# Patient Record
Sex: Female | Born: 1990 | Race: Black or African American | Hispanic: No | Marital: Single | Smoking: Current every day smoker
Health system: Southern US, Community
[De-identification: ages and names within clinical notes are randomized; demographics above are authoritative.]

## PROBLEM LIST (undated history)

## (undated) ENCOUNTER — Inpatient Hospital Stay (HOSPITAL_COMMUNITY): Payer: Self-pay

## (undated) DIAGNOSIS — K429 Umbilical hernia without obstruction or gangrene: Secondary | ICD-10-CM

## (undated) DIAGNOSIS — I10 Essential (primary) hypertension: Secondary | ICD-10-CM

## (undated) DIAGNOSIS — J45909 Unspecified asthma, uncomplicated: Secondary | ICD-10-CM

## (undated) DIAGNOSIS — N83209 Unspecified ovarian cyst, unspecified side: Secondary | ICD-10-CM

## (undated) DIAGNOSIS — R569 Unspecified convulsions: Secondary | ICD-10-CM

## (undated) HISTORY — DX: Unspecified ovarian cyst, unspecified side: N83.209

## (undated) HISTORY — PX: NO PAST SURGERIES: SHX2092

---

## 2014-09-04 ENCOUNTER — Encounter (HOSPITAL_COMMUNITY): Payer: Self-pay | Admitting: *Deleted

## 2014-09-04 ENCOUNTER — Inpatient Hospital Stay (HOSPITAL_COMMUNITY): Payer: Medicaid Other

## 2014-09-04 ENCOUNTER — Inpatient Hospital Stay (HOSPITAL_COMMUNITY)
Admission: AD | Admit: 2014-09-04 | Discharge: 2014-09-04 | Disposition: A | Discharge status: Home / Self Care | Payer: Medicaid Other | Source: Ambulatory Visit | Attending: Obstetrics & Gynecology | Admitting: Obstetrics & Gynecology

## 2014-09-04 DIAGNOSIS — O9989 Other specified diseases and conditions complicating pregnancy, childbirth and the puerperium: Secondary | ICD-10-CM

## 2014-09-04 DIAGNOSIS — R109 Unspecified abdominal pain: Secondary | ICD-10-CM | POA: Diagnosis present

## 2014-09-04 DIAGNOSIS — O26899 Other specified pregnancy related conditions, unspecified trimester: Secondary | ICD-10-CM

## 2014-09-04 HISTORY — DX: Unspecified asthma, uncomplicated: J45.909

## 2014-09-04 LAB — WET PREP, GENITAL
Trich, Wet Prep: NONE SEEN
Yeast Wet Prep HPF POC: NONE SEEN

## 2014-09-04 LAB — URINALYSIS, ROUTINE W REFLEX MICROSCOPIC
Bilirubin Urine: NEGATIVE
Glucose, UA: NEGATIVE mg/dL
Ketones, ur: NEGATIVE mg/dL
Nitrite: NEGATIVE
Protein, ur: NEGATIVE mg/dL
SPECIFIC GRAVITY, URINE: 1.015 (ref 1.005–1.030)
UROBILINOGEN UA: 0.2 mg/dL (ref 0.0–1.0)
pH: 6 (ref 5.0–8.0)

## 2014-09-04 LAB — CBC
HCT: 36.1 % (ref 36.0–46.0)
Hemoglobin: 13.1 g/dL (ref 12.0–15.0)
MCH: 31.5 pg (ref 26.0–34.0)
MCHC: 36.3 g/dL — ABNORMAL HIGH (ref 30.0–36.0)
MCV: 86.8 fL (ref 78.0–100.0)
PLATELETS: 284 10*3/uL (ref 150–400)
RBC: 4.16 MIL/uL (ref 3.87–5.11)
RDW: 12.4 % (ref 11.5–15.5)
WBC: 11 10*3/uL — ABNORMAL HIGH (ref 4.0–10.5)

## 2014-09-04 LAB — URINE MICROSCOPIC-ADD ON

## 2014-09-04 LAB — ABO/RH: ABO/RH(D): O POS

## 2014-09-04 LAB — HIV ANTIBODY (ROUTINE TESTING W REFLEX): HIV 1&2 Ab, 4th Generation: NONREACTIVE

## 2014-09-04 LAB — HCG, QUANTITATIVE, PREGNANCY: hCG, Beta Chain, Quant, S: 56029 m[IU]/mL — ABNORMAL HIGH (ref <5)

## 2014-09-04 LAB — POCT PREGNANCY, URINE: Preg Test, Ur: POSITIVE — AB

## 2014-09-04 NOTE — MAU Note (Signed)
Pt had +HPT and been having low abd pain for the past three days. Denies dysuria, vag bleeding or discharge.  Took pamprin for the pain and it did not help.

## 2014-09-04 NOTE — MAU Provider Note (Signed)
Chief Complaint: No chief complaint on file.   First Provider Initiated Contact with Patient 09/04/14 0245     SUBJECTIVE HPI: Jackie Newman is a 23 y.o. G1P0 at 6w by LMP who presents to maternity admissions reporting positive HPT 1 week ago and abdominal cramping x 3 days.  She denies LOF, vaginal bleeding, vaginal itching/burning, urinary symptoms, h/a, dizziness, n/v, or fever/chills.    Past Medical History  Diagnosis Date  . Asthma    Past Surgical History  Procedure Laterality Date  . No past surgeries     History   Social History  . Marital Status: Single    Spouse Name: N/A    Number of Children: N/A  . Years of Education: N/A   Occupational History  . Not on file.   Social History Main Topics  . Smoking status: Never Smoker   . Smokeless tobacco: Not on file  . Alcohol Use: No  . Drug Use: No  . Sexual Activity: Yes     Comment: last ilntercourse 6 weeks ago   Other Topics Concern  . Not on file   Social History Narrative  . No narrative on file   No current facility-administered medications on file prior to encounter.   No current outpatient prescriptions on file prior to encounter.   No Known Allergies  ROS: Pertinent items in HPI  OBJECTIVE Last menstrual period 07/19/2014. GENERAL: Well-developed, well-nourished female in no acute distress.  HEENT: Normocephalic HEART: normal rate RESP: normal effort ABDOMEN: Soft, non-tender EXTREMITIES: Nontender, no edema NEURO: Alert and oriented Pelvic exam: Cervix pink, visually closed, without lesion, scant white creamy discharge, vaginal walls and external genitalia normal Bimanual exam: Cervix 0/long/high, firm, anterior, neg CMT, uterus nontender, nonenlarged, adnexa without tenderness, enlargement, or mass  LAB RESULTS Results for orders placed during the hospital encounter of 09/04/14 (from the past 24 hour(s))  URINALYSIS, ROUTINE W REFLEX MICROSCOPIC     Status: Abnormal   Collection Time     09/04/14  1:00 AM      Result Value Ref Range   Color, Urine YELLOW  YELLOW   APPearance CLEAR  CLEAR   Specific Gravity, Urine 1.015  1.005 - 1.030   pH 6.0  5.0 - 8.0   Glucose, UA NEGATIVE  NEGATIVE mg/dL   Hgb urine dipstick TRACE (*) NEGATIVE   Bilirubin Urine NEGATIVE  NEGATIVE   Ketones, ur NEGATIVE  NEGATIVE mg/dL   Protein, ur NEGATIVE  NEGATIVE mg/dL   Urobilinogen, UA 0.2  0.0 - 1.0 mg/dL   Nitrite NEGATIVE  NEGATIVE   Leukocytes, UA TRACE (*) NEGATIVE  URINE MICROSCOPIC-ADD ON     Status: Abnormal   Collection Time    09/04/14  1:00 AM      Result Value Ref Range   Squamous Epithelial / LPF FEW (*) RARE   WBC, UA 0-2  <3 WBC/hpf   Bacteria, UA FEW (*) RARE   Urine-Other MUCOUS PRESENT    POCT PREGNANCY, URINE     Status: Abnormal   Collection Time    09/04/14  1:12 AM      Result Value Ref Range   Preg Test, Ur POSITIVE (*) NEGATIVE  WET PREP, GENITAL     Status: Abnormal   Collection Time    09/04/14  2:45 AM      Result Value Ref Range   Yeast Wet Prep HPF POC NONE SEEN  NONE SEEN   Trich, Wet Prep NONE SEEN  NONE SEEN  Clue Cells Wet Prep HPF POC FEW (*) NONE SEEN   WBC, Wet Prep HPF POC FEW (*) NONE SEEN  CBC     Status: Abnormal   Collection Time    09/04/14  3:15 AM      Result Value Ref Range   WBC 11.0 (*) 4.0 - 10.5 K/uL   RBC 4.16  3.87 - 5.11 MIL/uL   Hemoglobin 13.1  12.0 - 15.0 g/dL   HCT 46.9  62.9 - 52.8 %   MCV 86.8  78.0 - 100.0 fL   MCH 31.5  26.0 - 34.0 pg   MCHC 36.3 (*) 30.0 - 36.0 g/dL   RDW 41.3  24.4 - 01.0 %   Platelets 284  150 - 400 K/uL  HCG, QUANTITATIVE, PREGNANCY     Status: Abnormal   Collection Time    09/04/14  3:15 AM      Result Value Ref Range   hCG, Beta Chain, Sharene Butters, [place] [address] (*) <5 mIU/mL  ABO/RH     Status: None   Collection Time    09/04/14  3:15 AM      Result Value Ref Range   ABO/RH(D) O POS      IMAGING US Ob Comp Less 14 Wks  09/04/2014   CLINICAL DATA:  Acute onset of lower abdominal pain for  3 days. Initial encounter.  EXAM: OBSTETRIC <14 WK [place] AND TRANSVAGINAL OB US  TECHNIQUE: Both transabdominal and transvaginal ultrasound examinations were performed for complete evaluation of the gestation as well as the maternal uterus, adnexal regions, and pelvic cul-de-sac. Transvaginal technique was performed to assess early pregnancy.  COMPARISON:  None.  FINDINGS: Intrauterine gestational sac: Visualized/normal in shape.  Yolk sac:  Yes  Embryo:  Yes  Cardiac Activity: Yes  Heart Rate: 167 bpm  CRL:   1.62 cm   8 w 1 d                  [place] EDC: 04/15/2015  Maternal uterus/adnexae: The uterus is unremarkable in appearance. No subchorionic hemorrhage is seen.  The ovaries are within normal limits. The right ovary measures 3.1 x 1.9 x 1.8 cm, while the left ovary measures 3.2 x 1.5 x 1.8 cm. No suspicious adnexal masses are seen; there is no evidence for ovarian torsion.  No free fluid is seen within the pelvic cul-de-sac.  IMPRESSION: Single live intrauterine pregnancy noted, with a crown-rump length of 1.6 cm, corresponding to a gestational age of [redacted] weeks 1 day. This does not match the gestational age by LMP, and reflects a new estimated date of delivery of Apr 15, 2015.   Electronically Signed   By: Roanna Raider M.D.   On: 09/04/2014 04:33   US Ob Transvaginal  09/04/2014   CLINICAL DATA:  Acute onset of lower abdominal pain for 3 days. Initial encounter.  EXAM: OBSTETRIC <14 WK [place] AND TRANSVAGINAL OB US  TECHNIQUE: Both transabdominal and transvaginal ultrasound examinations were performed for complete evaluation of the gestation as well as the maternal uterus, adnexal regions, and pelvic cul-de-sac. Transvaginal technique was performed to assess early pregnancy.  COMPARISON:  None.  FINDINGS: Intrauterine gestational sac: Visualized/normal in shape.  Yolk sac:  Yes  Embryo:  Yes  Cardiac Activity: Yes  Heart Rate: 167 bpm  CRL:   1.62 cm   8 w 1 d                  [place] EDC: 04/15/2015  Maternal  uterus/adnexae: The uterus is unremarkable in appearance. No subchorionic hemorrhage is seen.  The ovaries are within normal limits. The right ovary measures 3.1 x 1.9 x 1.8 cm, while the left ovary measures 3.2 x 1.5 x 1.8 cm. No suspicious adnexal masses are seen; there is no evidence for ovarian torsion.  No free fluid is seen within the pelvic cul-de-sac.  IMPRESSION: Single live intrauterine pregnancy noted, with a crown-rump length of 1.6 cm, corresponding to a gestational age of [redacted] weeks 1 day. This does not match the gestational age by LMP, and reflects a new estimated date of delivery of Apr 15, 2015.   Electronically Signed   By: Roanna RaiderJeffery  Chang M.D.   On: 09/04/2014 04:33    ASSESSMENT 1. Abdominal pain in pregnancy     PLAN Discharge home F/U with early prenatal care Tylenol, heating pad, warm bath for pain  Follow-up Information   Follow [facility] with [facility] [place]. (Or prenatal provider of your choice)    Contact information:   [address] [place] [address] [phone]      Follow [facility] with [facility]. (As needed for emergencies)    Contact information:   [address] [id number] [place] [address] [phone]      Sharen CounterLisa Leftwich-Kirby Certified Nurse-Midwife 09/04/2014  4:29 AM

## 2014-09-04 NOTE — Discharge Instructions (Signed)

## 2014-09-05 LAB — GC/CHLAMYDIA PROBE AMP
CT Probe RNA: NEGATIVE
GC PROBE AMP APTIMA: NEGATIVE

## 2014-09-06 NOTE — MAU Provider Note (Signed)
Attestation of Attending Supervision of Advanced Practitioner (CNM/NP): Evaluation and management procedures were performed by the Advanced Practitioner under my supervision and collaboration. I have reviewed the Advanced Practitioner's note and chart, and I agree with the management and plan.  Ewa Hipp H. 10:27 AM   

## 2014-10-01 ENCOUNTER — Inpatient Hospital Stay (HOSPITAL_COMMUNITY)
Admission: AD | Admit: 2014-10-01 | Discharge: 2014-10-02 | Disposition: A | Discharge status: Home / Self Care | Payer: Medicaid Other | Source: Ambulatory Visit | Attending: Obstetrics & Gynecology | Admitting: Obstetrics & Gynecology

## 2014-10-01 ENCOUNTER — Inpatient Hospital Stay (HOSPITAL_COMMUNITY): Payer: Medicaid Other

## 2014-10-01 ENCOUNTER — Encounter (HOSPITAL_COMMUNITY): Payer: Self-pay | Admitting: *Deleted

## 2014-10-01 DIAGNOSIS — O26851 Spotting complicating pregnancy, first trimester: Secondary | ICD-10-CM

## 2014-10-01 DIAGNOSIS — Z3A12 12 weeks gestation of pregnancy: Secondary | ICD-10-CM | POA: Diagnosis not present

## 2014-10-01 DIAGNOSIS — O021 Missed abortion: Secondary | ICD-10-CM | POA: Insufficient documentation

## 2014-10-01 NOTE — MAU Note (Signed)
PT  SAYS  WAS HAVING SEX THIS  AM   AT 11 00-  STOPPED    BECAUSE  HE WAS AFRAID HE WAS GOING TO HURT BABY.   WHEN SHE WENT TO B-ROOM-  PINK  ON TOILET PAPER  THEN STOPPED.     THEN AT 10PM-  IN B-ROOM-  WAS  RED BLOOD ON PANTY LINER AND   SAME RED WHEN WIPED .    NOW IN TRIAGE - PANTY LINER     -   BROWN SPOTS.     HAD CRAMPS EARLIER-  NONE NOW.   NO PNC- OR APPOINTMENT

## 2014-10-01 NOTE — MAU Provider Note (Signed)
History     CSN: [id number]  Arrival date and time: 10/01/14 2250   First Provider Initiated Contact with Patient 10/01/14 2319      No chief complaint on file.  HPI  Jackie Newman is a [age] y.o. G1P0 at 6930w0d who presents today with spotting. She states that she had intercourse earlier in the day, and around 1100 this morning she started to have spotting. She states that she has had to wear a panti-liner. She states that the spotting has been pink, and then red and then brown. She denies any pain at this time. She had a US with confirmed IUP on 09/04/14.   Past Medical History  Diagnosis Date  . Asthma     Past Surgical History  Procedure Laterality Date  . No past surgeries      No family history on file.  History  Substance Use Topics  . Smoking status: Never Smoker   . Smokeless tobacco: Not on file  . Alcohol Use: No    Allergies: No Known Allergies  No prescriptions prior to admission    ROS Physical Exam   Blood pressure 127/75, pulse 93, temperature 99.4 F (37.4 C), temperature source Oral, resp. rate 18, height 5\' 2"  (1.575 m), weight 77.168 kg (170 lb 2 oz), last menstrual period 07/19/2014.  Physical Exam  Nursing note and vitals reviewed. Constitutional: She is oriented to person, place, and time. She appears well-developed and well-nourished. No distress.  Cardiovascular: Normal rate.   Respiratory: Effort normal.  GI: Soft. There is no tenderness.  Genitourinary:   External: no lesion Vagina: small amount of white discharge. No blood seen  Cervix: pink, smooth, no CMT Uterus: slightly enlarged, unable to doppler FHT with doppler  Adnexa: NT   Neurological: She is alert and oriented to person, place, and time.  Skin: Skin is warm and dry.  Psychiatric: She has a normal mood and affect.    MAU Course  Procedures  [place] Ob Comp Less 14 Wks  10/02/2014   CLINICAL DATA:  Vaginal spotting.  EXAM: OBSTETRIC <14 WK US AND TRANSVAGINAL OB US   TECHNIQUE: Both transabdominal and transvaginal ultrasound examinations were performed for complete evaluation of the gestation as well as the maternal uterus, adnexal regions, and pelvic cul-de-sac. Transvaginal technique was performed to assess early pregnancy.  COMPARISON:  09/04/2014  FINDINGS: Intrauterine gestational sac: Present  Yolk sac:  Not present  Embryo:  Present  Cardiac Activity: Not present  CRL:   22.7  mm   9 w 0 d  Maternal uterus/adnexae: There is an intrauterine gestational sac with a large amount of fluid. There is an embryo but no cardiac activity. Moderate-sized subchorionic hemorrhage. Hemorrhage measures 2.6 x 1.5 x 1.4 cm. Normal appearance of the left ovary measuring 2.2 x 1.6 x 1.9 cm. Normal appearance of the right ovary measuring 1.5 x 1.3 x 1.4 cm. No significant free fluid.  IMPRESSION: Failed pregnancy.  Normal appearance of the ovaries.   Electronically Signed   By: Richarda OverlieAdam  Henn M.D.   On: 10/02/2014 00:26   [place] Ob Transvaginal  10/02/2014   CLINICAL DATA:  Vaginal spotting.  EXAM: OBSTETRIC <14 WK US AND TRANSVAGINAL OB US  TECHNIQUE: Both transabdominal and transvaginal ultrasound examinations were performed for complete evaluation of the gestation as well as the maternal uterus, adnexal regions, and pelvic cul-de-sac. Transvaginal technique was performed to assess early pregnancy.  COMPARISON:  09/04/2014  FINDINGS: Intrauterine gestational sac: Present  Yolk sac:  Not present  Embryo:  Present  Cardiac Activity: Not present  CRL:   22.7  mm   9 w 0 d  Maternal uterus/adnexae: There is an intrauterine gestational sac with a large amount of fluid. There is an embryo but no cardiac activity. Moderate-sized subchorionic hemorrhage. Hemorrhage measures 2.6 x 1.5 x 1.4 cm. Normal appearance of the left ovary measuring 2.2 x 1.6 x 1.9 cm. Normal appearance of the right ovary measuring 1.5 x 1.3 x 1.4 cm. No significant free fluid.  IMPRESSION: Failed pregnancy.  Normal appearance of  the ovaries.   Electronically Signed   By: Richarda OverlieAdam  Henn M.D.   On: 10/02/2014 00:26   Results for orders placed or performed during the hospital encounter of 10/01/14 (from the past 24 hour(s))  CBC     Status: Abnormal   Collection Time: 10/02/14 12:34 AM  Result Value Ref Range   WBC 10.7 (H) 4.0 - 10.5 K/uL   RBC 3.87 3.87 - 5.11 MIL/uL   Hemoglobin 12.0 12.0 - 15.0 g/dL   HCT 16.134.1 (L) 09.636.0 - 04.546.0 %   MCV 88.1 78.0 - 100.0 fL   MCH 31.0 26.0 - 34.0 pg   MCHC 35.2 30.0 - 36.0 g/dL   RDW 40.913.2 81.111.5 - 91.415.5 %   Platelets 261 150 - 400 K/uL        Early Intrauterine Pregnancy Failure  x  Documented intrauterine pregnancy failure less than or equal to [redacted] weeks gestation  x  No serious current illness  x Baseline Hgb greater than or equal to 10g/dl  x  Patient has easily accessible transportation to the hospital  x  Clear preference  x  Practitioner/physician deems patient reliable  x  Counseling by practitioner or physician  x  Patient education by RN  ___  Consent form signed  NA  Rho-Gam given by RN if indicated  ___ Medication dispensed   x  Cytotec 800 mcg  __   Intravaginally by patient at home         __   Intravaginally by RN in MAU        x   Buccally  by patient at home        __   Rectally by RN in MAU  x Ibuprofen 600 mg 1 tablet by mouth every 6 hours as needed #30  x  Hydrocodone/acetaminophen 5/325 mg by mouth every 4 to 6 hours as needed  x  Phenergan 12.5 mg by mouth every 4 hours as needed for nausea     Assessment and Plan   1. Missed abortion   2. Spotting affecting pregnancy in first trimester    counseled patient on Cytotec v expectant management. R/B discussed, patient would like to proceed with Cytotec. She wants RX for home use.   Bleeding precautions  Return to MAU as needed  Follow-up Information    [facility]   Specialty:  Obstetrics and Gynecology   Why:  [facility]   Contact  information:   [address] [place] [id number] [phone]       Tawnya CrookHogan, Heather Donovan 10/01/2014, 11:20 PM

## 2014-10-02 ENCOUNTER — Encounter (HOSPITAL_COMMUNITY): Payer: Self-pay | Admitting: *Deleted

## 2014-10-02 ENCOUNTER — Telehealth: Payer: Self-pay | Admitting: *Deleted

## 2014-10-02 DIAGNOSIS — O021 Missed abortion: Secondary | ICD-10-CM

## 2014-10-02 LAB — CBC
HCT: 34.1 % — ABNORMAL LOW (ref 36.0–46.0)
Hemoglobin: 12 g/dL (ref 12.0–15.0)
MCH: 31 pg (ref 26.0–34.0)
MCHC: 35.2 g/dL (ref 30.0–36.0)
MCV: 88.1 fL (ref 78.0–100.0)
PLATELETS: 261 10*3/uL (ref 150–400)
RBC: 3.87 MIL/uL (ref 3.87–5.11)
RDW: 13.2 % (ref 11.5–15.5)
WBC: 10.7 10*3/uL — ABNORMAL HIGH (ref 4.0–10.5)

## 2014-10-02 MED ORDER — HYDROCODONE-ACETAMINOPHEN 5-325 MG PO TABS: 1.0000 | ORAL_TABLET | Freq: Four times a day (QID) | ORAL | Status: DC | PRN

## 2014-10-02 MED ORDER — MISOPROSTOL 200 MCG PO TABS: 800.0000 ug | ORAL_TABLET | Freq: Once | ORAL | Status: DC

## 2014-10-02 MED ORDER — PROMETHAZINE HCL 25 MG PO TABS: 12.5000 mg | ORAL_TABLET | Freq: Four times a day (QID) | ORAL | Status: DC | PRN

## 2014-10-02 NOTE — Discharge Instructions (Signed)
Miscarriage A miscarriage is the sudden loss of an unborn baby (fetus) before the 20th week of pregnancy. Most miscarriages happen in the first 3 months of pregnancy. Sometimes, it happens before a woman even knows she is pregnant. A miscarriage is also called a "spontaneous miscarriage" or "early pregnancy loss." Having a miscarriage can be an emotional experience. Talk with your caregiver about any questions you may have about miscarrying, the grieving process, and your future pregnancy plans. CAUSES   Problems with the fetal chromosomes that make it impossible for the baby to develop normally. Problems with the baby's genes or chromosomes are most often the result of errors that occur, by chance, as the embryo divides and grows. The problems are not inherited from the parents.  Infection of the cervix or uterus.   Hormone problems.   Problems with the cervix, such as having an incompetent cervix. This is when the tissue in the cervix is not strong enough to hold the pregnancy.   Problems with the uterus, such as an abnormally shaped uterus, uterine fibroids, or congenital abnormalities.   Certain medical conditions.   Smoking, drinking alcohol, or taking illegal drugs.   Trauma.  Often, the cause of a miscarriage is unknown.  SYMPTOMS   Vaginal bleeding or spotting, with or without cramps or pain.  Pain or cramping in the abdomen or lower back.  Passing fluid, tissue, or blood clots from the vagina. DIAGNOSIS  Your caregiver will perform a physical exam. You may also have an ultrasound to confirm the miscarriage. Blood or urine tests may also be ordered. TREATMENT   Sometimes, treatment is not necessary if you naturally pass all the fetal tissue that was in the uterus. If some of the fetus or placenta remains in the body (incomplete miscarriage), tissue left behind may become infected and must be removed. Usually, a dilation and curettage (D and C) procedure is performed.  During a D and C procedure, the cervix is widened (dilated) and any remaining fetal or placental tissue is gently removed from the uterus.  Antibiotic medicines are prescribed if there is an infection. Other medicines may be given to reduce the size of the uterus (contract) if there is a lot of bleeding.  If you have Rh negative blood and your baby was Rh positive, you will need a Rh immunoglobulin shot. This shot will protect any future baby from having Rh blood problems in future pregnancies. HOME CARE INSTRUCTIONS   Your caregiver may order bed rest or may allow you to continue light activity. Resume activity as directed by your caregiver.  Have someone help with home and family responsibilities during this time.   Keep track of the number of sanitary pads you use each day and how soaked (saturated) they are. Write down this information.   Do not use tampons. Do not douche or have sexual intercourse until approved by your caregiver.   Only take over-the-counter or prescription medicines for pain or discomfort as directed by your caregiver.   Do not take aspirin. Aspirin can cause bleeding.   Keep all follow-up appointments with your caregiver.   If you or your partner have problems with grieving, talk to your caregiver or seek counseling to help cope with the pregnancy loss. Allow enough time to grieve before trying to get pregnant again.  SEEK IMMEDIATE MEDICAL CARE IF:   You have severe cramps or pain in your back or abdomen.  You have a fever.  You pass large blood clots (walnut-sized   or larger) ortissue from your vagina. Save any tissue for your caregiver to inspect.   Your bleeding increases.   You have a thick, bad-smelling vaginal discharge.  You become lightheaded, weak, or you faint.   You have chills.  MAKE SURE YOU:  Understand these instructions.  Will watch your condition.  Will get help right away if you are not doing well or get  worse. Document Released: 05/12/2001 Document Revised: 03/13/2013 Document Reviewed: 01/05/2012 Exit[employer] Patient Information 2015 [employer]. This information is not intended to replace advice given to you by your health care provider. Make sure you discuss any questions you have with your health care provider.  

## 2014-10-02 NOTE — Telephone Encounter (Signed)
Pt called and request a callback from nurse regarding miscarriage.  Pt gives verbal permission for nurse to speak with her mother.  Contacted patient, discussed with mother events of last two encounters in MAU.  Questions answered. Mother request photos of ultrasound on 10/01/2014.  Information forwarded to MAU by K. Rassette RNC, Facilities managernurse manager.  Supervisor to contact patient, pt verbalizes understanding.

## 2014-10-04 ENCOUNTER — Inpatient Hospital Stay (HOSPITAL_COMMUNITY): Payer: Medicaid Other

## 2014-10-04 ENCOUNTER — Inpatient Hospital Stay (HOSPITAL_COMMUNITY)
Admission: AD | Admit: 2014-10-04 | Discharge: 2014-10-04 | Disposition: A | Discharge status: Home / Self Care | Payer: Medicaid Other | Source: Ambulatory Visit | Attending: Obstetrics and Gynecology | Admitting: Obstetrics and Gynecology

## 2014-10-04 ENCOUNTER — Encounter (HOSPITAL_COMMUNITY): Payer: Self-pay | Admitting: *Deleted

## 2014-10-04 DIAGNOSIS — O021 Missed abortion: Secondary | ICD-10-CM

## 2014-10-04 DIAGNOSIS — O039 Complete or unspecified spontaneous abortion without complication: Secondary | ICD-10-CM

## 2014-10-04 LAB — CBC
HCT: 36.9 % (ref 36.0–46.0)
Hemoglobin: 12.5 g/dL (ref 12.0–15.0)
MCH: 30 pg (ref 26.0–34.0)
MCHC: 33.9 g/dL (ref 30.0–36.0)
MCV: 88.5 fL (ref 78.0–100.0)
PLATELETS: 264 10*3/uL (ref 150–400)
RBC: 4.17 MIL/uL (ref 3.87–5.11)
RDW: 13.2 % (ref 11.5–15.5)
WBC: 9.5 10*3/uL (ref 4.0–10.5)

## 2014-10-04 LAB — HCG, QUANTITATIVE, PREGNANCY: HCG, BETA CHAIN, QUANT, S: 1462 m[IU]/mL — AB (ref <5)

## 2014-10-04 NOTE — MAU Provider Note (Signed)
Chief Complaint: Miscarriage   First Provider Initiated Contact with Patient 10/04/14 1740     SUBJECTIVE HPI: Jackie StaggersShantell Newman is a 23 y.o. G1P0 dx 3 d ago with missed Ab 9wks by CRL who presents with small amount bleeding after passing what she thinks was white tissue today. Had minimal cramping and no pain or cramps now. Was planning to get cytotec Rx filled today. Mod SCH on US.  Past Medical History  Diagnosis Date  . Asthma    OB History  Gravida Para Term Preterm AB SAB TAB Ectopic Multiple Living  1             # Outcome Date GA Lbr Len/2nd Weight Sex Delivery Anes PTL Lv  1 Current              Past Surgical History  Procedure Laterality Date  . No past surgeries     History   Social History  . Marital Status: Single    Spouse Name: N/A    Number of Children: N/A  . Years of Education: N/A   Occupational History  . Not on file.   Social History Main Topics  . Smoking status: Never Smoker   . Smokeless tobacco: Not on file  . Alcohol Use: No  . Drug Use: No  . Sexual Activity: Yes     Comment: last ilntercourse 6 weeks ago   Other Topics Concern  . Not on file   Social History Narrative   No current facility-administered medications on file prior to encounter.   Current Outpatient Prescriptions on File Prior to Encounter  Medication Sig Dispense Refill  . HYDROcodone-acetaminophen (NORCO/VICODIN) 5-325 MG per tablet Take 1-2 tablets by mouth every 6 (six) hours as needed for moderate pain. 20 tablet 0  . misoprostol (CYTOTEC) 200 MCG tablet Take 4 tablets (800 mcg total) by mouth once. 4 tablet 0  . promethazine (PHENERGAN) 25 MG tablet Take 0.5-1 tablets (12.5-25 mg total) by mouth every 6 (six) hours as needed. 30 tablet 0   No Known Allergies  ROS: Pertinent items in HPI  OBJECTIVE Blood pressure 116/72, pulse 84, temperature 99 F (37.2 C), temperature source Oral, resp. rate 18, height 5\' 2"  (1.575 m), weight 74.753 kg (164 lb 12.8 oz), last  menstrual period 07/19/2014, SpO2 100 %. GENERAL: Well-developed, well-nourished female in no acute distress.  HEENT: Normocephalic HEART: normal rate RESP: normal effort ABDOMEN: Soft, non-tender EXTREMITIES: Nontender, no edema NEURO: Alert and oriented SPECULUM EXAM: NEFG, physiologic discharge, no blood noted, cervix clean BIMANUAL: cervix ext 1, int FT long; uterus 8wk size, no adnexal tenderness or masses  LAB RESULTS Results for orders placed or performed during the hospital encounter of 10/04/14 (from the past 24 hour(s))  CBC     Status: None   Collection Time: 10/04/14  4:58 PM  Result Value Ref Range   WBC 9.5 4.0 - 10.5 K/uL   RBC 4.17 3.87 - 5.11 MIL/uL   Hemoglobin 12.5 12.0 - 15.0 g/dL   HCT 09.836.9 11.936.0 - 14.746.0 %   MCV 88.5 78.0 - 100.0 fL   MCH 30.0 26.0 - 34.0 pg   MCHC 33.9 30.0 - 36.0 g/dL   RDW 82.913.2 56.211.5 - 13.015.5 %   Platelets 264 150 - 400 K/uL    IMAGING [place] Ob Comp Less 14 Wks  10/02/2014   CLINICAL DATA:  Vaginal spotting.  EXAM: OBSTETRIC <14 WK US AND TRANSVAGINAL OB US  TECHNIQUE: Both transabdominal and transvaginal ultrasound examinations  were performed for complete evaluation of the gestation as well as the maternal uterus, adnexal regions, and pelvic cul-de-sac. Transvaginal technique was performed to assess early pregnancy.  COMPARISON:  09/04/2014  FINDINGS: Intrauterine gestational sac: Present  Yolk sac:  Not present  Embryo:  Present  Cardiac Activity: Not present  CRL:   22.7  mm   9 w 0 d  Maternal uterus/adnexae: There is an intrauterine gestational sac with a large amount of fluid. There is an embryo but no cardiac activity. Moderate-sized subchorionic hemorrhage. Hemorrhage measures 2.6 x 1.5 x 1.4 cm. Normal appearance of the left ovary measuring 2.2 x 1.6 x 1.9 cm. Normal appearance of the right ovary measuring 1.5 x 1.3 x 1.4 cm. No significant free fluid.  IMPRESSION: Failed pregnancy.  Normal appearance of the ovaries.   Electronically Signed   By:  Richarda OverlieAdam  Henn M.D.   On: 10/02/2014 00:26   [place] Ob Transvaginal  10/02/2014   CLINICAL DATA:  Vaginal spotting.  EXAM: OBSTETRIC <14 WK US AND TRANSVAGINAL OB US  TECHNIQUE: Both transabdominal and transvaginal ultrasound examinations were performed for complete evaluation of the gestation as well as the maternal uterus, adnexal regions, and pelvic cul-de-sac. Transvaginal technique was performed to assess early pregnancy.  COMPARISON:  09/04/2014  FINDINGS: Intrauterine gestational sac: Present  Yolk sac:  Not present  Embryo:  Present  Cardiac Activity: Not present  CRL:   22.7  mm   9 w 0 d  Maternal uterus/adnexae: There is an intrauterine gestational sac with a large amount of fluid. There is an embryo but no cardiac activity. Moderate-sized subchorionic hemorrhage. Hemorrhage measures 2.6 x 1.5 x 1.4 cm. Normal appearance of the left ovary measuring 2.2 x 1.6 x 1.9 cm. Normal appearance of the right ovary measuring 1.5 x 1.3 x 1.4 cm. No significant free fluid.  IMPRESSION: Failed pregnancy.  Normal appearance of the ovaries.   Electronically Signed   By: Richarda OverlieAdam  Henn M.D.   On: 10/02/2014 00:26   US prelim> no evidence [facility]  MAU COURSE  ASSESSMENT 1. SAB (spontaneous abortion)   2. Missed abortion     PLAN Discharge home with reassurance SAB completed    Medication List    STOP taking these medications        misoprostol 200 MCG tablet  Commonly known as:  CYTOTEC      TAKE these medications        albuterol 108 (90 BASE) MCG/ACT inhaler  Commonly known as:  PROVENTIL HFA;VENTOLIN HFA  Inhale 2 puffs into the lungs every 6 (six) hours as needed for wheezing or shortness of breath.     HYDROcodone-acetaminophen 5-325 MG per tablet  Commonly known as:  NORCO/VICODIN  Take 1-2 tablets by mouth every 6 (six) hours as needed for moderate pain.     promethazine 25 MG tablet  Commonly known as:  PHENERGAN  Take 0.5-1 tablets (12.5-25 mg total) by mouth every 6 (six) hours as needed.        Follow-up Information    Follow up with [facility] On 10/15/2014.   Contact information:   [address] [place] [address] [phone]       Danae OrleansDeirdre C Lubertha Leite, CNM 10/04/2014  5:50 PM

## 2014-10-04 NOTE — Discharge Instructions (Signed)
Miscarriage A miscarriage is the sudden loss of an unborn baby (fetus) before the 20th week of pregnancy. Most miscarriages happen in the first 3 months of pregnancy. Sometimes, it happens before a woman even knows she is pregnant. A miscarriage is also called a "spontaneous miscarriage" or "early pregnancy loss." Having a miscarriage can be an emotional experience. Talk with your caregiver about any questions you may have about miscarrying, the grieving process, and your future pregnancy plans. CAUSES   Problems with the fetal chromosomes that make it impossible for the baby to develop normally. Problems with the baby's genes or chromosomes are most often the result of errors that occur, by chance, as the embryo divides and grows. The problems are not inherited from the parents.  Infection of the cervix or uterus.   Hormone problems.   Problems with the cervix, such as having an incompetent cervix. This is when the tissue in the cervix is not strong enough to hold the pregnancy.   Problems with the uterus, such as an abnormally shaped uterus, uterine fibroids, or congenital abnormalities.   Certain medical conditions.   Smoking, drinking alcohol, or taking illegal drugs.   Trauma.  Often, the cause of a miscarriage is unknown.  SYMPTOMS   Vaginal bleeding or spotting, with or without cramps or pain.  Pain or cramping in the abdomen or lower back.  Passing fluid, tissue, or blood clots from the vagina. DIAGNOSIS  Your caregiver will perform a physical exam. You may also have an ultrasound to confirm the miscarriage. Blood or urine tests may also be ordered. TREATMENT   Sometimes, treatment is not necessary if you naturally pass all the fetal tissue that was in the uterus. If some of the fetus or placenta remains in the body (incomplete miscarriage), tissue left behind may become infected and must be removed. Usually, a dilation and curettage (D and C) procedure is performed.  During a D and C procedure, the cervix is widened (dilated) and any remaining fetal or placental tissue is gently removed from the uterus.  Antibiotic medicines are prescribed if there is an infection. Other medicines may be given to reduce the size of the uterus (contract) if there is a lot of bleeding.  If you have Rh negative blood and your baby was Rh positive, you will need a Rh immunoglobulin shot. This shot will protect any future baby from having Rh blood problems in future pregnancies. HOME CARE INSTRUCTIONS   Your caregiver may order bed rest or may allow you to continue light activity. Resume activity as directed by your caregiver.  Have someone help with home and family responsibilities during this time.   Keep track of the number of sanitary pads you use each day and how soaked (saturated) they are. Write down this information.   Do not use tampons. Do not douche or have sexual intercourse until approved by your caregiver.   Only take over-the-counter or prescription medicines for pain or discomfort as directed by your caregiver.   Do not take aspirin. Aspirin can cause bleeding.   Keep all follow-up appointments with your caregiver.   If you or your partner have problems with grieving, talk to your caregiver or seek counseling to help cope with the pregnancy loss. Allow enough time to grieve before trying to get pregnant again.  SEEK IMMEDIATE MEDICAL CARE IF:   You have severe cramps or pain in your back or abdomen.  You have a fever.  You pass large blood clots (walnut-sized   or larger) ortissue from your vagina. Save any tissue for your caregiver to inspect.   Your bleeding increases.   You have a thick, bad-smelling vaginal discharge.  You become lightheaded, weak, or you faint.   You have chills.  MAKE SURE YOU:  Understand these instructions.  Will watch your condition.  Will get help right away if you are not doing well or get  worse. Document Released: 05/12/2001 Document Revised: 03/13/2013 Document Reviewed: 01/05/2012 Exit[employer] Patient Information 2015 [employer]. This information is not intended to replace advice given to you by your health care provider. Make sure you discuss any questions you have with your health care provider.  

## 2014-10-04 NOTE — MAU Note (Addendum)
Patient states she has been diagnosed with a failed pregnancy on 11-2. States she passed what she thinks might have been the pregnancy today and is having light bleeding. No pain at this time.

## 2014-10-11 ENCOUNTER — Telehealth: Payer: Self-pay | Admitting: *Deleted

## 2014-10-11 NOTE — Telephone Encounter (Signed)
Patient called and left message stating she was here last Thursday and thinks that the nurse who drew her blood didn't know what she was doing because now she has a penny sized knot in the crook of her arm where they drew blood and it is quite painful and she has never had this happen before. Patient had blood drawn in MAU. Called patient back and recommended she take tylenol or ibuprofen and try a heat pack or warm washcloth and reminded patient of appt in our office on 11/16 and provided directions. Patient verbalized understanding to all and had no other questions

## 2014-10-15 ENCOUNTER — Encounter: Payer: Self-pay | Admitting: Family Medicine

## 2014-10-15 ENCOUNTER — Ambulatory Visit (INDEPENDENT_AMBULATORY_CARE_PROVIDER_SITE_OTHER): Payer: Self-pay | Admitting: Family Medicine

## 2014-10-15 VITALS — BP 125/74 | HR 88 | Temp 98.7°F | Wt 170.9 lb

## 2014-10-15 DIAGNOSIS — O039 Complete or unspecified spontaneous abortion without complication: Secondary | ICD-10-CM

## 2014-10-15 DIAGNOSIS — M79602 Pain in left arm: Secondary | ICD-10-CM

## 2014-10-15 NOTE — Progress Notes (Signed)
   Subjective:    Patient ID: Jackie Newman, female    DOB: 11/01/1991, 23 y.o.   MRN: [id number]  HPI Patient seen for follow up of SAB.  Had US in MAU - showed completed AB.  Has no bleeding.  Does not want anything for birth control.  Does have pain in the left antecubital fossa from IV stick in MAU.  Has been using tylenol and heat, without improvement.  Worse when lifting boxes.   Review of Systems  Constitutional: Negative for fever, chills and fatigue.  Respiratory: Negative for cough and wheezing.   Gastrointestinal: Negative for nausea, vomiting, abdominal pain, diarrhea and constipation.  Genitourinary: Negative for dysuria, vaginal bleeding, vaginal discharge and vaginal pain.  Neurological: Negative for tremors, weakness and numbness.       Objective:   Physical Exam  Constitutional: She is oriented to person, place, and time. She appears well-developed and well-nourished.  HENT:  Head: Normocephalic and atraumatic.  Cardiovascular: Normal rate, regular rhythm and normal heart sounds.   Pulmonary/Chest: Effort normal and breath sounds normal. No respiratory distress. She has no wheezes. She has no rales. She exhibits no tenderness.  Abdominal: Soft. Bowel sounds are normal. She exhibits no distension and no mass. There is no tenderness. There is no rebound and no guarding.  Musculoskeletal:  Slight lump in left antecubital fossa that is tender to the touch.  Neurological: She is alert and oriented to person, place, and time.  Strength 5/5 in upper extremities.  Skin: Skin is warm and dry. No rash noted. No erythema. No pallor.  Psychiatric: She has a normal mood and affect. Her behavior is normal. Judgment and thought content normal.          Assessment & Plan:   Problem List Items Addressed This Visit    None    Visit Diagnoses    Complete spontaneous abortion    -  Primary    Pain of left upper extremity          1.  Complete SAB - discussed timing of  next pregnancy - should wait at least 6-12 months.  Pt declines contraception 2.  Arm pain - likely hematoma, possible calcification from blood draw.  Recommended NSAIDs for inflammation and pain.  Should gradually resolve.

## 2014-10-15 NOTE — Progress Notes (Signed)
Pt denies bleeding at this time. Is concerned about last blood draw, knot in left arm.

## 2014-10-31 ENCOUNTER — Telehealth: Payer: Self-pay | Admitting: *Deleted

## 2014-10-31 NOTE — Telephone Encounter (Signed)
Jackie Newman called and left a message that she is having pain in her arm from where the nurse hit a nerve in her arm. States her job wants documentation. States she can't lift anything at work.

## 2014-10-31 NOTE — Telephone Encounter (Signed)
Called [facility] and discussed that we cannot give her a note for work stating she cannot lift her arm . We discussed she must be seen again- offered her a work in appointment for tomorrow afternoon- she states she cannot come then because she is working - asked for Saturday appointment- infomred her we are closed then. She asked for Monday appointment- put her on hold while I checked with registars- when I came back she had hung up. Called her back and let a message for her to call us back to discuss appointment date/time. Per registars could be seen 11/07/14 at 200 ; otherwise go to MAU.

## 2014-11-01 NOTE — Telephone Encounter (Signed)
Jackie Newman called back and left a message she was calling back to reschedule appointment and her call was cut off yesterday.  Called Jackie Newman and left a message we got her message and to call back to the appointment line and registrars would make appointment to work her in next week.

## 2014-11-04 ENCOUNTER — Emergency Department (HOSPITAL_COMMUNITY)
Admission: EM | Admit: 2014-11-04 | Discharge: 2014-11-04 | Disposition: A | Discharge status: Home / Self Care | Payer: Medicaid Other | Attending: Emergency Medicine | Admitting: Emergency Medicine

## 2014-11-04 ENCOUNTER — Encounter (HOSPITAL_COMMUNITY): Payer: Self-pay | Admitting: *Deleted

## 2014-11-04 DIAGNOSIS — J45909 Unspecified asthma, uncomplicated: Secondary | ICD-10-CM | POA: Insufficient documentation

## 2014-11-04 DIAGNOSIS — M79602 Pain in left arm: Secondary | ICD-10-CM | POA: Diagnosis present

## 2014-11-04 NOTE — ED Notes (Signed)
Declined W/C at D/C and was escorted to lobby by RN. 

## 2014-11-04 NOTE — ED Provider Notes (Signed)
CSN: [id number]     Arrival date & time 11/04/14  1538 History  This chart was scribed for Mellody DrownLauren Percell Lamboy, PA-C, working with Toy CookeyMegan Docherty, MD by Chestine SporeSoijett Blue, ED Scribe. The patient was seen in room TR06C/TR06C at 4:33 PM.    Chief Complaint  Patient presents with  . Arm Pain      The history is provided by the patient. No language interpreter was used.   HPI Comments: Jackie StaggersShantell Newman is a 23 y.o. female who presents to the Emergency Department complaining of left arm pain onset 1 month ago. She had blood drawn on 10/05/14. There was pain after she got the blood drawn. When the blood was drawn she jumped because of pain. She reports having an intermittent sharp pain going down her arm. Discomfort with movement of upper extremity. She denies fever, chills, redness, and any other symptoms. She denies currently being pregnant, she recently had a miscarriage.   Past Medical History  Diagnosis Date  . Asthma    Past Surgical History  Procedure Laterality Date  . No past surgeries     History reviewed. No pertinent family history. History  Substance Use Topics  . Smoking status: Never Smoker   . Smokeless tobacco: Not on file  . Alcohol Use: No   OB History    Gravida Para Term Preterm AB TAB SAB Ectopic Multiple Living   1              Review of Systems  Constitutional: Negative for fever and chills.  Musculoskeletal: Positive for myalgias.  Skin: Negative for color change.      Allergies  Review of patient's allergies indicates no known allergies.  Home Medications   Prior to Admission medications   Medication Sig Start Date End Date Taking? Authorizing Provider  albuterol (PROVENTIL HFA;VENTOLIN HFA) 108 (90 BASE) MCG/ACT inhaler Inhale 2 puffs into the lungs every 6 (six) hours as needed for wheezing or shortness of breath.    Historical Provider, MD  HYDROcodone-acetaminophen (NORCO/VICODIN) 5-325 MG per tablet Take 1-2 tablets by mouth every 6 (six) hours as needed  for moderate pain. 10/02/14   Heather Alger Memosonovan Hogan, CNM  promethazine (PHENERGAN) 25 MG tablet Take 0.5-1 tablets (12.5-25 mg total) by mouth every 6 (six) hours as needed. 10/02/14   Heather Alger Memosonovan Hogan, CNM   BP 143/69 mmHg  Pulse 88  Temp(Src) 97.9 F (36.6 C) (Oral)  Resp 18  SpO2 100%  LMP 07/19/2014  Breastfeeding? Unknown Physical Exam  Constitutional: She is oriented to person, place, and time. She appears well-developed and well-nourished. No distress.  HENT:  Head: Normocephalic and atraumatic.  Eyes: EOM are normal.  Neck: Neck supple.  Cardiovascular:  Left UE: 1x1 cm area of swelling with tenderness antecubital area. No overlaying erythema, ecchymosis, no lesions. Non-tender. Full ROM. Good cap refill.   Pulmonary/Chest: Effort normal. No respiratory distress.  Musculoskeletal: Normal range of motion.  Neurological: She is alert and oriented to person, place, and time.  Skin: Skin is warm and dry.  Psychiatric: She has a normal mood and affect. Her behavior is normal.  Nursing note and vitals reviewed.   ED Course  Procedures (including critical care time) COORDINATION OF CARE: 4:37 PM-Discussed treatment plan which includes ibuprofen, heat/cold with pt at bedside and pt agreed to plan.   Labs Review Labs Reviewed - No data to display  Imaging Review No results found.   EKG Interpretation None      MDM   Final  diagnoses:  Left arm pain   Patient presents with left antecubital discomfort after blood draw. Likely nerve irritation from phlebotomy needle. Plan to treat with anti-inflammatories and heat. No sign of infection. Discussed treatment plan with the patient. Return precautions given. Reports understanding and no other concerns at this time.  Patient is stable for discharge at this time.   I personally performed the services described in this documentation, which was scribed in my presence. The recorded information has been reviewed and is  accurate.    Mellody DrownLauren Yoshi Vicencio, PA-C 11/05/14 [id number]  Toy CookeyMegan Docherty, MD 11/05/14 1308

## 2014-11-04 NOTE — Discharge Instructions (Signed)
Warm compress on left arm to decrease discomfort. Call for a follow up appointment with a Family or Primary Care Provider.  Return if Symptoms worsen.   Take medication as prescribed.  Take Ibuprofen 800mg  3 times a day.

## 2014-11-04 NOTE — ED Notes (Signed)
Pt in c/o pain to left antecubital area of arm that radiates down into her hand, states [facility] in her arm. Pain is worse with movement or trying to pick something up.

## 2015-02-03 ENCOUNTER — Encounter (HOSPITAL_COMMUNITY): Payer: Self-pay | Admitting: Emergency Medicine

## 2015-02-03 ENCOUNTER — Emergency Department (HOSPITAL_COMMUNITY)
Admission: EM | Admit: 2015-02-03 | Discharge: 2015-02-03 | Disposition: A | Discharge status: Home / Self Care | Payer: Medicaid Other | Attending: Emergency Medicine | Admitting: Emergency Medicine

## 2015-02-03 DIAGNOSIS — B354 Tinea corporis: Secondary | ICD-10-CM | POA: Diagnosis not present

## 2015-02-03 DIAGNOSIS — R21 Rash and other nonspecific skin eruption: Secondary | ICD-10-CM | POA: Diagnosis present

## 2015-02-03 DIAGNOSIS — J45909 Unspecified asthma, uncomplicated: Secondary | ICD-10-CM | POA: Insufficient documentation

## 2015-02-03 DIAGNOSIS — Z79899 Other long term (current) drug therapy: Secondary | ICD-10-CM | POA: Insufficient documentation

## 2015-02-03 MED ORDER — TERBINAFINE HCL 250 MG PO TABS: 250.0000 mg | ORAL_TABLET | Freq: Every day | ORAL | Status: DC

## 2015-02-03 NOTE — Discharge Instructions (Signed)

## 2015-02-03 NOTE — ED Provider Notes (Signed)
CSN: [id number]     Arrival date & time 02/03/15  1937 History  This chart was scribed for non-physician practitioner Junius Finner, PA-C working with Flint Melter, MD by Murriel Hopper, ED Scribe. This patient was seen in room TR08C/TR08C and the patient's care was started at 8:17 PM.    Chief Complaint  Patient presents with  . Rash    The history is provided by the patient. No language interpreter was used.     HPI Comments: Jackie Newman is a 24 y.o. female who presents to the Emergency Department complaining of an itching rash on both arms and lower back with associated itchiness that has been present for 4 days. Pt states that the rash began on her left wrist and spread up her left arm, and eventually became prevalent on both arms. Pt denies contact with anyone else with a rash, new hygiene products or medications. Pt denies allergies to other medicines as well.    Past Medical History  Diagnosis Date  . Asthma    Past Surgical History  Procedure Laterality Date  . No past surgeries     No family history on file. History  Substance Use Topics  . Smoking status: Never Smoker   . Smokeless tobacco: Not on file  . Alcohol Use: No   OB History    Gravida Para Term Preterm AB TAB SAB Ectopic Multiple Living   1              Review of Systems  Skin: Positive for rash.      Allergies  Review of patient's allergies indicates no known allergies.  Home Medications   Prior to Admission medications   Medication Sig Start Date End Date Taking? Authorizing Provider  albuterol (PROVENTIL HFA;VENTOLIN HFA) 108 (90 BASE) MCG/ACT inhaler Inhale 2 puffs into the lungs every 6 (six) hours as needed for wheezing or shortness of breath.    Historical Provider, MD  HYDROcodone-acetaminophen (NORCO/VICODIN) 5-325 MG per tablet Take 1-2 tablets by mouth every 6 (six) hours as needed for moderate pain. 10/02/14   Heather Alger Memos, CNM  promethazine (PHENERGAN) 25 MG tablet Take  0.5-1 tablets (12.5-25 mg total) by mouth every 6 (six) hours as needed. 10/02/14   Heather Alger Memos, CNM  terbinafine (LAMISIL) 250 MG tablet Take 1 tablet (250 mg total) by mouth daily. For 2-4 weeks, may discontinue 24 hours after resolution of symptoms. 02/03/15   Junius Finner, PA-C   BP 132/66 mmHg  Pulse 91  Temp(Src) 98.4 F (36.9 C) (Oral)  Resp 16  Ht  (1.6 m)  Wt 158 lb (71.668 kg)  BMI 28.00 kg/m2  SpO2 95%  LMP 01/19/2015 Physical Exam  Constitutional: She is oriented to person, place, and time. She appears well-developed and well-nourished.  HENT:  Head: Normocephalic and atraumatic.  Eyes: EOM are normal.  Neck: Normal range of motion.  Cardiovascular: Normal rate.   Pulmonary/Chest: Effort normal. No respiratory distress.  Musculoskeletal: Normal range of motion.  Neurological: She is alert and oriented to person, place, and time.  Skin: Skin is warm and dry. Rash noted. There is erythema.  Well defined Erythematous diffuse annular rash on arms and lower back Rash is dry in appearance No induration or fluctuance  Non-tender    Psychiatric: She has a normal mood and affect. Her behavior is normal.  Nursing note and vitals reviewed.   ED Course  Procedures (including critical care time)  DIAGNOSTIC STUDIES: Oxygen Saturation is 95%  on RA, normal by my interpretation.    COORDINATION OF CARE: 8:18 PM Discussed treatment plan with pt at bedside and pt agreed to plan.   Labs Review Labs Reviewed - No data to display  Imaging Review No results found.   EKG Interpretation None      MDM   Final diagnoses:  Tinea corporis    Rash c/w tinea corporis. Rx: terbinafine. Home care instructions provided. Advised to f/u with PCP in 1-2 weeks for recheck of symptoms. Pt verbalized understanding and agreement with tx plan  I personally performed the services described in this documentation, which was scribed in my presence. The recorded information  has been reviewed and is accurate.    Junius Finnerrin O'Malley, PA-C 02/04/15 0217  Mancel BaleElliott Wentz, MD 02/04/15 1113

## 2015-02-03 NOTE — ED Notes (Signed)
Pt presents to the department with a rash on both of her arms and lower back, pt reports itchiness. Pt states she has tried benadryl but has not had relief.

## 2015-08-20 ENCOUNTER — Encounter (HOSPITAL_COMMUNITY): Payer: Self-pay | Admitting: Emergency Medicine

## 2015-08-20 ENCOUNTER — Emergency Department (HOSPITAL_COMMUNITY)
Admission: EM | Admit: 2015-08-20 | Discharge: 2015-08-21 | Disposition: A | Discharge status: Home / Self Care | Payer: Medicaid Other | Attending: Emergency Medicine | Admitting: Emergency Medicine

## 2015-08-20 DIAGNOSIS — J45909 Unspecified asthma, uncomplicated: Secondary | ICD-10-CM | POA: Insufficient documentation

## 2015-08-20 DIAGNOSIS — Z3202 Encounter for pregnancy test, result negative: Secondary | ICD-10-CM | POA: Insufficient documentation

## 2015-08-20 DIAGNOSIS — Z79899 Other long term (current) drug therapy: Secondary | ICD-10-CM | POA: Insufficient documentation

## 2015-08-20 DIAGNOSIS — K429 Umbilical hernia without obstruction or gangrene: Secondary | ICD-10-CM

## 2015-08-20 HISTORY — DX: Umbilical hernia without obstruction or gangrene: K42.9

## 2015-08-20 LAB — COMPREHENSIVE METABOLIC PANEL
ALT: 18 U/L (ref 14–54)
ANION GAP: 9 (ref 5–15)
AST: 23 U/L (ref 15–41)
Albumin: 4.1 g/dL (ref 3.5–5.0)
Alkaline Phosphatase: 55 U/L (ref 38–126)
BILIRUBIN TOTAL: 0.3 mg/dL (ref 0.3–1.2)
BUN: 7 mg/dL (ref 6–20)
CHLORIDE: 101 mmol/L (ref 101–111)
CO2: 24 mmol/L (ref 22–32)
Calcium: 8.8 mg/dL — ABNORMAL LOW (ref 8.9–10.3)
Creatinine, Ser: 1.06 mg/dL — ABNORMAL HIGH (ref 0.44–1.00)
GFR calc Af Amer: >60 mL/min (ref >60)
GFR calc non Af Amer: >60 mL/min (ref >60)
GLUCOSE: 117 mg/dL — AB (ref 65–99)
POTASSIUM: 3.4 mmol/L — AB (ref 3.5–5.1)
Sodium: 134 mmol/L — ABNORMAL LOW (ref 135–145)
TOTAL PROTEIN: 7.5 g/dL (ref 6.5–8.1)

## 2015-08-20 LAB — URINALYSIS, ROUTINE W REFLEX MICROSCOPIC
Bilirubin Urine: NEGATIVE
Glucose, UA: NEGATIVE mg/dL
HGB URINE DIPSTICK: NEGATIVE
Ketones, ur: NEGATIVE mg/dL
Nitrite: NEGATIVE
Protein, ur: NEGATIVE mg/dL
SPECIFIC GRAVITY, URINE: 1.02 (ref 1.005–1.030)
UROBILINOGEN UA: 0.2 mg/dL (ref 0.0–1.0)
pH: 6.5 (ref 5.0–8.0)

## 2015-08-20 LAB — LIPASE, BLOOD: Lipase: 24 U/L (ref 22–51)

## 2015-08-20 LAB — URINE MICROSCOPIC-ADD ON

## 2015-08-20 LAB — CBC
HEMATOCRIT: 37.3 % (ref 36.0–46.0)
HEMOGLOBIN: 12.8 g/dL (ref 12.0–15.0)
MCH: 30.9 pg (ref 26.0–34.0)
MCHC: 34.3 g/dL (ref 30.0–36.0)
MCV: 90.1 fL (ref 78.0–100.0)
Platelets: 318 10*3/uL (ref 150–400)
RBC: 4.14 MIL/uL (ref 3.87–5.11)
RDW: 12.4 % (ref 11.5–15.5)
WBC: 8.5 10*3/uL (ref 4.0–10.5)

## 2015-08-20 LAB — POC URINE PREG, ED: PREG TEST UR: NEGATIVE

## 2015-08-20 MED ORDER — HYDROCODONE-ACETAMINOPHEN 5-325 MG PO TABS: 1.0000 | ORAL_TABLET | ORAL | Status: DC | PRN

## 2015-08-20 MED ORDER — DOCUSATE SODIUM 100 MG PO CAPS: 100.0000 mg | ORAL_CAPSULE | Freq: Two times a day (BID) | ORAL | Status: DC

## 2015-08-20 MED ORDER — ONDANSETRON 4 MG PO TBDP: 4.0000 mg | ORAL_TABLET | Freq: Three times a day (TID) | ORAL | Status: DC | PRN

## 2015-08-20 NOTE — ED Notes (Signed)
Pt. reports pain at umbilical area onset last week with nausea , pain increases when bending and heavy lifting , denies diarrhea or dysuria .

## 2015-08-20 NOTE — ED Provider Notes (Signed)
This chart was scribed for [name], DO by Arlan Organ, ED Scribe. This patient was seen in room A05C/A05C and the patient's care was started 11:50 PM.   TIME SEEN: 11:50 PM   CHIEF COMPLAINT:  Chief Complaint  Patient presents with  . Abdominal Pain     HPI: HPI Comments: Jackie Newman is a 24 y.o. female with a PMHx of congenital umbilical hernia who presents to the Emergency Department complaining of intermittent, ongoing periumbilical abdominal pain that has been chronic since childhood but worsened in the last week. Pain is described as sharp. Discomfort is made worse when bending over and when picking up heavy objects. No OTC medications or home remedies attempted prior to arrival. No recent fever, chills, nausea, vomiting, dysuria, hematuria, chest pain, or shortness of breath. Denies noting any bulges. No previous consult with surgery or history of abdominal surgeries.  ROS: See HPI Constitutional: no fever  Eyes: no drainage  ENT: no runny nose   Cardiovascular:  no chest pain  Resp: no SOB  GI: no vomiting. Positive abdominal pain GU: no dysuria Integumentary: no rash  Allergy: no hives  Musculoskeletal: no leg swelling  Neurological: no slurred speech ROS otherwise negative  PAST MEDICAL HISTORY/PAST SURGICAL HISTORY:  Past Medical History  Diagnosis Date  . Asthma   . Congenital umbilical hernia     MEDICATIONS:  Prior to Admission medications   Medication Sig Start Date End Date Taking? Authorizing Provider  albuterol (PROVENTIL HFA;VENTOLIN HFA) 108 (90 BASE) MCG/ACT inhaler Inhale 2 puffs into the lungs every 6 (six) hours as needed for wheezing or shortness of breath.    Historical Provider, MD  HYDROcodone-acetaminophen (NORCO/VICODIN) 5-325 MG per tablet Take 1-2 tablets by mouth every 6 (six) hours as needed for moderate pain. 10/02/14   Armando Reichert, CNM  promethazine (PHENERGAN) 25 MG tablet Take 0.5-1 tablets (12.5-25 mg total) by mouth every  6 (six) hours as needed. 10/02/14   Armando Reichert, CNM  terbinafine (LAMISIL) 250 MG tablet Take 1 tablet (250 mg total) by mouth daily. For 2-4 weeks, may discontinue 24 hours after resolution of symptoms. 02/03/15   Junius Finner, PA-C    ALLERGIES:  No Known Allergies  SOCIAL HISTORY:  Social History  Substance Use Topics  . Smoking status: Never Smoker   . Smokeless tobacco: Not on file  . Alcohol Use: No    FAMILY HISTORY: No family history on file.  EXAM: BP 110/71 mmHg  Pulse 81  Temp(Src) 98.1 F (36.7 C) (Oral)  Resp 18  Ht  (1.6 m)  Wt 177 lb 2 oz (80.343 kg)  BMI 31.38 kg/m2  SpO2 100%  LMP 08/06/2015 CONSTITUTIONAL: Alert and oriented and responds appropriately to questions. Well-appearing; well-nourished HEAD: Normocephalic EYES: Conjunctivae clear, PERRL ENT: normal nose; no rhinorrhea; moist mucous membranes; pharynx without lesions noted NECK: Supple, no meningismus, no LAD  CARD: RRR; S1 and S2 appreciated; no murmurs, no clicks, no rubs, no gallops RESP: Normal chest excursion without splinting or tachypnea; breath sounds clear and equal bilaterally; no wheezes, no rhonchi, no rales, no hypoxia or respiratory distress, speaking full sentences ABD/GI: Normal bowel sounds; non-distended; soft, non-tender, no rebound, no guarding, no peritoneal signs; Small reducible umbilical without any overlying warmth or erythema, fluctuance or induration  BACK:  The back appears normal and is non-tender to palpation, there is no CVA tenderness EXT: Normal ROM in all joints; non-tender to palpation; no edema; normal capillary refill; no cyanosis,  no calf tenderness or swelling    SKIN: Normal color for age and race; warm NEURO: Moves all extremities equally, sensation to light touch intact diffusely, cranial nerves II through XII intact PSYCH: The patient's mood and manner are appropriate. Grooming and personal hygiene are appropriate.   MEDICAL DECISION MAKING:   Patient here with small reducible inguinal hernia with no overlying erythema or warmth. Labs, urine ordered in triage are unremarkable. No tenderness at McBurney's point, negative Murphy sign. We'll discharge with short perception for pain medication and outpatient general surgery follow-up information. I do not feel she needs any further emergent workup. Discussed return precautions. She verbalized understanding and is comfortable with this plan.     I personally performed the services described in this documentation, which was scribed in my presence. The recorded information has been reviewed and is accurate.   [name], DO 08/21/15 [phone]

## 2015-08-20 NOTE — Discharge Instructions (Signed)

## 2015-08-21 NOTE — ED Notes (Signed)
Discharge instructions/prescriptions reviewed with patient. Understanding verbalized. Patient declined wheelchair at time of discharge. No acute distress noted. 

## 2015-10-06 ENCOUNTER — Encounter (HOSPITAL_COMMUNITY): Payer: Self-pay | Admitting: *Deleted

## 2015-10-06 ENCOUNTER — Emergency Department (HOSPITAL_COMMUNITY)
Admission: EM | Admit: 2015-10-06 | Discharge: 2015-10-06 | Disposition: A | Discharge status: Home / Self Care | Payer: Medicaid Other | Attending: Emergency Medicine | Admitting: Emergency Medicine

## 2015-10-06 DIAGNOSIS — J45909 Unspecified asthma, uncomplicated: Secondary | ICD-10-CM | POA: Insufficient documentation

## 2015-10-06 DIAGNOSIS — K115 Sialolithiasis: Secondary | ICD-10-CM | POA: Insufficient documentation

## 2015-10-06 DIAGNOSIS — Z720 Tobacco use: Secondary | ICD-10-CM | POA: Insufficient documentation

## 2015-10-06 DIAGNOSIS — Z7951 Long term (current) use of inhaled steroids: Secondary | ICD-10-CM | POA: Insufficient documentation

## 2015-10-06 DIAGNOSIS — Z79899 Other long term (current) drug therapy: Secondary | ICD-10-CM | POA: Insufficient documentation

## 2015-10-06 MED ORDER — PENICILLIN V POTASSIUM 500 MG PO TABS: 500.0000 mg | ORAL_TABLET | Freq: Four times a day (QID) | ORAL | Status: AC

## 2015-10-06 MED ORDER — NAPROXEN 500 MG PO TABS: 500.0000 mg | ORAL_TABLET | Freq: Two times a day (BID) | ORAL | Status: DC

## 2015-10-06 NOTE — ED Notes (Signed)
Pt c/o abcess under chin onset today, no redness or drainage noted, pt c/o pain on palpation, abcess 2cm x 2cm, A&O x4

## 2015-10-06 NOTE — ED Provider Notes (Signed)
CSN: [id number]     Arrival date & time 10/06/15  1812 History   First MD Initiated Contact with Patient 10/06/15 1821     Chief Complaint  Patient presents with  . Skin Ulcer     (Consider location/radiation/quality/duration/timing/severity/associated sxs/prior Treatment) HPI Jackie Newman is a 24 y.o. female presents to emergency department complaining of swelling under her chin. Patient states she just noticed swelling prior to coming to emergency department. She denies any pain. She denies any dental issues. She denies any swelling under the tongue. No respiratory problems. She denies history of the same. States area under her chin is not painful, but does hurt slightly a few press hard. She has not tried any treatment prior to coming in. She denies any fever, chills, URI symptoms.  Past Medical History  Diagnosis Date  . Asthma   . Congenital umbilical hernia    Past Surgical History  Procedure Laterality Date  . No past surgeries     No family history on file. Social History  Substance Use Topics  . Smoking status: Current Every Day Smoker -- 5.00 packs/day    Types: Cigars  . Smokeless tobacco: None  . Alcohol Use: No   OB History    Gravida Para Term Preterm AB TAB SAB Ectopic Multiple Living   1              Review of Systems  Constitutional: Negative for fever and chills.  HENT: Positive for facial swelling. Negative for congestion, ear pain and sore throat.   Respiratory: Negative for cough, chest tightness and shortness of breath.   Cardiovascular: Negative for chest pain, palpitations and leg swelling.  Musculoskeletal: Negative for myalgias, arthralgias, neck pain and neck stiffness.  Skin: Negative for rash.  Neurological: Negative for headaches.  All other systems reviewed and are negative.     Allergies  Review of patient's allergies indicates no known allergies.  Home Medications   Prior to Admission medications   Medication Sig Start Date  End Date Taking? Authorizing Provider  albuterol (PROVENTIL HFA;VENTOLIN HFA) 108 (90 BASE) MCG/ACT inhaler Inhale 2 puffs into the lungs every 6 (six) hours as needed for wheezing or shortness of breath.    Historical Provider, MD  beclomethasone (QVAR) 80 MCG/ACT inhaler Inhale 1 puff into the lungs 2 (two) times daily.    Historical Provider, MD  docusate sodium (COLACE) 100 MG capsule Take 1 capsule (100 mg total) by mouth every 12 (twelve) hours. 08/20/15   Kristen N Ward, DO  HYDROcodone-acetaminophen (NORCO/VICODIN) 5-325 MG per tablet Take 1 tablet by mouth every 4 (four) hours as needed. 08/20/15   Kristen N Ward, DO  naproxen (NAPROSYN) 500 MG tablet Take 1 tablet (500 mg total) by mouth 2 (two) times daily. 10/06/15   Ladarious Kresse, PA-C  ondansetron (ZOFRAN ODT) 4 MG disintegrating tablet Take 1 tablet (4 mg total) by mouth every 8 (eight) hours as needed for nausea or vomiting. 08/20/15   Kristen N Ward, DO  penicillin v potassium (VEETID) 500 MG tablet Take 1 tablet (500 mg total) by mouth 4 (four) times daily. 10/06/15 10/13/15  Jae Skeet, PA-C   BP 122/73 mmHg  Pulse 93  Temp(Src) 98.7 F (37.1 C) (Oral)  Resp 18  Ht  (1.6 m)  Wt 160 lb (72.576 kg)  BMI 28.35 kg/m2  SpO2 100%  LMP 10/01/2015  Breastfeeding? No Physical Exam  Constitutional: She appears well-developed and well-nourished. No distress.  HENT:  2 x 2  centimeter firm mass palpated under the chin, to the left of the midline. It is not red, not warm to touch, not fluctuant. There is no skin changes. It is smooth, only mildly tender to palpation. There is no swelling under the tongue. Dentition is normal. No trismus.  Eyes: Conjunctivae are normal.  Neck: Normal range of motion. Neck supple.  Neurological: She is alert.  Skin: Skin is warm and dry.  Nursing note and vitals reviewed.   ED Course  Procedures (including critical care time) Labs Review Labs Reviewed - No data to display  Imaging  Review No results found. I have personally reviewed and evaluated these images and lab results as part of my medical decision-making.   EKG Interpretation None      MDM   Final diagnoses:  Sialolithiasis  Sialolith    patient with swelling under the chin, that is only mildly tender, with no surrounding skin changes, no erythema, no warmth to the touch. No swelling under the tongue. Exam was consistent with swelling of the sublingual gland, possibly from sialolithiasis. Will start on penicillin, naproxen for pain, warm compresses, sour candy. At this time I do not think this is an abscess or infection. Patient is in no acute distress. No fever. No respiratory problems. Doubt Ludwig's angina. Strict return precautions discussed. Instructed to return to emergency department if develops fever, worsening swelling for further testing and imaging. Also referred to ear nose throat for definitive treatment if not improving.  Filed Vitals:   10/06/15 1820  BP: 122/73  Pulse: 93  Temp: 98.7 F (37.1 C)  TempSrc: Oral  Resp: 18  Height: 5\' 3"  (1.6 m)  Weight: 160 lb (72.576 kg)  SpO2: 100%     Jaynie Crumbleatyana Genowefa Morga, PA-C 10/06/15 2236  Raeford RazorStephen Kohut, MD 10/06/15 2302

## 2015-10-06 NOTE — Discharge Instructions (Signed)
Naproxen for pain and inflammation. Try warm compresses. Penicillin as prescribed until all gone. Try sucking on sour candy. Follow-up with ENT.   Salivary Stone A salivary stone is a mineral deposit that builds up in the ducts that drain your salivary glands. Most salivary gland stones are made of calcium. When a stone forms, saliva can back up into the gland and cause painful swelling. Your salivary glands are the glands that produce spit (saliva). You have six major salivary glands. Each gland has a duct that carries saliva into your mouth. Saliva keeps your mouth moist and breaks down the food that you eat. It also helps to prevent tooth decay. Two salivary glands are located just in front of your ears (parotid). The ducts for these glands open up inside your cheeks, near your back teeth. You also have two glands under your tongue (sublingual) and two glands under your jaw (submandibular). The ducts for these glands open under your tongue. A stone can form in any salivary gland. The most common place for a salivary stone to develop is in a submandibular salivary gland. CAUSES Any condition that reduces the flow of saliva may lead to stone formation. It is not known why some people form stones and others do not.  RISK FACTORS You may be more likely to develop a salivary stone if you:  Are female.  Do not drink enough water.  Smoke.  Have high blood pressure.  Have gout.  Have diabetes. SIGNS AND SYMPTOMS The main sign of a salivary gland stone is sudden swelling of a salivary gland when eating. This usually happens under the jaw on one side. Other signs and symptoms include:  Swelling of the cheek or under the tongue when eating.  Pain in the swollen area.  Trouble chewing or swallowing.  Swelling that goes down after eating. DIAGNOSIS Your health care provider may diagnose a salivary gland stone based on your signs and symptoms. The health care provider will also do a physical  exam. In many cases, a stone can be felt in a duct inside your mouth. You may need to see an ear, nose, and throat specialist (ENT or otolaryngologist) for diagnosis and treatment. You may also need to have diagnostic tests. These may include imaging studies to check for a stone, such as:  X-rays.  Ultrasound.  CT scan.  MRI. TREATMENT Home care may be enough to treat a small stone that is not causing symptoms. Treatment of a stone that is large enough to cause symptoms may include:  Probing and widening the duct to allow the stone to pass.  Inserting a thin, flexible scope (endoscope) into the duct to locate and remove the stone.  Breaking up the stone with sound waves.  Removing the entire salivary gland. HOME CARE INSTRUCTIONS  Drink enough fluid to keep your urine clear or pale yellow.  Follow these instructions every few hours:  Suck on a lemon candy to stimulate the flow of saliva.  Put a hot compress over the gland.  Gently massage the gland.  Do not use any tobacco products, including cigarettes, chewing tobacco, or electronic cigarettes. If you need help quitting, ask your health care provider. SEEK MEDICAL CARE IF:  You have pain and swelling in your face, jaw, or mouth after eating.  You have persistent swelling in any of these places:  In front of your ear.  Under your jaw.  Inside your mouth. SEEK IMMEDIATE MEDICAL CARE IF:  You have pain and swelling  in your face, jaw, or mouth that are getting worse.  Your pain and swelling make it hard to swallow or breathe.   This information is not intended to replace advice given to you by your health care provider. Make sure you discuss any questions you have with your health care provider.   Document Released: 12/24/2004 Document Revised: 12/07/2014 Document Reviewed: 04/18/2014 Elsevier Interactive Patient Education [employer]

## 2015-10-06 NOTE — ED Notes (Signed)
Pa in seeing the pt swollen area  To the neck just below the chin

## 2016-03-15 ENCOUNTER — Emergency Department (HOSPITAL_COMMUNITY)
Admission: EM | Admit: 2016-03-15 | Discharge: 2016-03-15 | Disposition: A | Discharge status: Home / Self Care | Payer: Medicaid Other | Attending: Emergency Medicine | Admitting: Emergency Medicine

## 2016-03-15 ENCOUNTER — Encounter (HOSPITAL_COMMUNITY): Payer: Self-pay | Admitting: Emergency Medicine

## 2016-03-15 DIAGNOSIS — R197 Diarrhea, unspecified: Secondary | ICD-10-CM | POA: Insufficient documentation

## 2016-03-15 DIAGNOSIS — R000 Tachycardia, unspecified: Secondary | ICD-10-CM | POA: Insufficient documentation

## 2016-03-15 DIAGNOSIS — J45909 Unspecified asthma, uncomplicated: Secondary | ICD-10-CM | POA: Insufficient documentation

## 2016-03-15 DIAGNOSIS — N390 Urinary tract infection, site not specified: Secondary | ICD-10-CM | POA: Insufficient documentation

## 2016-03-15 DIAGNOSIS — K429 Umbilical hernia without obstruction or gangrene: Secondary | ICD-10-CM | POA: Insufficient documentation

## 2016-03-15 DIAGNOSIS — R112 Nausea with vomiting, unspecified: Secondary | ICD-10-CM | POA: Insufficient documentation

## 2016-03-15 DIAGNOSIS — Z3202 Encounter for pregnancy test, result negative: Secondary | ICD-10-CM | POA: Insufficient documentation

## 2016-03-15 DIAGNOSIS — Z791 Long term (current) use of non-steroidal anti-inflammatories (NSAID): Secondary | ICD-10-CM | POA: Insufficient documentation

## 2016-03-15 LAB — COMPREHENSIVE METABOLIC PANEL
ALBUMIN: 4.3 g/dL (ref 3.5–5.0)
ALT: 20 U/L (ref 14–54)
ANION GAP: 12 (ref 5–15)
AST: 20 U/L (ref 15–41)
Alkaline Phosphatase: 42 U/L (ref 38–126)
BUN: 6 mg/dL (ref 6–20)
CHLORIDE: 104 mmol/L (ref 101–111)
CO2: 22 mmol/L (ref 22–32)
Calcium: 9.4 mg/dL (ref 8.9–10.3)
Creatinine, Ser: 1.04 mg/dL — ABNORMAL HIGH (ref 0.44–1.00)
GFR calc Af Amer: >60 mL/min (ref >60)
GFR calc non Af Amer: >60 mL/min (ref >60)
GLUCOSE: 100 mg/dL — AB (ref 65–99)
POTASSIUM: 3.6 mmol/L (ref 3.5–5.1)
SODIUM: 138 mmol/L (ref 135–145)
Total Bilirubin: 0.6 mg/dL (ref 0.3–1.2)
Total Protein: 8.1 g/dL (ref 6.5–8.1)

## 2016-03-15 LAB — URINALYSIS, ROUTINE W REFLEX MICROSCOPIC
Glucose, UA: NEGATIVE mg/dL
HGB URINE DIPSTICK: NEGATIVE
Ketones, ur: 15 mg/dL — AB
Nitrite: NEGATIVE
PH: 5.5 (ref 5.0–8.0)
Protein, ur: 30 mg/dL — AB
SPECIFIC GRAVITY, URINE: 1.038 — AB (ref 1.005–1.030)

## 2016-03-15 LAB — CBC
HEMATOCRIT: 41.5 % (ref 36.0–46.0)
HEMOGLOBIN: 13.9 g/dL (ref 12.0–15.0)
MCH: 30.4 pg (ref 26.0–34.0)
MCHC: 33.5 g/dL (ref 30.0–36.0)
MCV: 90.8 fL (ref 78.0–100.0)
Platelets: 335 10*3/uL (ref 150–400)
RBC: 4.57 MIL/uL (ref 3.87–5.11)
RDW: 12.3 % (ref 11.5–15.5)
WBC: 7.8 10*3/uL (ref 4.0–10.5)

## 2016-03-15 LAB — URINE MICROSCOPIC-ADD ON

## 2016-03-15 LAB — LIPASE, BLOOD: LIPASE: 14 U/L (ref 11–51)

## 2016-03-15 LAB — POC URINE PREG, ED: Preg Test, Ur: NEGATIVE

## 2016-03-15 MED ORDER — ONDANSETRON HCL 4 MG/2ML IJ SOLN: 4.0000 mg | Freq: Once | INTRAMUSCULAR | Status: DC

## 2016-03-15 MED ORDER — ONDANSETRON 4 MG PO TBDP: 4.0000 mg | ORAL_TABLET | Freq: Once | ORAL | Status: DC | PRN

## 2016-03-15 MED ORDER — ONDANSETRON 4 MG PO TBDP: 4.0000 mg | ORAL_TABLET | Freq: Three times a day (TID) | ORAL | Status: DC | PRN

## 2016-03-15 MED ORDER — SODIUM CHLORIDE 0.9 % IV BOLUS (SEPSIS)
1000.0000 mL | Freq: Once | INTRAVENOUS | Status: AC
  Administered 2016-03-15: 1000 mL via INTRAVENOUS

## 2016-03-15 MED ORDER — CEPHALEXIN 500 MG PO CAPS: 500.0000 mg | ORAL_CAPSULE | Freq: Four times a day (QID) | ORAL | Status: DC

## 2016-03-15 NOTE — ED Notes (Signed)
Pt. Able to eat and drink with no problems.

## 2016-03-15 NOTE — ED Provider Notes (Signed)
CSN: [id number]     Arrival date & time 03/15/16  1217 History   First MD Initiated Contact with Patient 03/15/16 1515     Chief Complaint  Patient presents with  . Diarrhea  . Emesis     (Consider location/radiation/quality/duration/timing/severity/associated sxs/prior Treatment) Patient is a 25 y.o. female presenting with diarrhea and vomiting. The history is provided by the patient and medical records. No language interpreter was used.  Diarrhea Associated symptoms: vomiting   Associated symptoms: no abdominal pain, no chills, no fever and no headaches   Emesis Associated symptoms: diarrhea   Associated symptoms: no abdominal pain, no chills and no headaches    Jackie Newman is a 25 y.o. female  with a PMH of asthma who presents to the Emergency Department complaining of multiple episodes of non-bloody emesis which began last night. Last episode of emesis approximately 7am. Associated symptoms include nausea and several non-bloody loose stools, most recently while in the ED today. Patient states she ate a stromboli for dinner last night and symptoms started approximately 1 hour after. No other new, unusual foods. No sick contacts. No medication taken PTA for symptoms. Denies fever, abdominal pain, shortness of breath.   Patient also complaining of urinary frequency and filling like she is unable to completely void. Denies urinary frequency, dysuria, vaginal discharge, pelvic pain. Patient states she has had UTI's in the past which present with similar symptoms.   Past Medical History  Diagnosis Date  . Asthma   . Congenital umbilical hernia    Past Surgical History  Procedure Laterality Date  . No past surgeries     No family history on file. Social History  Substance Use Topics  . Smoking status: Current Every Day Smoker -- 5.00 packs/day    Types: Cigars  . Smokeless tobacco: None  . Alcohol Use: No   OB History    Gravida Para Term Preterm AB TAB SAB Ectopic Multiple  Living   1              Review of Systems  Constitutional: Negative for fever and chills.  HENT: Negative for congestion.   Eyes: Negative for visual disturbance.  Respiratory: Negative for cough and shortness of breath.   Cardiovascular: Negative.   Gastrointestinal: Positive for nausea, vomiting and diarrhea. Negative for abdominal pain and blood in stool.  Genitourinary: Positive for frequency. Negative for dysuria, vaginal bleeding, vaginal discharge and vaginal pain.  Musculoskeletal: Negative for back pain and neck pain.  Skin: Negative for rash.  Neurological: Negative for dizziness, weakness and headaches.      Allergies  Review of patient's allergies indicates no known allergies.  Home Medications   Prior to Admission medications   Medication Sig Start Date End Date Taking? Authorizing Provider  beclomethasone (QVAR) 80 MCG/ACT inhaler Inhale 1 puff into the lungs 2 (two) times daily as needed (for SOB).    Yes Historical Provider, MD  cephALEXin (KEFLEX) 500 MG capsule Take 1 capsule (500 mg total) by mouth 4 (four) times daily. 03/15/16   Chase PicketJaime Pilcher Manuel Lawhead, PA-C  docusate sodium (COLACE) 100 MG capsule Take 1 capsule (100 mg total) by mouth every 12 (twelve) hours. 08/20/15   Kristen N Erinn Mendosa, DO  HYDROcodone-acetaminophen (NORCO/VICODIN) 5-325 MG per tablet Take 1 tablet by mouth every 4 (four) hours as needed. 08/20/15   Kristen N Zarin Knupp, DO  naproxen (NAPROSYN) 500 MG tablet Take 1 tablet (500 mg total) by mouth 2 (two) times daily. 10/06/15   Jaynie Crumbleatyana Kirichenko,  PA-C  ondansetron (ZOFRAN ODT) 4 MG disintegrating tablet Take 1 tablet (4 mg total) by mouth every 8 (eight) hours as needed for nausea or vomiting. 03/15/16   Lachina Salsberry Pilcher Londa Mackowski, PA-C   BP 114/72 mmHg  Pulse 88  Temp(Src) 98.4 F (36.9 C) (Oral)  Resp 14  Ht  (1.626 m)  Wt 82.01 kg  BMI 31.02 kg/m2  SpO2 99%  LMP 03/01/2016 Physical Exam  Constitutional: She is oriented to person, place, and time. She  appears well-developed and well-nourished.  Alert, appears comfortable, and in no acute distress  HENT:  Head: Normocephalic and atraumatic.  OP clear, tacky mucus membranes.   Cardiovascular: Normal heart sounds and intact distal pulses.  Exam reveals no gallop and no friction rub.   No murmur heard. Mildly tachycardic, but regular.   Pulmonary/Chest: Effort normal and breath sounds normal. No respiratory distress. She has no wheezes. She has no rales.  Abdominal: Soft. Bowel sounds are normal. She exhibits no distension and no mass. There is no tenderness (Including no suprapubic tenderness). There is no rebound and no guarding.  Musculoskeletal: She exhibits no edema.  Neurological: She is alert and oriented to person, place, and time.  Skin: Skin is warm.  Dry skin, slightly delayed cap refill.   Nursing note and vitals reviewed.   ED Course  Procedures (including critical care time) Labs Review Labs Reviewed  COMPREHENSIVE METABOLIC PANEL - Abnormal; Notable for the following:    Glucose, Bld 100 (*)    Creatinine, Ser 1.04 (*)    All other components within normal limits  URINALYSIS, ROUTINE W REFLEX MICROSCOPIC (NOT AT [facility]) - Abnormal; Notable for the following:    Color, Urine AMBER (*)    APPearance CLOUDY (*)    Specific Gravity, Urine 1.038 (*)    Bilirubin Urine SMALL (*)    Ketones, ur 15 (*)    Protein, ur 30 (*)    Leukocytes, UA LARGE (*)    All other components within normal limits  URINE MICROSCOPIC-ADD ON - Abnormal; Notable for the following:    Squamous Epithelial / LPF 0-5 (*)    Bacteria, UA RARE (*)    All other components within normal limits  LIPASE, BLOOD  CBC  POC URINE PREG, ED    Imaging Review No results found. I have personally reviewed and evaluated these images and lab results as part of my medical decision-making.   EKG Interpretation None      MDM   Final diagnoses:  Nausea vomiting and diarrhea  UTI (lower urinary tract  infection)   Jackie Newman is a 25 y.o. female who presents to ED for two complaints:   1. Urinary frequency x 2 days: UA with large leuks, 6-30 white cells. Afebrile, no CVA tenderness on exam, no suprapubic tenderness. Patient denies any vaginal symptoms or recent unprotected intercourse. Will treat for UTI with Keflex.  2. Nausea/vomiting/diarrhea: On exam, patient with nonsurgical abdomen which is soft and nontender. She looks slightly dehydrated - pulse 104, tacky mucus membranes, slightly delayed cap refill - States that she feels nauseous at this time. Labs: CMP with cr of 1.04. CBC, lipase, and upreg unremarkable. Will give IV fluids and Zofran, then reassess.  6:02 PM - Patient reassessed and feels much improved. Tolerated ginger ale and crackers with no episodes of emesis while in ED. Repeat abdominal exam unchanged. BRAT diet encouraged. Rx for zofran given. Home care instructions including increasing hydration discussed. Return precautions discussed. PCP follow  up strongly encouraged. All questions answered.    [facility] Esaias Cleavenger, PA-C 03/15/16 1810  Leta Baptist, MD 03/16/16 [phone]

## 2016-03-15 NOTE — Discharge Instructions (Signed)
Stay very well hydrated with plenty of water throughout the day.  Please take antibiotic until completion.  Use zofran as needed for nausea/vomiting.  Eat a bland diet over the next 3 days.  Follow up with primary care physician in 1 week for recheck of ongoing symptoms.  Please seek immediate care if you develop the following: Your symptoms are no better or worse in 3 days. There is severe back pain or lower abdominal pain.  You develop chills.  You have a fever.  There is nausea or vomiting.  There is continued burning or discomfort with urination.

## 2016-03-15 NOTE — ED Notes (Signed)
C/o nausea/vomiting/diarrhea started yesterday, started after eating stromboli-- states has been up all night.,

## 2016-08-24 ENCOUNTER — Encounter (HOSPITAL_COMMUNITY): Payer: Self-pay

## 2016-08-24 ENCOUNTER — Emergency Department (HOSPITAL_COMMUNITY)
Admission: EM | Admit: 2016-08-24 | Discharge: 2016-08-24 | Disposition: A | Discharge status: Home / Self Care | Payer: Medicaid Other | Attending: Dermatology | Admitting: Dermatology

## 2016-08-24 DIAGNOSIS — Z79899 Other long term (current) drug therapy: Secondary | ICD-10-CM | POA: Insufficient documentation

## 2016-08-24 DIAGNOSIS — G43909 Migraine, unspecified, not intractable, without status migrainosus: Secondary | ICD-10-CM | POA: Insufficient documentation

## 2016-08-24 DIAGNOSIS — J45909 Unspecified asthma, uncomplicated: Secondary | ICD-10-CM | POA: Insufficient documentation

## 2016-08-24 DIAGNOSIS — F17210 Nicotine dependence, cigarettes, uncomplicated: Secondary | ICD-10-CM | POA: Insufficient documentation

## 2016-08-24 DIAGNOSIS — Z5321 Procedure and treatment not carried out due to patient leaving prior to being seen by health care provider: Secondary | ICD-10-CM | POA: Insufficient documentation

## 2016-08-24 HISTORY — DX: Unspecified convulsions: R56.9

## 2016-08-24 NOTE — ED Notes (Signed)
Pt states she wishes to leave at this time. RN advised against it and encouraged pt to stay. Pt states she still would like to go home.

## 2016-08-24 NOTE — ED Triage Notes (Signed)
Pt reports she had a seizure on 9/16. She reports she hit her head and had a knot form. Knot appears better but pt reports migraines X1 week.

## 2016-12-18 ENCOUNTER — Encounter (HOSPITAL_COMMUNITY): Payer: Self-pay | Admitting: Emergency Medicine

## 2016-12-18 ENCOUNTER — Emergency Department (HOSPITAL_COMMUNITY): Payer: Self-pay

## 2016-12-18 ENCOUNTER — Emergency Department (HOSPITAL_COMMUNITY)
Admission: EM | Admit: 2016-12-18 | Discharge: 2016-12-18 | Disposition: A | Discharge status: Home / Self Care | Payer: Self-pay | Attending: Emergency Medicine | Admitting: Emergency Medicine

## 2016-12-18 DIAGNOSIS — J111 Influenza due to unidentified influenza virus with other respiratory manifestations: Secondary | ICD-10-CM | POA: Insufficient documentation

## 2016-12-18 DIAGNOSIS — J45909 Unspecified asthma, uncomplicated: Secondary | ICD-10-CM | POA: Insufficient documentation

## 2016-12-18 DIAGNOSIS — Z87891 Personal history of nicotine dependence: Secondary | ICD-10-CM | POA: Insufficient documentation

## 2016-12-18 DIAGNOSIS — R69 Illness, unspecified: Secondary | ICD-10-CM

## 2016-12-18 LAB — BASIC METABOLIC PANEL
ANION GAP: 16 — AB (ref 5–15)
BUN: <5 mg/dL — ABNORMAL LOW (ref 6–20)
CHLORIDE: 102 mmol/L (ref 101–111)
CO2: 19 mmol/L — AB (ref 22–32)
Calcium: 10.4 mg/dL — ABNORMAL HIGH (ref 8.9–10.3)
Creatinine, Ser: 0.96 mg/dL (ref 0.44–1.00)
GFR calc non Af Amer: >60 mL/min (ref >60)
GLUCOSE: 111 mg/dL — AB (ref 65–99)
POTASSIUM: 3.3 mmol/L — AB (ref 3.5–5.1)
Sodium: 137 mmol/L (ref 135–145)

## 2016-12-18 LAB — URINALYSIS, ROUTINE W REFLEX MICROSCOPIC
BILIRUBIN URINE: NEGATIVE
Glucose, UA: NEGATIVE mg/dL
Hgb urine dipstick: NEGATIVE
Ketones, ur: NEGATIVE mg/dL
LEUKOCYTES UA: NEGATIVE
NITRITE: NEGATIVE
PH: 6 (ref 5.0–8.0)
PROTEIN: NEGATIVE mg/dL
Specific Gravity, Urine: 1.006 (ref 1.005–1.030)

## 2016-12-18 LAB — CBC WITH DIFFERENTIAL/PLATELET
BASOS ABS: 0 10*3/uL (ref 0.0–0.1)
Basophils Relative: 0 %
Eosinophils Absolute: 0.1 10*3/uL (ref 0.0–0.7)
Eosinophils Relative: 1 %
HEMATOCRIT: 42.5 % (ref 36.0–46.0)
HEMOGLOBIN: 14.9 g/dL (ref 12.0–15.0)
LYMPHS PCT: 19 %
Lymphs Abs: 1.8 10*3/uL (ref 0.7–4.0)
MCH: 31.3 pg (ref 26.0–34.0)
MCHC: 35.1 g/dL (ref 30.0–36.0)
MCV: 89.3 fL (ref 78.0–100.0)
Monocytes Absolute: 1.1 10*3/uL — ABNORMAL HIGH (ref 0.1–1.0)
Monocytes Relative: 12 %
NEUTROS ABS: 6.4 10*3/uL (ref 1.7–7.7)
NEUTROS PCT: 68 %
Platelets: 359 10*3/uL (ref 150–400)
RBC: 4.76 MIL/uL (ref 3.87–5.11)
RDW: 12.4 % (ref 11.5–15.5)
WBC: 9.3 10*3/uL (ref 4.0–10.5)

## 2016-12-18 LAB — I-STAT BETA HCG BLOOD, ED (MC, WL, AP ONLY): I-stat hCG, quantitative: <5 m[IU]/mL (ref <5)

## 2016-12-18 LAB — I-STAT CG4 LACTIC ACID, ED: Lactic Acid, Venous: 4.54 mmol/L (ref 0.5–1.9)

## 2016-12-18 LAB — LACTIC ACID, PLASMA: LACTIC ACID, VENOUS: 2 mmol/L — AB (ref 0.5–1.9)

## 2016-12-18 LAB — INFLUENZA PANEL BY PCR (TYPE A & B)
INFLAPCR: POSITIVE — AB
INFLBPCR: NEGATIVE

## 2016-12-18 MED ORDER — IBUPROFEN 400 MG PO TABS
600.0000 mg | ORAL_TABLET | Freq: Once | ORAL | Status: AC
  Administered 2016-12-18: 20:00:00 600 mg via ORAL
  Filled 2016-12-18: qty 1

## 2016-12-18 MED ORDER — GUAIFENESIN-CODEINE 100-10 MG/5ML PO SOLN
5.0000 mL | Freq: Once | ORAL | Status: AC
  Administered 2016-12-18: 5 mL via ORAL
  Filled 2016-12-18: qty 5

## 2016-12-18 MED ORDER — ACETAMINOPHEN 500 MG PO TABS: 1000.0000 mg | ORAL_TABLET | Freq: Once | ORAL | Status: DC

## 2016-12-18 MED ORDER — SODIUM CHLORIDE 0.9 % IV BOLUS (SEPSIS)
1000.0000 mL | Freq: Once | INTRAVENOUS | Status: AC
  Administered 2016-12-18: 1000 mL via INTRAVENOUS

## 2016-12-18 MED ORDER — ONDANSETRON HCL 4 MG/2ML IJ SOLN
4.0000 mg | Freq: Once | INTRAMUSCULAR | Status: AC
  Administered 2016-12-18: 4 mg via INTRAVENOUS
  Filled 2016-12-18: qty 2

## 2016-12-18 MED ORDER — ACETAMINOPHEN 325 MG PO TABS
650.0000 mg | ORAL_TABLET | Freq: Once | ORAL | Status: AC | PRN
  Administered 2016-12-18: 650 mg via ORAL

## 2016-12-18 MED ORDER — ACETAMINOPHEN 325 MG PO TABS
ORAL_TABLET | ORAL | Status: AC
  Filled 2016-12-18: qty 2

## 2016-12-18 NOTE — ED Notes (Signed)
Elevated CG-4 reported to Dr. Zavitz 

## 2016-12-18 NOTE — ED Provider Notes (Signed)
[facility]EMERGENCY DEPT Provider Note   CSN: [id number] Arrival date & time: 12/18/16  1737     History   Chief Complaint Chief Complaint  Patient presents with  . URI  . Cough    HPI Jackie Newman is a 26 y.o. female.  Patient presents with worsening respiratory and flulike symptoms for almost 2 weeks now. Family members are similar. Patient had fatigue decreased appetite cough congestion bodyaches. No recent travel. No IV drug abuse or infectious disease history. Patient has asthma that is controlled.      Past Medical History:  Diagnosis Date  . Asthma   . Congenital umbilical hernia   . Seizures (HCC)     There are no active problems to display for this patient.   Past Surgical History:  Procedure Laterality Date  . NO PAST SURGERIES      OB History    Gravida Para Term Preterm AB Living   1             SAB TAB Ectopic Multiple Live Births                   Home Medications    Prior to Admission medications   Medication Sig Start Date End Date Taking? Authorizing Provider  beclomethasone (QVAR) 80 MCG/ACT inhaler Inhale 1 puff into the lungs 2 (two) times daily as needed (for SOB).     Historical Provider, MD  cephALEXin (KEFLEX) 500 MG capsule Take 1 capsule (500 mg total) by mouth 4 (four) times daily. 03/15/16   Chase Picket Ward, PA-C  docusate sodium (COLACE) 100 MG capsule Take 1 capsule (100 mg total) by mouth every 12 (twelve) hours. 08/20/15   Kristen N Ward, DO  HYDROcodone-acetaminophen (NORCO/VICODIN) 5-325 MG per tablet Take 1 tablet by mouth every 4 (four) hours as needed. 08/20/15   Kristen N Ward, DO  naproxen (NAPROSYN) 500 MG tablet Take 1 tablet (500 mg total) by mouth 2 (two) times daily. 10/06/15   Tatyana Kirichenko, PA-C  ondansetron (ZOFRAN ODT) 4 MG disintegrating tablet Take 1 tablet (4 mg total) by mouth every 8 (eight) hours as needed for nausea or vomiting. 03/15/16   Chase Picket Ward, PA-C    Family History History reviewed.  No pertinent family history.  Social History Social History  Substance Use Topics  . Smoking status: Former Smoker    Packs/day: 5.00    Types: Cigars  . Smokeless tobacco: Never Used  . Alcohol use No     Comment: occasional socially     Allergies   Patient has no known allergies.   Review of Systems Review of Systems  Constitutional: Positive for appetite change, chills and fever.  HENT: Negative for congestion.   Eyes: Negative for visual disturbance.  Respiratory: Positive for cough and shortness of breath.   Cardiovascular: Negative for chest pain.  Gastrointestinal: Positive for nausea and vomiting. Negative for abdominal pain.  Genitourinary: Negative for dysuria and flank pain.  Musculoskeletal: Negative for back pain, neck pain and neck stiffness.  Skin: Negative for rash.  Neurological: Positive for light-headedness. Negative for headaches.     Physical Exam Updated Vital Signs BP 110/58 (BP Location: Left Arm)   Pulse (!) 151   Temp 101 F (38.3 C) (Oral)   Resp 22   LMP 12/16/2016   SpO2 100%   Physical Exam  Constitutional: She appears well-developed and well-nourished. No distress.  HENT:  Head: Normocephalic and atraumatic.  Dry mucous membranes  Eyes: Conjunctivae  are normal.  Neck: Neck supple.  Cardiovascular: Regular rhythm.  Tachycardia present.   No murmur heard. Pulmonary/Chest: Effort normal and breath sounds normal. No respiratory distress.  Abdominal: Soft. There is no tenderness.  Musculoskeletal: She exhibits no edema.  Neurological: She is alert.  Skin: Skin is warm and dry. No rash noted.  Psychiatric: She has a normal mood and affect.  Nursing note and vitals reviewed.    ED Treatments / Results  Labs (all labs ordered are listed, but only abnormal results are displayed) Labs Reviewed  CULTURE, BLOOD (ROUTINE X 2)  CULTURE, BLOOD (ROUTINE X 2)  CBC WITH DIFFERENTIAL/PLATELET  BASIC METABOLIC PANEL  INFLUENZA PANEL BY  PCR (TYPE A & B)  URINALYSIS, ROUTINE W REFLEX MICROSCOPIC  I-STAT CG4 LACTIC ACID, ED  I-STAT BETA HCG BLOOD, ED ([facility], [facility], [facility] ONLY)    EKG  EKG Interpretation  Date/Time:  Friday December 18 2016 18:07:45 EST Ventricular Rate:  150 PR Interval:    QRS Duration: 76 QT Interval:  338 QTC Calculation: 534 R Axis:   92 Text Interpretation:  Sinus tachycardia Nonspecific ST abnormality Abnormal ECG Confirmed by Dimond Crotty MD, Ivin BootyJOSHUA ([id number]) on 12/18/2016 6:19:33 PM       Radiology No results found.  Procedures Procedures (including critical care time) CRITICAL CARE Performed by: Enid SkeensZAVITZ, Lenah Messenger M   Total critical care time: 35 minutes  Critical care time was exclusive of separately billable procedures and treating other patients.  Critical care was necessary to treat or prevent imminent or life-threatening deterioration.  Critical care was time spent personally by me on the following activities: development of treatment plan with patient and/or surrogate as well as nursing, discussions with consultants, evaluation of patient's response to treatment, examination of patient, obtaining history from patient or surrogate, ordering and performing treatments and interventions, ordering and review of laboratory studies, ordering and review of radiographic studies, pulse oximetry and re-evaluation of patient's condition.  Medications Ordered in ED Medications  acetaminophen (TYLENOL) 325 MG tablet (not administered)  sodium chloride 0.9 % bolus 1,000 mL (not administered)  sodium chloride 0.9 % bolus 1,000 mL (not administered)  acetaminophen (TYLENOL) tablet 650 mg (650 mg Oral Given 12/18/16 1801)  sodium chloride 0.9 % bolus 1,000 mL (1,000 mLs Intravenous New Bag/Given 12/18/16 1840)     Initial Impression / Assessment and Plan / ED Course  I have reviewed the triage vital signs and the nursing notes.  Pertinent labs & imaging results that were available during my care of the patient  were reviewed by me and considered in my medical decision making (see chart for details).    Patient presents with flulike illness from last 2 weeks. Patient clinically dehydrated, tachycardia. Sepsis protocol, IV fluid boluses, blood cultures, flu test. Chest x-ray pending.  Patient improved significantly after 3 L of fluid. Chest x-ray unremarkable. Vitals improved. Patient stable for outpatient follow-up.  Results and differential diagnosis were discussed with the patient/parent/guardian. Xrays were independently reviewed by myself.  Close follow up outpatient was discussed, comfortable with the plan.   Medications  acetaminophen (TYLENOL) tablet 650 mg (650 mg Oral Given 12/18/16 1801)  sodium chloride 0.9 % bolus 1,000 mL (0 mLs Intravenous Stopped 12/18/16 1938)  sodium chloride 0.9 % bolus 1,000 mL (1,000 mLs Intravenous New Bag/Given 12/18/16 1830)  sodium chloride 0.9 % bolus 1,000 mL (1,000 mLs Intravenous New Bag/Given 12/18/16 2155)  ondansetron (ZOFRAN) injection 4 mg (4 mg Intravenous Given 12/18/16 1955)  guaiFENesin-codeine 100-10 MG/5ML solution 5  mL (5 mLs Oral Given 12/18/16 1955)  ibuprofen (ADVIL,MOTRIN) tablet 600 mg (600 mg Oral Given 12/18/16 1955)    Vitals:   12/18/16 2001 12/18/16 2019 12/18/16 2030 12/18/16 2111  BP: 117/63 102/62 104/67 107/61  Pulse: 105 103 (!) 123 104  Resp: 16 22 18 19   Temp:      TempSrc:      SpO2: 100% 98% 96% 98%    Final diagnoses:  Influenza-like illness    Final Clinical Impressions(s) / ED Diagnoses   Final diagnoses:  Influenza-like illness    New Prescriptions New Prescriptions   No medications on file     Blane Ohara, MD 12/18/16 2312

## 2016-12-18 NOTE — Discharge Instructions (Signed)
If you were given medicines take as directed.  If you are on coumadin or contraceptives realize their levels and effectiveness is altered by many different medicines.  If you have any reaction (rash, tongues swelling, other) to the medicines stop taking and see a physician.    If your blood pressure was elevated in the ER make sure you follow up for management with a primary doctor or return for chest pain, shortness of breath or stroke symptoms.  Please follow up as directed and return to the ER or see a physician for new or worsening symptoms.  Thank you. Vitals:   12/18/16 2001 12/18/16 2019 12/18/16 2030 12/18/16 2111  BP: 117/63 102/62 104/67 107/61  Pulse: 105 103 (!) 123 104  Resp: 16 22 18 19   Temp:      TempSrc:      SpO2: 100% 98% 96% 98%

## 2016-12-18 NOTE — ED Triage Notes (Addendum)
Pt arrives via POv from home with cough, URI sx for the last 2 weeks. Pt reports symptoms have been worsening her asthma. Pt with dry cough. Lungs CTA. Pt reports sick contacts at home.

## 2016-12-23 LAB — CULTURE, BLOOD (ROUTINE X 2)
Culture: NO GROWTH
Culture: NO GROWTH

## 2017-04-16 IMAGING — DX DG CHEST 2V
2 series · 2 of 2 positions shown · non-contrast
Comparison: None.

CLINICAL DATA: Cough, upper respiratory tract infection for 2
weeks. History of asthma.

EXAM:
CHEST  2 VIEW

[w chest pa]
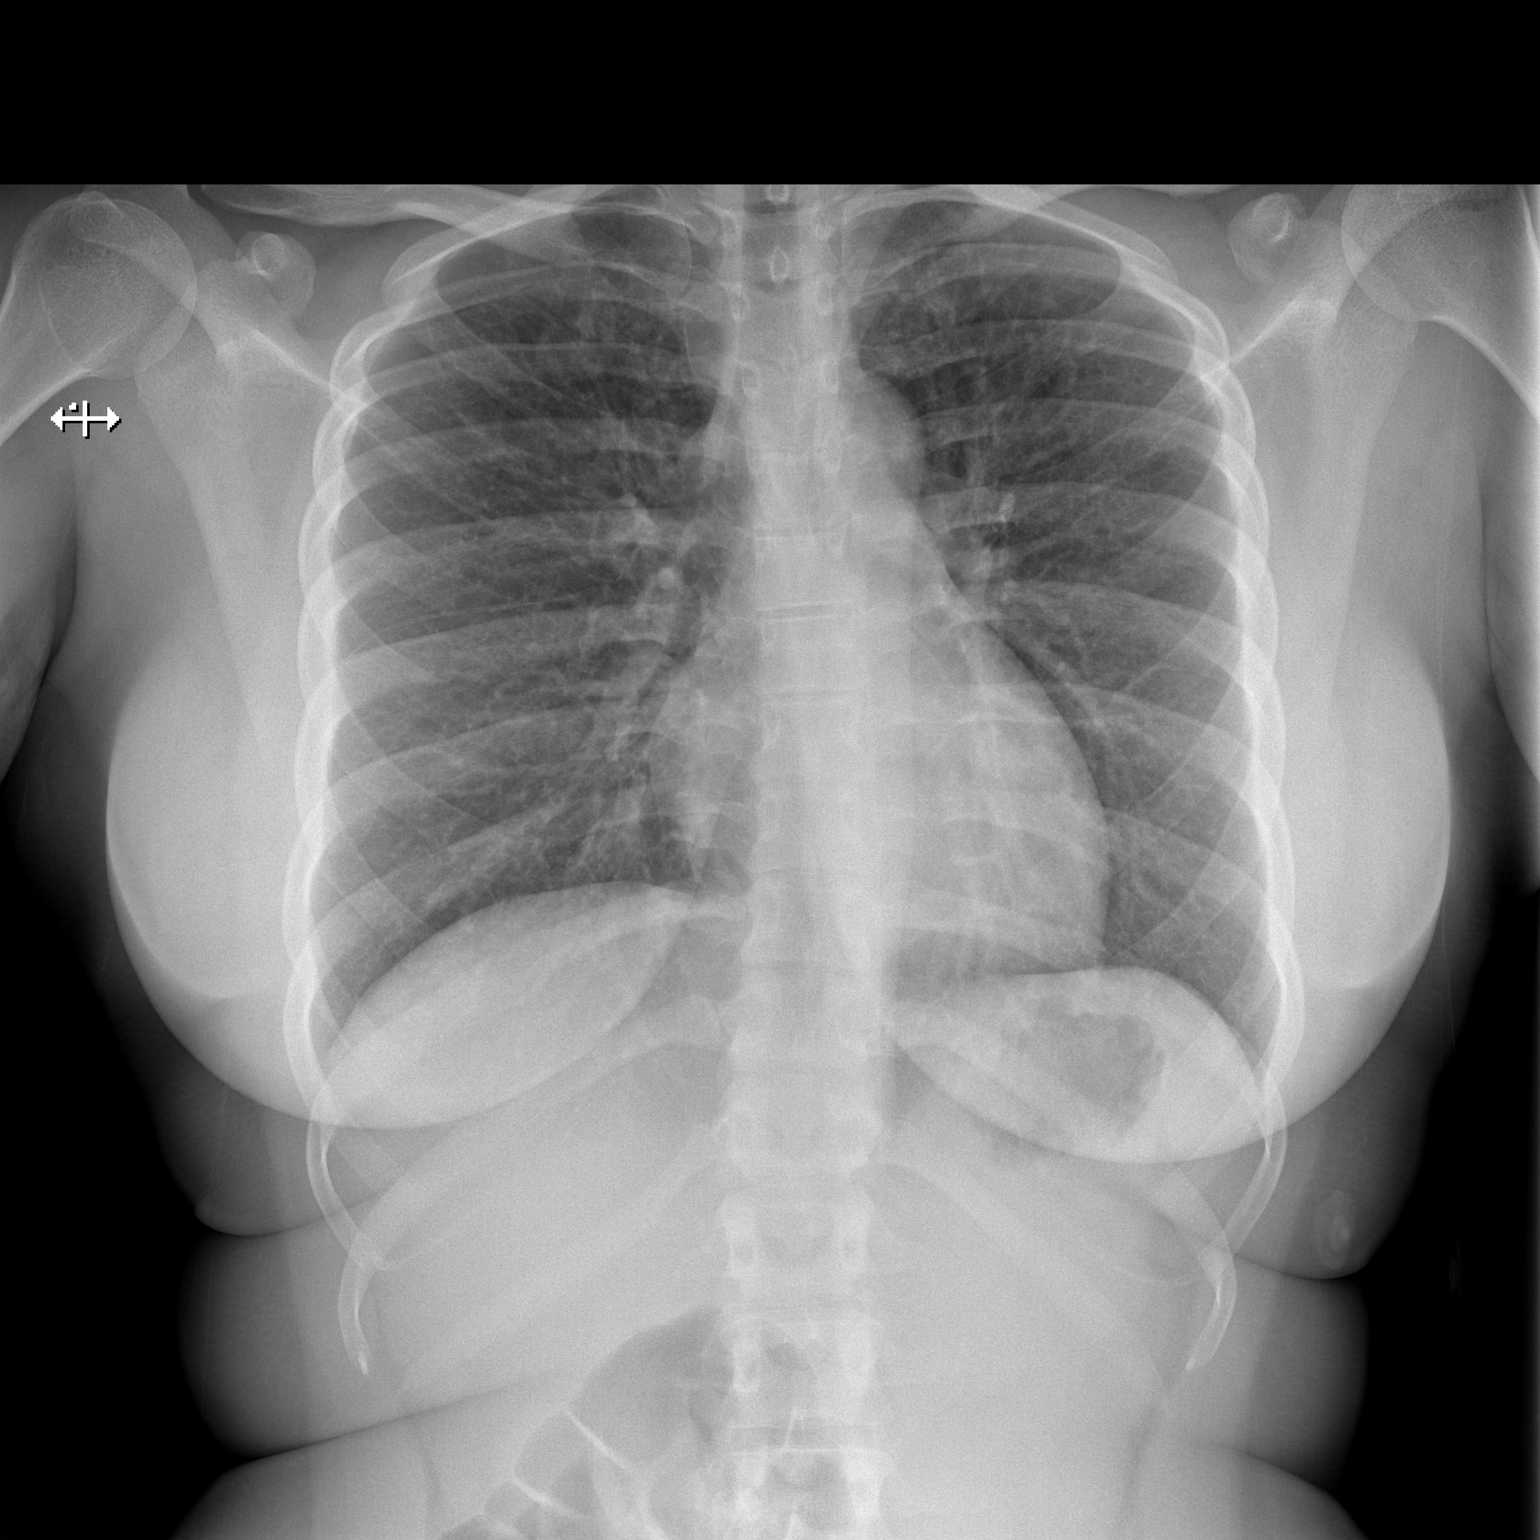

[w chest lat]
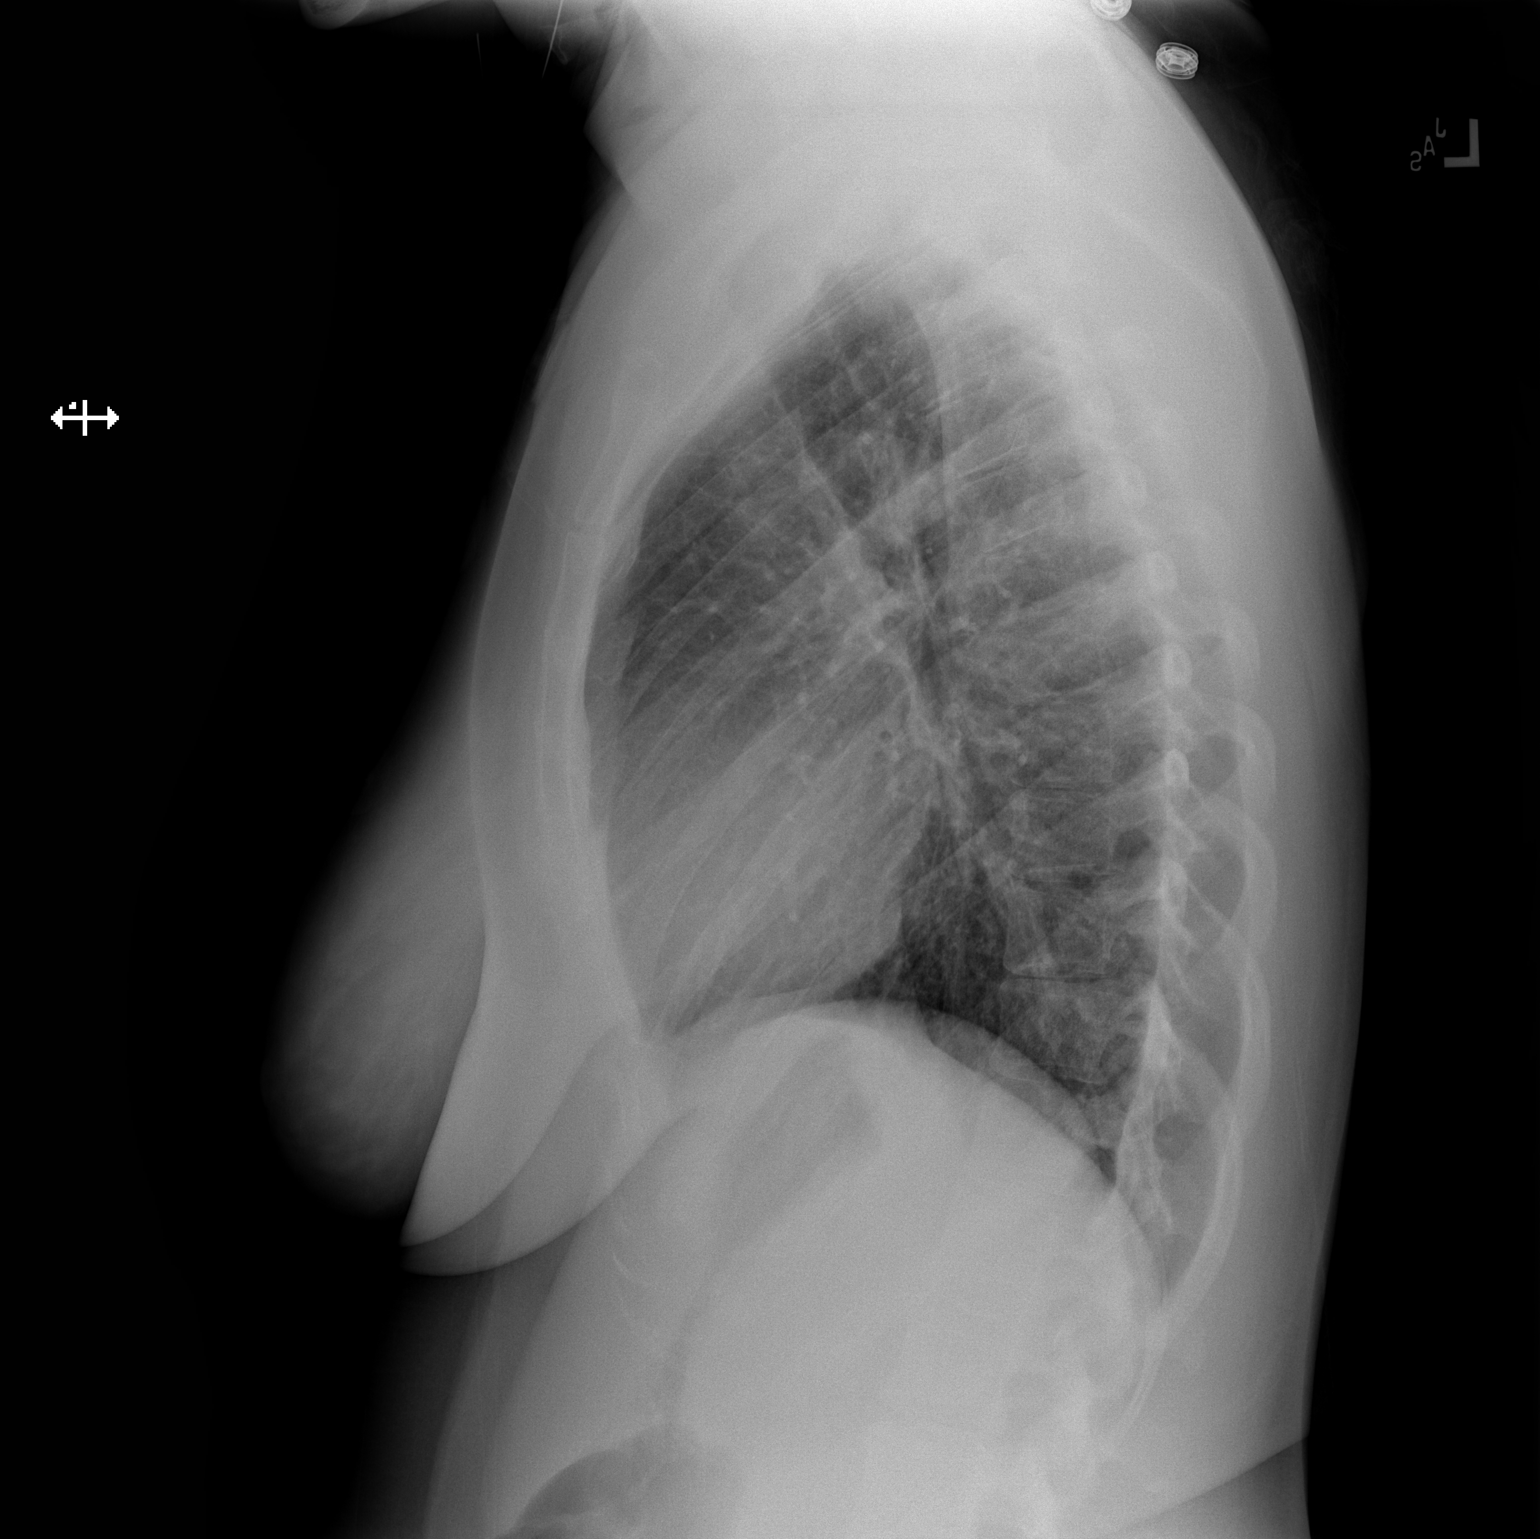

[2 of 2 positions shown; findings below may reference images not displayed]

FINDINGS: Cardiomediastinal silhouette is normal. No pleural effusions or
focal consolidations. Mild bronchitic changes. Trachea projects
midline and there is no pneumothorax. Soft tissue planes and
included osseous structures are non-suspicious.
IMPRESSION: Mild bronchitic changes without focal consolidation.

## 2017-08-04 ENCOUNTER — Encounter (HOSPITAL_COMMUNITY): Payer: Self-pay | Admitting: *Deleted

## 2017-08-04 ENCOUNTER — Emergency Department (HOSPITAL_COMMUNITY)
Admission: EM | Admit: 2017-08-04 | Discharge: 2017-08-04 | Disposition: A | Discharge status: Home / Self Care | Payer: Self-pay | Attending: Emergency Medicine | Admitting: Emergency Medicine

## 2017-08-04 DIAGNOSIS — R05 Cough: Secondary | ICD-10-CM | POA: Insufficient documentation

## 2017-08-04 DIAGNOSIS — J4520 Mild intermittent asthma, uncomplicated: Secondary | ICD-10-CM | POA: Insufficient documentation

## 2017-08-04 DIAGNOSIS — Z87891 Personal history of nicotine dependence: Secondary | ICD-10-CM | POA: Insufficient documentation

## 2017-08-04 DIAGNOSIS — Z79899 Other long term (current) drug therapy: Secondary | ICD-10-CM | POA: Insufficient documentation

## 2017-08-04 MED ORDER — ALBUTEROL SULFATE (2.5 MG/3ML) 0.083% IN NEBU
INHALATION_SOLUTION | RESPIRATORY_TRACT | Status: AC
  Administered 2017-08-04: 2.5 mg
  Filled 2017-08-04: qty 3

## 2017-08-04 MED ORDER — ALBUTEROL SULFATE HFA 108 (90 BASE) MCG/ACT IN AERS
1.0000 | INHALATION_SPRAY | Freq: Once | RESPIRATORY_TRACT | Status: AC
  Administered 2017-08-04: 2 via RESPIRATORY_TRACT
  Filled 2017-08-04: qty 6.7

## 2017-08-04 NOTE — ED Triage Notes (Signed)
Pt reports hx of asthma, was around a lot of dust and then onset of cough. Reports inhaler is expired. spo2 100% at triage.

## 2017-08-04 NOTE — ED Provider Notes (Signed)
[facility] Provider Note   CSN: [id number] Arrival date & time: 08/04/17  0941     History   Chief Complaint Chief Complaint  Patient presents with  . Cough  . Asthma    HPI Jackie Newman is a 26 y.o. female who presents with a cough and shortness of breath. Past medical history significant for asthma. She states that this morning when she was at work she was exposed to dust and this triggered shortness of breath and coughing. She denies any wheezing. She has 2 inhalers both of which are expired. She does not have a primary care doctor she does not have insurance. She denies any fever but notes she started to have chills this morning. She denies any URI symptoms.  HPI  Past Medical History:  Diagnosis Date  . Asthma   . Congenital umbilical hernia   . Seizures (HCC)     There are no active problems to display for this patient.   Past Surgical History:  Procedure Laterality Date  . NO PAST SURGERIES      OB History    Gravida Para Term Preterm AB Living   1             SAB TAB Ectopic Multiple Live Births                   Home Medications    Prior to Admission medications   Medication Sig Start Date End Date Taking? Authorizing Provider  beclomethasone (QVAR) 80 MCG/ACT inhaler Inhale 1 puff into the lungs 2 (two) times daily as needed (for SOB).     [provider]  cephALEXin (KEFLEX) 500 MG capsule Take 1 capsule (500 mg total) by mouth 4 (four) times daily. 03/15/16   Ward, Chase PicketJaime Pilcher, PA-C  docusate sodium (COLACE) 100 MG capsule Take 1 capsule (100 mg total) by mouth every 12 (twelve) hours. 08/20/15   Ward, Layla MawKristen N, DO  HYDROcodone-acetaminophen (NORCO/VICODIN) 5-325 MG per tablet Take 1 tablet by mouth every 4 (four) hours as needed. 08/20/15   Ward, Layla MawKristen N, DO  naproxen (NAPROSYN) 500 MG tablet Take 1 tablet (500 mg total) by mouth 2 (two) times daily. 10/06/15   Kirichenko, Tatyana, PA-C  ondansetron (ZOFRAN ODT) 4 MG  disintegrating tablet Take 1 tablet (4 mg total) by mouth every 8 (eight) hours as needed for nausea or vomiting. 03/15/16   Ward, Chase PicketJaime Pilcher, PA-C    Family History History reviewed. No pertinent family history.  Social History Social History  Substance Use Topics  . Smoking status: Former Smoker    Packs/day: 5.00    Types: Cigars  . Smokeless tobacco: Never Used  . Alcohol use No     Comment: occasional socially     Allergies   Patient has no known allergies.   Review of Systems Review of Systems  Constitutional: Positive for chills. Negative for fever.  HENT: Negative for congestion, ear pain, rhinorrhea and sore throat.   Respiratory: Positive for cough and shortness of breath. Negative for wheezing.   All other systems reviewed and are negative.    Physical Exam Updated Vital Signs BP 107/62 (BP Location: Left Arm)   Pulse 80   Temp 98 F (36.7 C) (Oral)   Resp 18   LMP 07/19/2017   SpO2 100%   Physical Exam  Constitutional: She is oriented to person, place, and time. She appears well-developed and well-nourished. No distress.  HENT:  Head: Normocephalic and atraumatic.  Right Ear:  Hearing, tympanic membrane, external ear and ear canal normal.  Left Ear: Hearing, tympanic membrane, external ear and ear canal normal.  Nose: Mucosal edema present.  Mouth/Throat: Uvula is midline, oropharynx is clear and moist and mucous membranes are normal.  Eyes: Pupils are equal, round, and reactive to light. Conjunctivae are normal. Right eye exhibits no discharge. Left eye exhibits no discharge. No scleral icterus.  Neck: Normal range of motion.  Cardiovascular: Normal rate and regular rhythm.  Exam reveals no gallop and no friction rub.   No murmur heard. Pulmonary/Chest: Effort normal and breath sounds normal. No respiratory distress. She has no wheezes. She has no rales. She exhibits no tenderness.  Abdominal: She exhibits no distension.  Neurological: She is alert  and oriented to person, place, and time.  Skin: Skin is warm and dry.  Psychiatric: She has a normal mood and affect. Her behavior is normal.  Nursing note and vitals reviewed.    ED Treatments / Results  Labs (all labs ordered are listed, but only abnormal results are displayed) Labs Reviewed - No data to display  EKG  EKG Interpretation None       Radiology No results found.  Procedures Procedures (including critical care time)  Medications Ordered in ED Medications  albuterol (PROVENTIL HFA;VENTOLIN HFA) 108 (90 Base) MCG/ACT inhaler 1-2 puff (not administered)  albuterol (PROVENTIL) (2.5 MG/3ML) 0.083% nebulizer solution (2.5 mg  Given 08/04/17 1022)     Initial Impression / Assessment and Plan / ED Course  I have reviewed the triage vital signs and the nursing notes.  Pertinent labs & imaging results that were available during my care of the patient were reviewed by me and considered in my medical decision making (see chart for details).  26 year old with mild asthma exacerbation. Albuterol breathing treatment was given prior to my examination. She feels much better after the treatment. Symptoms have mostly resolved and she is still having chills. I advised her to establish care with [facility] and wellness if she can obtain assistance with her medications. Albuterol inhaler was given here in the ED. Return precautions given.  Final Clinical Impressions(s) / ED Diagnoses   Final diagnoses:  Mild intermittent asthma, unspecified whether complicated    New Prescriptions New Prescriptions   No medications on file     Beryle Quant 08/04/17 1120    Donnetta Hutching, MD 08/07/17 [phone]

## 2017-08-04 NOTE — Discharge Instructions (Signed)
[facility] follow [facility] to establish care and to get assistance with your medications

## 2017-08-04 NOTE — ED Notes (Signed)
Pt coughing repeatedly, states she cannot breath. Gagging from coughing spells.

## 2017-11-13 ENCOUNTER — Emergency Department (HOSPITAL_COMMUNITY)
Admission: EM | Admit: 2017-11-13 | Discharge: 2017-11-13 | Disposition: A | Discharge status: Home / Self Care | Payer: Self-pay | Attending: Emergency Medicine | Admitting: Emergency Medicine

## 2017-11-13 ENCOUNTER — Other Ambulatory Visit: Payer: Self-pay

## 2017-11-13 ENCOUNTER — Encounter (HOSPITAL_COMMUNITY): Payer: Self-pay

## 2017-11-13 DIAGNOSIS — J45909 Unspecified asthma, uncomplicated: Secondary | ICD-10-CM | POA: Insufficient documentation

## 2017-11-13 DIAGNOSIS — Z87891 Personal history of nicotine dependence: Secondary | ICD-10-CM | POA: Insufficient documentation

## 2017-11-13 DIAGNOSIS — N63 Unspecified lump in unspecified breast: Secondary | ICD-10-CM | POA: Insufficient documentation

## 2017-11-13 DIAGNOSIS — Z79899 Other long term (current) drug therapy: Secondary | ICD-10-CM | POA: Insufficient documentation

## 2017-11-13 NOTE — ED Provider Notes (Signed)
[facility] EMERGENCY DEPARTMENT Provider Note   CSN: [id number] Arrival date & time: 11/13/17  1123     History   Chief Complaint No chief complaint on file.   HPI Jackie Newman is a 26 y.o. female.  HPI  Patient is a 26 year old female with a history of asthma presenting for a lump she noted in her left breast with associated "burning".  Patient reports she noticed this 24 hours ago.  Patient reports it does not hurt at rest, however it will hurt with compression or lying on the lump in her left breast.  Patient denies any erythema of the breast, fever, chills, or nipple drainage.  Patient denies any noted axillary lymphadenopathy.  Patient reports that last menstrual period was 2 weeks ago.  Patient attempted placing ice on the breast for relief.  No family history in first or second-degree female relatives of breast cancer.  Past Medical History:  Diagnosis Date  . Asthma   . Congenital umbilical hernia   . Seizures (HCC)     There are no active problems to display for this patient.   Past Surgical History:  Procedure Laterality Date  . NO PAST SURGERIES      OB History    Gravida Para Term Preterm AB Living   1             SAB TAB Ectopic Multiple Live Births                   Home Medications    Prior to Admission medications   Medication Sig Start Date End Date Taking? Authorizing Provider  beclomethasone (QVAR) 80 MCG/ACT inhaler Inhale 1 puff into the lungs 2 (two) times daily as needed (for SOB).     [provider]  cephALEXin (KEFLEX) 500 MG capsule Take 1 capsule (500 mg total) by mouth 4 (four) times daily. 03/15/16   Ward, Chase PicketJaime Pilcher, PA-C  docusate sodium (COLACE) 100 MG capsule Take 1 capsule (100 mg total) by mouth every 12 (twelve) hours. 08/20/15   Ward, Layla MawKristen N, DO  HYDROcodone-acetaminophen (NORCO/VICODIN) 5-325 MG per tablet Take 1 tablet by mouth every 4 (four) hours as needed. 08/20/15   Ward, Layla MawKristen N, DO    naproxen (NAPROSYN) 500 MG tablet Take 1 tablet (500 mg total) by mouth 2 (two) times daily. 10/06/15   Kirichenko, Tatyana, PA-C  ondansetron (ZOFRAN ODT) 4 MG disintegrating tablet Take 1 tablet (4 mg total) by mouth every 8 (eight) hours as needed for nausea or vomiting. 03/15/16   Ward, Chase PicketJaime Pilcher, PA-C    Family History No family history on file.  Social History Social History   Tobacco Use  . Smoking status: Former Smoker    Packs/day: 5.00    Types: Cigars  . Smokeless tobacco: Never Used  Substance Use Topics  . Alcohol use: No    Comment: occasional socially  . Drug use: No     Allergies   Patient has no known allergies.   Review of Systems Review of Systems  Constitutional: Negative for chills and fever.  Genitourinary: Negative for menstrual problem.  Skin:       + breast lump  Hematological: Negative for adenopathy.     Physical Exam Updated Vital Signs BP 119/69 (BP Location: Right Arm)   Pulse 84   Temp 98.6 F (37 C) (Oral)   Resp 16   SpO2 100%   Physical Exam  Constitutional: She appears well-developed and well-nourished. No distress.  Sitting comfortably in bed.  HENT:  Head: Normocephalic and atraumatic.  Eyes: Conjunctivae are normal. Right eye exhibits no discharge. Left eye exhibits no discharge.  EOMs normal to gross examination.  Neck: Normal range of motion.  Cardiovascular: Normal rate and regular rhythm.  Intact, 2+ radial pulse.  Pulmonary/Chest:  Normal respiratory effort. Patient converses comfortably. No audible wheeze or stridor.  Abdominal: She exhibits no distension.  Musculoskeletal: Normal range of motion.  Lymphadenopathy:    She has no axillary adenopathy.  Neurological: She is alert.  Cranial nerves intact to gross observation. Patient moves extremities without difficulty.  Skin: Skin is warm and dry. She is not diaphoretic.  Breast exam performed with nurse chaperone present.  The left breast exhibits a  discrete, firm and mobile mass in the lower outer quadrant of the left breast.  This is tender to palpation.  There is no overlying erythema or fluctuance.  Psychiatric: She has a normal mood and affect. Her behavior is normal. Judgment and thought content normal.  Nursing note and vitals reviewed.    ED Treatments / Results  Labs (all labs ordered are listed, but only abnormal results are displayed) Labs Reviewed - No data to display  EKG  EKG Interpretation None       Radiology No results found.  Procedures Procedures (including critical care time)  Medications Ordered in ED Medications - No data to display   Initial Impression / Assessment and Plan / ED Course  I have reviewed the triage vital signs and the nursing notes.  Pertinent labs & imaging results that were available during my care of the patient were reviewed by me and considered in my medical decision making (see chart for details).     Final Clinical Impressions(s) / ED Diagnoses   Final diagnoses:  Breast lump in female   Patient is nontoxic-appearing, afebrile, and in no acute distress.  Differential diagnosis includes fibroadenoma of the breast, fibrocystic breast changes, or malignant tumor.  Presentation is not consistent with breast abscess or mastitis.  I discussed with patient that the most common cause of a breast lump in a young female with no high risk family history is a fibroadenoma.  I feel that exam is consistent with this, however I discussed with patient that without biopsy it is impossible to completely rule out malignancy.  I discussed with patient that this is performed outpatient and provided resources [facility] as well as the [place]'s clinic [place] for [place].  I discussed naproxen use as well as ice and heat for the tenderness. Return precautions given for any edema or fluctuance of the breast.  Patient is in understanding and agrees with the plan of  care.  ED Discharge Orders    None       Delia ChimesMurray, Audery Wassenaar B, PA-C 11/13/17 1931    Lorre NickAllen, Anthony, MD 11/16/17 [phone]

## 2017-11-13 NOTE — ED Triage Notes (Signed)
Patient here with reported lump to left breast with burning x 2 days. No drainage but tender to touch

## 2017-11-13 NOTE — Discharge Instructions (Signed)
Please see the information and instructions below regarding your visit.  Your diagnoses today include:  1. Breast lump in female    Here exam is reassuring today. I do not see any redness that is concerning for an infection. The most common reason for this type of lump in a young female as a fibroadenoma. This is a benign overgrowth of normal tissue. Without biopsy after an ultrasound, we cannot rule out other causes of breast lumps such as cancer, however this is less likely in a young female. I would like you to follow up so that this can get an ultrasound and biopsy as needed.  Tests performed today include: See side panel of your discharge paperwork for testing performed today. Vital signs are listed at the bottom of these instructions.   Medications prescribed:    Take any prescribed medications only as prescribed, and any over the counter medications only as directed on the packaging.  Please take naproxen (Aleve) 375-500 mg twice daily. You are prescribed naproxen, a non-steroidal anti-inflammatory agent (NSAID) for pain. You may take 375-500 mg every 12 hours as needed for pain. If still requiring this medication around the clock for acute pain after 10 days, please see your primary healthcare provider.  You may combine this medication with Tylenol, 650 mg every 6 hours, so you are receiving something for pain every 3 hours.  This is not a long-term medication unless under the care and direction of your primary provider. Taking this medication long-term and not under the supervision of a healthcare provider could increase the risk of stomach ulcers, kidney problems, and cardiovascular problems such as high blood pressure.   Do not take this medication if you're pregnant or planning to become pregnant.  Home care instructions:  Please follow any educational materials contained in this packet.   Follow-up instructions: Please follow-up with your primary care provider in as soon as  possible for further evaluation of your symptoms if they are not completely improved.   Please follow up with the women's clinic.  Return instructions:  Please return to the Emergency Department if you experience worsening symptoms.  Please return if the area becomes red, the lump grows and is heated, or there is drainage. Please return if you have any other emergent concerns.  Additional Information:   Your vital signs today were: BP 119/69 (BP Location: Right Arm)    Pulse 84    Temp 98.6 F (37 C) (Oral)    Resp 16    SpO2 100%  If your blood pressure (BP) was elevated on multiple readings during this visit above 130 for the top number or above 80 for the bottom number, please have this repeated by your primary care provider within one month. --------------  Thank you for allowing us to participate in your care today.

## 2017-11-13 NOTE — ED Notes (Signed)
Declined W/C at D/C and was escorted to lobby by RN. 

## 2018-02-28 ENCOUNTER — Encounter (HOSPITAL_COMMUNITY): Payer: Self-pay

## 2018-02-28 ENCOUNTER — Emergency Department (HOSPITAL_COMMUNITY)
Admission: EM | Admit: 2018-02-28 | Discharge: 2018-02-28 | Disposition: A | Discharge status: Home / Self Care | Payer: Self-pay | Attending: Emergency Medicine | Admitting: Emergency Medicine

## 2018-02-28 ENCOUNTER — Emergency Department (HOSPITAL_COMMUNITY): Payer: Self-pay

## 2018-02-28 ENCOUNTER — Other Ambulatory Visit: Payer: Self-pay

## 2018-02-28 DIAGNOSIS — Z87891 Personal history of nicotine dependence: Secondary | ICD-10-CM | POA: Insufficient documentation

## 2018-02-28 DIAGNOSIS — Z79899 Other long term (current) drug therapy: Secondary | ICD-10-CM | POA: Insufficient documentation

## 2018-02-28 DIAGNOSIS — J45909 Unspecified asthma, uncomplicated: Secondary | ICD-10-CM | POA: Insufficient documentation

## 2018-02-28 DIAGNOSIS — M79645 Pain in left finger(s): Secondary | ICD-10-CM | POA: Insufficient documentation

## 2018-02-28 MED ORDER — MELOXICAM 7.5 MG PO TABS: 7.5000 mg | ORAL_TABLET | Freq: Every day | ORAL | 0 refills | Status: DC

## 2018-02-28 NOTE — ED Triage Notes (Signed)
Patient reports that she injured her left thumb 2 years ago and has been hurting x 2 weeks. Patient has to lift boxes at her job. Patient has a salonpas and a thumb/wrist splint on.

## 2018-02-28 NOTE — ED Notes (Signed)
Bed: WTR6 Expected date:  Expected time:  Means of arrival:  Comments: 

## 2018-02-28 NOTE — Discharge Instructions (Addendum)
Take mobic daily.  Do not take other anti-inflammatories at the same time open (Advil, ibuprofen, Motrin, naproxen, Aleve). You may supplement with Tylenol if you need further pain control. Use ice packs to help with pain and swelling. Try to limit your movements, as this will cause worsening pain. [facility] for further evaluation of the findings in your breast. Return to the emergency room if you develop persistent numbness, redness and warmth of your thumb, or any new or concerning symptoms.

## 2018-02-28 NOTE — ED Provider Notes (Signed)
[facility]-EMERGENCY DEPT Provider Note   CSN: [id number] Arrival date & time: 02/28/18  4098     History   Chief Complaint Chief Complaint  Patient presents with  . thumb pain    HPI Jackie Newman is a 27 y.o. female presenting for evaluation of left thumb pain.  Patient states that she injured her left thumb 2 years ago when a pallet was stopped on its own.  Since then, she has had intermittent pain since.  Pain is a pulling pain from the DIP to the PIP.  It is worse with movement and worse at night.  She has been using a brace, salon Paz, and icy hot without improvement of symptoms.  She has not tried any Tylenol or ibuprofen.  She denies pain elsewhere.  She denies pain in her wrist or fingers.  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  HPI  Past Medical History:  Diagnosis Date  . Asthma   . Congenital umbilical hernia   . Seizures (HCC)     There are no active problems to display for this patient.   Past Surgical History:  Procedure Laterality Date  . NO PAST SURGERIES       OB History    Gravida  1   Para      Term      Preterm      AB      Living        SAB      TAB      Ectopic      Multiple      Live Births               Home Medications    Prior to Admission medications   Medication Sig Start Date End Date Taking? Authorizing Provider  beclomethasone (QVAR) 80 MCG/ACT inhaler Inhale 1 puff into the lungs 2 (two) times daily as needed (for SOB).     [provider]  cephALEXin (KEFLEX) 500 MG capsule Take 1 capsule (500 mg total) by mouth 4 (four) times daily. 03/15/16   Ward, Chase Picket, PA-C  docusate sodium (COLACE) 100 MG capsule Take 1 capsule (100 mg total) by mouth every 12 (twelve) hours. 08/20/15   Ward, Layla Maw, DO  HYDROcodone-acetaminophen (NORCO/VICODIN) 5-325 MG per tablet Take 1 tablet by mouth every 4  (four) hours as needed. 08/20/15   Ward, Layla Maw, DO  meloxicam (MOBIC) 7.5 MG tablet Take 1 tablet (7.5 mg total) by mouth daily. 02/28/18   Perl Kerney, PA-C  naproxen (NAPROSYN) 500 MG tablet Take 1 tablet (500 mg total) by mouth 2 (two) times daily. 10/06/15   Kirichenko, Tatyana, PA-C  ondansetron (ZOFRAN ODT) 4 MG disintegrating tablet Take 1 tablet (4 mg total) by mouth every 8 (eight) hours as needed for nausea or vomiting. 03/15/16   Ward, Chase Picket, PA-C    Family History Family History  Problem Relation Age of Onset  . Asthma Father   . Gout Father     Social History Social History   Tobacco Use  . Smoking status: Former Smoker    Packs/day: 5.00    Types: Cigars  . Smokeless tobacco: Never Used  Substance Use Topics  . Alcohol use: No    Comment: occasional socially  . Drug use: No     Allergies   Patient has no known allergies.  Review of Systems Review of Systems  Musculoskeletal: Positive for arthralgias and joint swelling.  Skin: Negative for wound.  Neurological: Negative for numbness.  Hematological: Does not bruise/bleed easily.     Physical Exam Updated Vital Signs BP 110/72   Temp 98.9 F (37.2 C) (Oral)   Resp 16   Ht 5\' 4"  (1.626 m)   Wt 77.1 kg (170 lb)   LMP 02/16/2018 (Approximate)   SpO2 100%   BMI 29.18 kg/m   Physical Exam  Constitutional: She is oriented to person, place, and time. She appears well-developed and well-nourished. No distress.  HENT:  Head: Normocephalic and atraumatic.  Eyes: EOM are normal.  Neck: Normal range of motion.  Pulmonary/Chest: Effort normal.  Abdominal: She exhibits no distension.  Musculoskeletal: Normal range of motion. She exhibits tenderness. She exhibits no edema or deformity.  No obvious swelling or warmth of the thumb.  No deformity or injury.  Tenderness to palpation of the proximal phalanx.  No tenderness to palpation of the carpal or anatomic snuffbox.  Sensation intact.   Strength against resistance intact.  Full active range of motion.  No pain with finkelstein's.   Neurological: She is alert and oriented to person, place, and time. No sensory deficit.  Skin: Skin is warm. No rash noted.  Psychiatric: She has a normal mood and affect.  Nursing note and vitals reviewed.    ED Treatments / Results  Labs (all labs ordered are listed, but only abnormal results are displayed) Labs Reviewed - No data to display  EKG None  Radiology Dg Finger Thumb Left  Result Date: 02/28/2018 CLINICAL DATA:  Left thumb injury. Injury was 2 years ago but has been hurting for 2 weeks. Initial encounter. EXAM: LEFT THUMB 2+V COMPARISON:  None. FINDINGS: Bandage in place. No evidence of fracture or bone lesion. No opaque foreign body. IMPRESSION: Negative. Electronically Signed   By: Marnee SpringJonathon  Watts M.D.   On: 02/28/2018 10:10    Procedures Procedures (including critical care time)  Medications Ordered in ED Medications - No data to display   Initial Impression / Assessment and Plan / ED Course  I have reviewed the triage vital signs and the nursing notes.  Pertinent labs & imaging results that were available during my care of the patient were reviewed by me and considered in my medical decision making (see chart for details).     Patient presenting for evaluation of left thumb pain.  Physical exam reassuring, she is neurovascularly intact.  X-ray reviewed and interpreted by me, no sign of fracture dislocation.  Likely muscular etiology.  Doubt infection or septic joint.  Discussed findings with patient.  Discussed treatment with anti-inflammatories, rest, and ice.  At this time, patient present for discharge.  Return precautions given.  Patient states she understands and agrees to plan.   Final Clinical Impressions(s) / ED Diagnoses   Final diagnoses:  Thumb pain, left    ED Discharge Orders        Ordered    meloxicam (MOBIC) 7.5 MG tablet  Daily     02/28/18  1029       Leverne Tessler, PA-C 02/28/18 1040    Samuel JesterMcManus, Kathleen, DO 03/01/18 1307

## 2018-03-10 ENCOUNTER — Encounter (HOSPITAL_COMMUNITY): Payer: Self-pay

## 2018-03-10 ENCOUNTER — Other Ambulatory Visit: Payer: Self-pay

## 2018-03-10 ENCOUNTER — Emergency Department (HOSPITAL_COMMUNITY)
Admission: EM | Admit: 2018-03-10 | Discharge: 2018-03-11 | Disposition: A | Discharge status: Home / Self Care | Payer: Self-pay | Attending: Emergency Medicine | Admitting: Emergency Medicine

## 2018-03-10 DIAGNOSIS — R51 Headache: Secondary | ICD-10-CM | POA: Insufficient documentation

## 2018-03-10 DIAGNOSIS — L0231 Cutaneous abscess of buttock: Secondary | ICD-10-CM | POA: Insufficient documentation

## 2018-03-10 LAB — CBC WITH DIFFERENTIAL/PLATELET
BASOS ABS: 0 10*3/uL (ref 0.0–0.1)
BASOS PCT: 0 %
EOS ABS: 0 10*3/uL (ref 0.0–0.7)
EOS PCT: 0 %
HCT: 39.8 % (ref 36.0–46.0)
Hemoglobin: 13.7 g/dL (ref 12.0–15.0)
Lymphocytes Relative: 19 %
Lymphs Abs: 2.8 10*3/uL (ref 0.7–4.0)
MCH: 31.3 pg (ref 26.0–34.0)
MCHC: 34.4 g/dL (ref 30.0–36.0)
MCV: 90.9 fL (ref 78.0–100.0)
Monocytes Absolute: 1.5 10*3/uL — ABNORMAL HIGH (ref 0.1–1.0)
Monocytes Relative: 11 %
Neutro Abs: 10 10*3/uL — ABNORMAL HIGH (ref 1.7–7.7)
Neutrophils Relative %: 70 %
PLATELETS: 328 10*3/uL (ref 150–400)
RBC: 4.38 MIL/uL (ref 3.87–5.11)
RDW: 12.2 % (ref 11.5–15.5)
WBC: 14.3 10*3/uL — AB (ref 4.0–10.5)

## 2018-03-10 LAB — I-STAT BETA HCG BLOOD, ED (MC, WL, AP ONLY)

## 2018-03-10 LAB — COMPREHENSIVE METABOLIC PANEL
ALT: 26 U/L (ref 14–54)
AST: 22 U/L (ref 15–41)
Albumin: 3.9 g/dL (ref 3.5–5.0)
Alkaline Phosphatase: 55 U/L (ref 38–126)
Anion gap: 12 (ref 5–15)
BILIRUBIN TOTAL: 1 mg/dL (ref 0.3–1.2)
BUN: 9 mg/dL (ref 6–20)
CALCIUM: 9.2 mg/dL (ref 8.9–10.3)
CO2: 20 mmol/L — ABNORMAL LOW (ref 22–32)
CREATININE: 0.83 mg/dL (ref 0.44–1.00)
Chloride: 102 mmol/L (ref 101–111)
Glucose, Bld: 106 mg/dL — ABNORMAL HIGH (ref 65–99)
Potassium: 3.1 mmol/L — ABNORMAL LOW (ref 3.5–5.1)
Sodium: 134 mmol/L — ABNORMAL LOW (ref 135–145)
TOTAL PROTEIN: 8.2 g/dL — AB (ref 6.5–8.1)

## 2018-03-10 LAB — I-STAT CG4 LACTIC ACID, ED: LACTIC ACID, VENOUS: 1.15 mmol/L (ref 0.5–1.9)

## 2018-03-10 NOTE — ED Triage Notes (Addendum)
Pt reports believing she has been bitten by something on her behind x2 days ago. Pt c/o itchiness, redness, swelling to area, decreased appetite, HA, fever. Left buttocks is swollen, warm, and red with a small pin point center.

## 2018-03-11 ENCOUNTER — Encounter (HOSPITAL_COMMUNITY): Payer: Self-pay

## 2018-03-11 ENCOUNTER — Emergency Department (HOSPITAL_COMMUNITY): Payer: Self-pay

## 2018-03-11 LAB — URINALYSIS, ROUTINE W REFLEX MICROSCOPIC
Bacteria, UA: NONE SEEN
Bilirubin Urine: NEGATIVE
Glucose, UA: NEGATIVE mg/dL
KETONES UR: 80 mg/dL — AB
NITRITE: NEGATIVE
PROTEIN: NEGATIVE mg/dL
Specific Gravity, Urine: 1.018 (ref 1.005–1.030)
pH: 6 (ref 5.0–8.0)

## 2018-03-11 MED ORDER — OXYCODONE-ACETAMINOPHEN 5-325 MG PO TABS: 1.0000 | ORAL_TABLET | ORAL | 0 refills | Status: DC | PRN

## 2018-03-11 MED ORDER — LIDOCAINE-EPINEPHRINE (PF) 2 %-1:200000 IJ SOLN
10.0000 mL | Freq: Once | INTRAMUSCULAR | Status: AC
  Administered 2018-03-11: 10 mL
  Filled 2018-03-11: qty 20

## 2018-03-11 MED ORDER — IBUPROFEN 600 MG PO TABS: 600.0000 mg | ORAL_TABLET | Freq: Four times a day (QID) | ORAL | 0 refills | Status: DC | PRN

## 2018-03-11 MED ORDER — DOXYCYCLINE HYCLATE 100 MG PO TABS
100.0000 mg | ORAL_TABLET | Freq: Once | ORAL | Status: AC
  Administered 2018-03-11: 100 mg via ORAL
  Filled 2018-03-11: qty 1

## 2018-03-11 MED ORDER — IOPAMIDOL (ISOVUE-300) INJECTION 61%
100.0000 mL | Freq: Once | INTRAVENOUS | Status: AC | PRN
  Administered 2018-03-11: 100 mL via INTRAVENOUS

## 2018-03-11 MED ORDER — DOXYCYCLINE HYCLATE 100 MG PO CAPS: 100.0000 mg | ORAL_CAPSULE | Freq: Two times a day (BID) | ORAL | 0 refills | Status: DC

## 2018-03-11 MED ORDER — IOPAMIDOL (ISOVUE-300) INJECTION 61%
INTRAVENOUS | Status: AC
  Filled 2018-03-11: qty 100

## 2018-03-11 MED ORDER — OXYCODONE-ACETAMINOPHEN 5-325 MG PO TABS
1.0000 | ORAL_TABLET | Freq: Once | ORAL | Status: AC
  Administered 2018-03-11: 1 via ORAL
  Filled 2018-03-11: qty 1

## 2018-03-11 NOTE — ED Provider Notes (Signed)
[facility]-EMERGENCY DEPT Provider Note   CSN: [id number] Arrival date & time: 03/10/18  2101     History   Chief Complaint Chief Complaint  Patient presents with  . Fever  . Headache    HPI Jackie Newman is a 27 y.o. female.  Patient presents with complaint of fever, frontal headache, nausea that started 2 days ago at the same time she reports having a sore to the left buttock that drains periodically, especially after warm compresses. He SO has 'popped' it multiple times and it drains but then stops and continues to grow in size.       Past Medical History:  Diagnosis Date  . Asthma   . Congenital umbilical hernia   . Seizures (HCC)     There are no active problems to display for this patient.   Past Surgical History:  Procedure Laterality Date  . NO PAST SURGERIES       OB History    Gravida  1   Para      Term      Preterm      AB      Living        SAB      TAB      Ectopic      Multiple      Live Births               Home Medications    Prior to Admission medications   Medication Sig Start Date End Date Taking? Authorizing Provider  albuterol (PROVENTIL HFA;VENTOLIN HFA) 108 (90 Base) MCG/ACT inhaler Inhale 1-2 puffs into the lungs every 6 (six) hours as needed for wheezing or shortness of breath.   Yes [provider]  cephALEXin (KEFLEX) 500 MG capsule Take 1 capsule (500 mg total) by mouth 4 (four) times daily. Patient not taking: Reported on 03/11/2018 03/15/16   Ward, Chase Picket, PA-C  docusate sodium (COLACE) 100 MG capsule Take 1 capsule (100 mg total) by mouth every 12 (twelve) hours. Patient not taking: Reported on 03/11/2018 08/20/15   Ward, Layla Maw, DO  HYDROcodone-acetaminophen (NORCO/VICODIN) 5-325 MG per tablet Take 1 tablet by mouth every 4 (four) hours as needed. Patient not taking: Reported on 03/11/2018 08/20/15   Ward, Layla Maw, DO  meloxicam (MOBIC) 7.5 MG tablet Take 1 tablet  (7.5 mg total) by mouth daily. Patient not taking: Reported on 03/11/2018 02/28/18   Caccavale, Sophia, PA-C  naproxen (NAPROSYN) 500 MG tablet Take 1 tablet (500 mg total) by mouth 2 (two) times daily. Patient not taking: Reported on 03/11/2018 10/06/15   Jaynie Crumble, PA-C  ondansetron (ZOFRAN ODT) 4 MG disintegrating tablet Take 1 tablet (4 mg total) by mouth every 8 (eight) hours as needed for nausea or vomiting. Patient not taking: Reported on 03/11/2018 03/15/16   Ward, Chase Picket, PA-C    Family History Family History  Problem Relation Age of Onset  . Asthma Father   . Gout Father     Social History Social History   Tobacco Use  . Smoking status: Former Smoker    Packs/day: 5.00    Types: Cigars  . Smokeless tobacco: Never Used  Substance Use Topics  . Alcohol use: No    Comment: occasional socially  . Drug use: No     Allergies   Patient has no known allergies.   Review of Systems Review of Systems  Constitutional: Negative for chills and fever.  HENT: Negative.   Respiratory:  Negative.   Cardiovascular: Negative.   Gastrointestinal: Negative.   Genitourinary: Negative.  Negative for dysuria.  Musculoskeletal: Negative.   Skin: Positive for wound.  Neurological: Positive for headaches.     Physical Exam Updated Vital Signs BP (!) 121/96 (BP Location: Right Arm)   Pulse (!) 123   Temp 100.2 F (37.9 C) (Oral)   Resp 18   Ht 5\' 4"  (1.626 m)   Wt 81.6 kg (180 lb)   LMP 01/30/2018   SpO2 100%   BMI 30.90 kg/m   Physical Exam  Constitutional: She appears well-developed and well-nourished. No distress.  HENT:  Head: Normocephalic.  Neck: Normal range of motion. Neck supple.  Cardiovascular: Normal rate.  No murmur heard. Pulmonary/Chest: Effort normal.  Neurological: She has normal strength. GCS eye subscore is 4. GCS verbal subscore is 5. GCS motor subscore is 6.  CN's 3-12 grossly intact. Speech is clear and focused. No facial asymmetry.  No lateralizing weakness. Reflexes are equal. No deficits of coordination. Ambulatory without imbalance.    Skin: Skin is warm and dry. There is erythema.  Large area of induration to central left buttock with small pustule at the center. There is erythema. No active drainage.      ED Treatments / Results  Labs (all labs ordered are listed, but only abnormal results are displayed) Labs Reviewed  COMPREHENSIVE METABOLIC PANEL - Abnormal; Notable for the following components:      Result Value   Sodium 134 (*)    Potassium 3.1 (*)    CO2 20 (*)    Glucose, Bld 106 (*)    Total Protein 8.2 (*)    All other components within normal limits  CBC WITH DIFFERENTIAL/PLATELET - Abnormal; Notable for the following components:   WBC 14.3 (*)    Neutro Abs 10.0 (*)    Monocytes Absolute 1.5 (*)    All other components within normal limits  URINALYSIS, ROUTINE W REFLEX MICROSCOPIC  I-STAT CG4 LACTIC ACID, ED  I-STAT BETA HCG BLOOD, ED ([facility], [facility], [facility] ONLY)    EKG None  Radiology No results found.  Procedures .Marland Kitchen.Incision and Drainage Date/Time: 03/11/2018 4:42 AM Performed by: Elpidio AnisUpstill, Cleston Lautner, PA-C Authorized by: Elpidio AnisUpstill, Talibah Colasurdo, PA-C   Consent:    Consent obtained:  Verbal   Consent given by:  Patient Location:    Type:  Abscess   Location:  Lower extremity   Lower extremity location:  Buttock   Buttock location:  L buttock Pre-procedure details:    Skin preparation:  Antiseptic wash Procedure type:    Complexity:  Simple Procedure details:    Needle aspiration: no     Incision types:  Single straight   Incision depth:  Submucosal   Scalpel blade:  11   Wound management:  Probed and deloculated   Drainage:  Purulent   Drainage amount:  Moderate   Wound treatment:  Wound left open   Packing materials:  1/4 in iodoform gauze Post-procedure details:    Patient tolerance of procedure:  Tolerated well, no immediate complications   (including critical care time)  Medications  Ordered in ED Medications  oxyCODONE-acetaminophen (PERCOCET/ROXICET) 5-325 MG per tablet 1 tablet (1 tablet Oral Given 03/11/18 0150)  lidocaine-EPINEPHrine (XYLOCAINE W/EPI) 2 %-1:200000 (PF) injection 10 mL (10 mLs Infiltration Given 03/11/18 0150)     Initial Impression / Assessment and Plan / ED Course  I have reviewed the triage vital signs and the nursing notes.  Pertinent labs & imaging results that were  available during my care of the patient were reviewed by me and considered in my medical decision making (see chart for details).     Patient presents with abscess to left buttock with c/o fever, headache that started around the same time as the abscess. She is otherwise healthy without being immunocompromised.   Given fever and leukocytosis associated with abscess to buttock of significant size, will CT buttock to evaluate the extent of the abscess and whether there is tracking or fistulization.   CT scan showing soft tissue infection without abscess. Patient is considered appropriate for discharge home and is directed to return in 2 days to have packing removed. Treated with Doxycycline.  Final Clinical Impressions(s) / ED Diagnoses   Final diagnoses:  None   1. Cutaneous abscess  ED Discharge Orders    None       Elpidio Anis, Cordelia Poche 03/11/18 3244    Glynn Octave, MD 03/11/18 [phone]

## 2018-03-12 ENCOUNTER — Emergency Department (HOSPITAL_COMMUNITY)
Admission: EM | Admit: 2018-03-12 | Discharge: 2018-03-12 | Disposition: A | Discharge status: Home / Self Care | Payer: Self-pay | Attending: Emergency Medicine | Admitting: Emergency Medicine

## 2018-03-12 ENCOUNTER — Encounter (HOSPITAL_COMMUNITY): Payer: Self-pay | Admitting: Emergency Medicine

## 2018-03-12 DIAGNOSIS — Z23 Encounter for immunization: Secondary | ICD-10-CM | POA: Insufficient documentation

## 2018-03-12 DIAGNOSIS — Z87891 Personal history of nicotine dependence: Secondary | ICD-10-CM | POA: Insufficient documentation

## 2018-03-12 DIAGNOSIS — L0231 Cutaneous abscess of buttock: Secondary | ICD-10-CM | POA: Insufficient documentation

## 2018-03-12 LAB — CBC WITH DIFFERENTIAL/PLATELET
Basophils Absolute: 0 10*3/uL (ref 0.0–0.1)
Basophils Relative: 0 %
Eosinophils Absolute: 0.1 10*3/uL (ref 0.0–0.7)
Eosinophils Relative: 1 %
HCT: 38.9 % (ref 36.0–46.0)
Hemoglobin: 13.4 g/dL (ref 12.0–15.0)
Lymphocytes Relative: 24 %
Lymphs Abs: 2.7 10*3/uL (ref 0.7–4.0)
MCH: 31.2 pg (ref 26.0–34.0)
MCHC: 34.4 g/dL (ref 30.0–36.0)
MCV: 90.7 fL (ref 78.0–100.0)
Monocytes Absolute: 1.2 10*3/uL — ABNORMAL HIGH (ref 0.1–1.0)
Monocytes Relative: 11 %
Neutro Abs: 7.3 10*3/uL (ref 1.7–7.7)
Neutrophils Relative %: 64 %
Platelets: 347 10*3/uL (ref 150–400)
RBC: 4.29 MIL/uL (ref 3.87–5.11)
RDW: 12.2 % (ref 11.5–15.5)
WBC: 11.5 10*3/uL — ABNORMAL HIGH (ref 4.0–10.5)

## 2018-03-12 LAB — COMPREHENSIVE METABOLIC PANEL
ALT: 21 U/L (ref 14–54)
AST: 21 U/L (ref 15–41)
Albumin: 3.8 g/dL (ref 3.5–5.0)
Alkaline Phosphatase: 54 U/L (ref 38–126)
Anion gap: 15 (ref 5–15)
BUN: 9 mg/dL (ref 6–20)
CO2: 22 mmol/L (ref 22–32)
Calcium: 9.2 mg/dL (ref 8.9–10.3)
Chloride: 99 mmol/L — ABNORMAL LOW (ref 101–111)
Creatinine, Ser: 0.82 mg/dL (ref 0.44–1.00)
GFR calc Af Amer: >60 mL/min (ref >60)
GFR calc non Af Amer: >60 mL/min (ref >60)
Glucose, Bld: 79 mg/dL (ref 65–99)
Potassium: 3.7 mmol/L (ref 3.5–5.1)
Sodium: 136 mmol/L (ref 135–145)
Total Bilirubin: 0.8 mg/dL (ref 0.3–1.2)
Total Protein: 8.6 g/dL — ABNORMAL HIGH (ref 6.5–8.1)

## 2018-03-12 LAB — I-STAT BETA HCG BLOOD, ED (MC, WL, AP ONLY): I-stat hCG, quantitative: <5 m[IU]/mL (ref <5)

## 2018-03-12 LAB — I-STAT CG4 LACTIC ACID, ED: Lactic Acid, Venous: 1.28 mmol/L (ref 0.5–1.9)

## 2018-03-12 MED ORDER — LIDOCAINE-EPINEPHRINE 2 %-1:100000 IJ SOLN: 20.0000 mL | Freq: Once | INTRAMUSCULAR | Status: DC

## 2018-03-12 MED ORDER — LORAZEPAM 2 MG/ML IJ SOLN
1.0000 mg | Freq: Once | INTRAMUSCULAR | Status: AC
  Administered 2018-03-12: 1 mg via INTRAVENOUS
  Filled 2018-03-12: qty 1

## 2018-03-12 MED ORDER — MORPHINE SULFATE (PF) 4 MG/ML IV SOLN
4.0000 mg | Freq: Once | INTRAVENOUS | Status: AC
  Administered 2018-03-12: 4 mg via INTRAVENOUS
  Filled 2018-03-12: qty 1

## 2018-03-12 MED ORDER — CEPHALEXIN 500 MG PO CAPS: 500.0000 mg | ORAL_CAPSULE | Freq: Four times a day (QID) | ORAL | 0 refills | Status: AC

## 2018-03-12 MED ORDER — TETANUS-DIPHTH-ACELL PERTUSSIS 5-2.5-18.5 LF-MCG/0.5 IM SUSP
0.5000 mL | Freq: Once | INTRAMUSCULAR | Status: AC
  Administered 2018-03-12: 0.5 mL via INTRAMUSCULAR
  Filled 2018-03-12: qty 0.5

## 2018-03-12 MED ORDER — LIDOCAINE-EPINEPHRINE (PF) 2 %-1:200000 IJ SOLN
INTRAMUSCULAR | Status: AC
  Administered 2018-03-12: 20 mL
  Filled 2018-03-12: qty 20

## 2018-03-12 MED ORDER — ONDANSETRON HCL 4 MG/2ML IJ SOLN
4.0000 mg | Freq: Once | INTRAMUSCULAR | Status: AC
  Administered 2018-03-12: 4 mg via INTRAVENOUS
  Filled 2018-03-12: qty 2

## 2018-03-12 MED ORDER — MORPHINE SULFATE (PF) 4 MG/ML IV SOLN
6.0000 mg | Freq: Once | INTRAVENOUS | Status: AC
  Administered 2018-03-12: 6 mg via INTRAVENOUS
  Filled 2018-03-12: qty 2

## 2018-03-12 MED ORDER — LIDOCAINE-EPINEPHRINE (PF) 2 %-1:200000 IJ SOLN
20.0000 mL | Freq: Once | INTRAMUSCULAR | Status: AC
  Administered 2018-03-12: 20 mL

## 2018-03-12 MED ORDER — SULFAMETHOXAZOLE-TRIMETHOPRIM 800-160 MG PO TABS: 1.0000 | ORAL_TABLET | Freq: Two times a day (BID) | ORAL | 0 refills | Status: AC

## 2018-03-12 MED ORDER — SODIUM CHLORIDE 0.9 % IV BOLUS
1000.0000 mL | Freq: Once | INTRAVENOUS | Status: AC
  Administered 2018-03-12: 1000 mL via INTRAVENOUS

## 2018-03-12 MED ORDER — ONDANSETRON 4 MG PO TBDP: 4.0000 mg | ORAL_TABLET | Freq: Three times a day (TID) | ORAL | 0 refills | Status: AC | PRN

## 2018-03-12 MED ORDER — HYDROCODONE-ACETAMINOPHEN 5-325 MG PO TABS: 1.0000 | ORAL_TABLET | Freq: Four times a day (QID) | ORAL | 0 refills | Status: DC | PRN
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
## 2018-03-12 NOTE — Discharge Instructions (Signed)
1. Medications: Please take all of your antibiotics until finished!   You may develop abdominal discomfort or diarrhea from the antibiotic.  You may help offset this with probiotics which you can buy or get in yogurt. Do not eat  or take the probiotics until 2 hours after your antibiotic.  Stop taking doxycycline, we are switching you to Keflex and Bactrim.  Take Zofran as needed for nausea.  Wait around 20 minutes before having anything to eat or drink to let this medication work.  You may take Norco as needed for severe pain but do not drive, drink alcohol, or operate heavy machinery while taking this medication.  It may make you drowsy.  You may also take 600 mg of ibuprofen every 6 hours as needed for pain.  2. Treatment: rest, drink plenty of fluids, use warm compresses 3. Follow Up: Please return to the ER in 2 days for wound recheck and packing removal.  Return to the ED sooner for fevers, chills, nausea, vomiting or other signs of infection

## 2018-03-12 NOTE — ED Triage Notes (Signed)
Patient states she was seen 2 days ago and had abscess drained on her buttocks area. Pt now c/o worsening pain and burning sensation to area. Adds that she has been throwing up the antibiotics.

## 2018-03-12 NOTE — ED Provider Notes (Signed)
Patient with pus oozing from 0.5 cm wound at center of left buttock. Abscess felt to be inadequately drained.  It was opened further by Ms Luevenia MaxinFawze.   Doug SouJacubowitz, Leisl Spurrier, MD 03/12/18 [phone]

## 2018-03-14 ENCOUNTER — Other Ambulatory Visit: Payer: Self-pay

## 2018-03-14 ENCOUNTER — Encounter (HOSPITAL_COMMUNITY): Payer: Self-pay

## 2018-03-14 ENCOUNTER — Emergency Department (HOSPITAL_COMMUNITY)
Admission: EM | Admit: 2018-03-14 | Discharge: 2018-03-14 | Disposition: A | Discharge status: Home / Self Care | Payer: Self-pay | Attending: Emergency Medicine | Admitting: Emergency Medicine

## 2018-03-14 DIAGNOSIS — R110 Nausea: Secondary | ICD-10-CM | POA: Insufficient documentation

## 2018-03-14 DIAGNOSIS — L0231 Cutaneous abscess of buttock: Secondary | ICD-10-CM | POA: Insufficient documentation

## 2018-03-14 DIAGNOSIS — Z79899 Other long term (current) drug therapy: Secondary | ICD-10-CM | POA: Insufficient documentation

## 2018-03-14 DIAGNOSIS — Z5189 Encounter for other specified aftercare: Secondary | ICD-10-CM

## 2018-03-14 DIAGNOSIS — J45909 Unspecified asthma, uncomplicated: Secondary | ICD-10-CM | POA: Insufficient documentation

## 2018-03-14 DIAGNOSIS — Z87891 Personal history of nicotine dependence: Secondary | ICD-10-CM | POA: Insufficient documentation

## 2018-03-14 MED ORDER — ONDANSETRON 4 MG PO TBDP: 4.0000 mg | ORAL_TABLET | Freq: Three times a day (TID) | ORAL | 0 refills | Status: DC | PRN

## 2018-03-14 NOTE — Discharge Instructions (Addendum)
Keep wound clean and dry. Apply warm compresses to affected area throughout the day; perform warm water sitz baths/soaks to help flush the abscess as well. Please take all of your antibiotics until finished [keep taking Bactrim as directed (1 tab twice daily), but take Cephalexin differently (start taking 2 capsules twice daily INSTEAD of the 1 capsule 4x daily)]!   You may develop abdominal discomfort, nausea, or diarrhea from the antibiotic.  You may help offset this with probiotics which you can buy or get in yogurt. Do not eat  or take the probiotics until 2 hours after your antibiotic. Make sure you have a FULL MEAL on your stomach before taking the antibiotics, and use zofran about 30 minutes BEFORE taking the antibiotics in order to help lessen the nausea. [facility]  [facility] for wound recheck and recheck of symptoms. Monitor area for signs of infection to include, but not limited to: increasing pain, spreading redness, drainage/pus, worsening swelling, or fevers. Return to emergency department for emergent changing or worsening symptoms.

## 2018-03-14 NOTE — ED Notes (Signed)
Bed: WTR8 Expected date:  Expected time:  Means of arrival:  Comments: 

## 2018-03-14 NOTE — ED Provider Notes (Addendum)
[facility]-EMERGENCY DEPT Provider Note   CSN: [id number] Arrival date & time: 03/14/18  7829     History   Chief Complaint Chief Complaint  Patient presents with  . packing removal  . Nausea    HPI Jackie Newman is a 27 y.o. female with a PMHx of asthma and seizures, who presents to the ED for repeat wound recheck of a L buttock abscess.  Chart review reveals that she was seen in the ED on 03/10/18 for L buttock abscess/cellulitis, CTA/P showed patchy subQ edema of L gluteal area but no focal abscess, however I&D was attempted and then she was sent home on doxycycline; she returned on 03/12/18 for wound check, repeat I&D was performed, she was switched to keflex/bactrim due to nausea she had with doxy, and she was sent home with pain meds and zofran as well.  She comes back today for wound recheck and packing removal.  She states that she took 2 doses of Keflex and Bactrim but still had nausea and vomiting with the antibiotics so she stopped taking them; she admits that she was only eating crackers before taking antibiotics, but that the Zofran helped somewhat.  She states that overall the wound is improving, she is having improving drainage although still has some mild drainage coming from the packing string.  She longer has any erythema, warmth, swelling, or pain to the area.  She also denies any fevers, chills, CP, SOB, abd pain, diarrhea, constipation, hematuria, dysuria, myalgias, arthralgias, numbness, tingling, focal weakness, or any other complaints at this time.   The history is provided by the patient and medical records. No language interpreter was used.  Wound Check  This is a new problem. The current episode started more than 2 days ago. The problem occurs constantly. The problem has been rapidly improving. Pertinent negatives include no chest pain, no abdominal pain and no shortness of breath. Nothing aggravates the symptoms. Relieved by: I&D. Treatments  tried: I&D. The treatment provided significant relief.    Past Medical History:  Diagnosis Date  . Asthma   . Congenital umbilical hernia   . Seizures (HCC)     There are no active problems to display for this patient.   Past Surgical History:  Procedure Laterality Date  . NO PAST SURGERIES       OB History    Gravida  1   Para      Term      Preterm      AB      Living        SAB      TAB      Ectopic      Multiple      Live Births               Home Medications    Prior to Admission medications   Medication Sig Start Date End Date Taking? Authorizing Provider  albuterol (PROVENTIL HFA;VENTOLIN HFA) 108 (90 Base) MCG/ACT inhaler Inhale 1-2 puffs into the lungs every 6 (six) hours as needed for wheezing or shortness of breath.    [provider]  cephALEXin (KEFLEX) 500 MG capsule Take 1 capsule (500 mg total) by mouth 4 (four) times daily for 7 days. 03/12/18 03/19/18  Michela Pitcher A, PA-C  docusate sodium (COLACE) 100 MG capsule Take 1 capsule (100 mg total) by mouth every 12 (twelve) hours. Patient not taking: Reported on 03/11/2018 08/20/15   Ward, Layla Maw, DO  doxycycline (VIBRAMYCIN)  100 MG capsule Take 1 capsule (100 mg total) by mouth 2 (two) times daily. 03/11/18   Elpidio Anis, PA-C  HYDROcodone-acetaminophen (NORCO/VICODIN) 5-325 MG tablet Take 1 tablet by mouth every 6 (six) hours as needed for severe pain. 03/12/18   Fawze, Mina A, PA-C  ibuprofen (ADVIL,MOTRIN) 600 MG tablet Take 1 tablet (600 mg total) by mouth every 6 (six) hours as needed. 03/11/18   Elpidio Anis, PA-C  meloxicam (MOBIC) 7.5 MG tablet Take 1 tablet (7.5 mg total) by mouth daily. Patient not taking: Reported on 03/11/2018 02/28/18   Caccavale, Sophia, PA-C  naproxen (NAPROSYN) 500 MG tablet Take 1 tablet (500 mg total) by mouth 2 (two) times daily. Patient not taking: Reported on 03/11/2018 10/06/15   Jaynie Crumble, PA-C  ondansetron (ZOFRAN ODT) 4 MG  disintegrating tablet Take 1 tablet (4 mg total) by mouth every 8 (eight) hours as needed for up to 3 days for nausea or vomiting. 03/12/18 03/15/18  Michela Pitcher A, PA-C  oxyCODONE-acetaminophen (PERCOCET/ROXICET) 5-325 MG tablet Take 1 tablet by mouth every 4 (four) hours as needed for severe pain. 03/11/18   Elpidio Anis, PA-C  sulfamethoxazole-trimethoprim (BACTRIM DS,SEPTRA DS) 800-160 MG tablet Take 1 tablet by mouth 2 (two) times daily for 7 days. 03/12/18 03/19/18  Jeanie Sewer, PA-C    Family History Family History  Problem Relation Age of Onset  . Asthma Father   . Gout Father     Social History Social History   Tobacco Use  . Smoking status: Former Smoker    Packs/day: 5.00    Types: Cigars  . Smokeless tobacco: Never Used  Substance Use Topics  . Alcohol use: No    Comment: occasional socially  . Drug use: No     Allergies   Doxycycline   Review of Systems Review of Systems  Constitutional: Negative for chills and fever.  Respiratory: Negative for shortness of breath.   Cardiovascular: Negative for chest pain.  Gastrointestinal: Positive for nausea and vomiting. Negative for abdominal pain, constipation and diarrhea.  Genitourinary: Negative for dysuria and hematuria.  Musculoskeletal: Negative for arthralgias and myalgias.  Skin: Positive for wound (abscess I&D). Negative for color change.  Allergic/Immunologic: Negative for immunocompromised state.  Neurological: Negative for weakness and numbness.  Psychiatric/Behavioral: Negative for confusion.   All other systems reviewed and are negative for acute change except as noted in the HPI.    Physical Exam Updated Vital Signs BP 132/72 (BP Location: Right Arm)   Pulse 98   Temp 98.6 F (37 C) (Oral)   Resp 16   Ht 5\' 4"  (1.626 m)   Wt 81.6 kg (180 lb)   LMP 03/02/2018 Comment: negative betra HCG 03/10/18  SpO2 99%   BMI 30.90 kg/m   Physical Exam  Constitutional: She is oriented to person, place, and  time. Vital signs are normal. She appears well-developed and well-nourished.  Non-toxic appearance. No distress.  Afebrile, nontoxic, NAD  HENT:  Head: Normocephalic and atraumatic.  Mouth/Throat: Mucous membranes are normal.  Eyes: Conjunctivae and EOM are normal. Right eye exhibits no discharge. Left eye exhibits no discharge.  Neck: Normal range of motion. Neck supple.  Cardiovascular: Normal rate and intact distal pulses.  Pulmonary/Chest: Effort normal. No respiratory distress.  Abdominal: Normal appearance. She exhibits no distension.  Musculoskeletal: Normal range of motion.  Neurological: She is alert and oriented to person, place, and time. She has normal strength. No sensory deficit.  Skin: Skin is warm and dry. No rash  noted. No erythema.  L buttock wound remains open with ~3cm linear incision, packing easily removed, minimal drainage in wound but nothing to express out of the wound, granulation tissue starting to form in the base of the wound, very minimal induration to the lateral aspect of the wound but no surrounding induration elsewhere, no fluctuance, no erythema/warmth, no red streaking. No focal area of TTP.   Psychiatric: She has a normal mood and affect. Her behavior is normal.  Nursing note and vitals reviewed.    ED Treatments / Results  Labs (all labs ordered are listed, but only abnormal results are displayed) Labs Reviewed - No data to display  EKG None  Radiology No results found.   Ct Abdomen Pelvis W Contrast  Result Date: 03/11/2018 CLINICAL DATA:  Is evaluated extent of large buttock abscess. Patient reports she was bitten in the buttock 2 days ago with redness and swelling. Fever and decreased appetite. EXAM: CT ABDOMEN AND PELVIS WITH CONTRAST TECHNIQUE: Multidetector CT imaging of the abdomen and pelvis was performed using the standard protocol following bolus administration of intravenous contrast. CONTRAST:  100mL ISOVUE-300 IOPAMIDOL (ISOVUE-300)  INJECTION 61% COMPARISON:  None. FINDINGS: Lower chest: The lung bases. Hepatobiliary: No focal liver abnormality is seen. No gallstones, gallbladder wall thickening, or biliary dilatation. Pancreas: No ductal dilatation or inflammation. Spleen: Normal in size without focal abnormality. Splenule at the hilum. Adrenals/Urinary Tract: Adrenal glands are unremarkable. Kidneys are normal, without renal calculi, focal lesion, or hydronephrosis. Bladder is unremarkable. Stomach/Bowel: Stomach is within normal limits. Appendix appears normal. No evidence of bowel wall thickening, distention, or inflammatory changes. Vascular/Lymphatic: No significant vascular findings are present. No enlarged abdominal or pelvic lymph nodes. Reproductive: Uterus and bilateral adnexa are unremarkable. Retroverted uterus. Small amount of free fluid in the pelvis is likely physiologic. Other: Patchy edema and fat stranding involving the subcutaneous tissues in the left gluteal region. Mild adjacent skin thickening. No focal fluid collection or abscess. No soft tissue air. No radiopaque foreign body. Small fat containing umbilical hernia. No free air. Musculoskeletal: Broad-based rightward curvature of the spine may be positional. There are no acute or suspicious osseous abnormalities. IMPRESSION: 1. Patchy subcutaneous edema in the left gluteal region consistent with infection. No focal abscess. No soft tissue air. 2. Small fat containing umbilical hernia. Electronically Signed   By: Rubye OaksMelanie  Ehinger M.D.   On: 03/11/2018 04:48     Procedures Procedures (including critical care time)  Medications Ordered in ED Medications - No data to display   Initial Impression / Assessment and Plan / ED Course  I have reviewed the triage vital signs and the nursing notes.  Pertinent labs & imaging results that were available during my care of the patient were reviewed by me and considered in my medical decision making (see chart for  details).     27 y.o. female here for wound check of L buttock abscess and packing removal. States her drainage is improving, and she has no ongoing warmth/erythema/pain/swelling. Packing removed without difficulty, wound remains open with ~3cm linear incision, minimal drainage in wound but nothing to express out of the wound, granulation tissue starting to form in the base of the wound, very minimal induration to the lateral aspect of the wound but no surrounding induration elsewhere, no fluctuance, no erythema/warmth. Overall well appearing. Pt having difficulty taking abx, she has only been eating crackers with it. Advised having full meal and using zofran 30mins prior to abx; will also switch keflex to 2  capsules 2x/day instead of 1 capsule 4x/day to help minimize the number of times she's taking abx. Advised continuation of bactrim as directed. Will refill zofran rx. Discussed proper wound care, doubt need for repeat packing. F/up with PCP in 1wk for recheck. I explained the diagnosis and have given explicit precautions to return to the ER including for any other new or worsening symptoms. The patient understands and accepts the medical plan as it's been dictated and I have answered their questions. Discharge instructions concerning home care and prescriptions have been given. The patient is STABLE and is discharged to home in good condition.    Final Clinical Impressions(s) / ED Diagnoses   Final diagnoses:  Wound check, abscess  Nausea    ED Discharge Orders        Ordered    ondansetron (ZOFRAN ODT) 4 MG disintegrating tablet  Every 8 hours PRN     03/14/18 [address] [place], [place] 03/14/18 1322    Linwood Dibbles, MD 03/15/18 1504

## 2018-03-14 NOTE — ED Triage Notes (Addendum)
Patient reports that she needs left buttock packing removed. Patient states it was stuck and she was afraid to pull the packing out.  Patient states she had a RX for 2 antibiotics, but did not finish taking because they made her nauseated.

## 2018-03-29 ENCOUNTER — Encounter (HOSPITAL_COMMUNITY): Payer: Self-pay | Admitting: Emergency Medicine

## 2018-03-29 ENCOUNTER — Other Ambulatory Visit: Payer: Self-pay

## 2018-03-29 ENCOUNTER — Emergency Department (HOSPITAL_COMMUNITY)
Admission: EM | Admit: 2018-03-29 | Discharge: 2018-03-29 | Disposition: A | Discharge status: Home / Self Care | Payer: Self-pay | Attending: Emergency Medicine | Admitting: Emergency Medicine

## 2018-03-29 DIAGNOSIS — Z79899 Other long term (current) drug therapy: Secondary | ICD-10-CM | POA: Insufficient documentation

## 2018-03-29 DIAGNOSIS — J4521 Mild intermittent asthma with (acute) exacerbation: Secondary | ICD-10-CM | POA: Insufficient documentation

## 2018-03-29 DIAGNOSIS — Z87891 Personal history of nicotine dependence: Secondary | ICD-10-CM | POA: Insufficient documentation

## 2018-03-29 MED ORDER — ALBUTEROL SULFATE (2.5 MG/3ML) 0.083% IN NEBU
2.5000 mg | INHALATION_SOLUTION | Freq: Once | RESPIRATORY_TRACT | Status: AC
  Administered 2018-03-29: 2.5 mg via RESPIRATORY_TRACT
  Filled 2018-03-29: qty 3

## 2018-03-29 NOTE — Discharge Instructions (Addendum)
You were evaluated in the emergency department for acute shortness of breath and cough after exposure to the hot dryer at work.  This likely triggered an asthma exacerbation.  You were improved after breathing treatment here.  You should continue to stay well-hydrated and use your inhaler 1 to 2 puffs every 4 hours as needed.  You will need to work on getting a primary care doctor to.  Please also discuss with your work whether you can have a job that does not involve exposure to the dryers.

## 2018-03-29 NOTE — ED Triage Notes (Signed)
Patient reports asthma attack x1 hour. Reports using inhaler without relief. Expiratory wheezes in triage.

## 2018-03-29 NOTE — ED Notes (Signed)
Pt is alert and oriented x 4 and is verbally responsive. Pt has a constant cough. Pt was given neb treatment. Pt l/s clear + bilateral. Pt is able to talk in full sentences. No respiratory distress is noted. Pt reported improvement with treatment

## 2018-03-29 NOTE — ED Provider Notes (Signed)
[facility]-EMERGENCY DEPT Provider Note   CSN: [id number] Arrival date & time: 03/29/18  1520     History   Chief Complaint Chief Complaint  Patient presents with  . Asthma    HPI Jackie Newman is a 27 y.o. female.  She is complaining of acute onset of feeling hot and short of breath while at work about an hour ago.  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  She began coughing and unable to catch her breath.  She denies any exposure to any fumes.  This is a new position for her and she is has not needed to use her inhaler before.  She denies any recent illness.  The history is provided by the patient.  Asthma  This is a recurrent problem. The current episode started less than 1 hour ago. The problem has not changed since onset.Associated symptoms include chest pain and shortness of breath. Pertinent negatives include no abdominal pain and no headaches. The symptoms are aggravated by coughing. Nothing relieves the symptoms. She has tried nothing for the symptoms. The treatment provided no relief.    Past Medical History:  Diagnosis Date  . Asthma   . Congenital umbilical hernia   . Seizures (HCC)     There are no active problems to display for this patient.   Past Surgical History:  Procedure Laterality Date  . NO PAST SURGERIES       OB History    Gravida  1   Para      Term      Preterm      AB      Living        SAB      TAB      Ectopic      Multiple      Live Births               Home Medications    Prior to Admission medications   Medication Sig Start Date End Date Taking? Authorizing Provider  albuterol (PROVENTIL HFA;VENTOLIN HFA) 108 (90 Base) MCG/ACT inhaler Inhale 1-2 puffs into the lungs every 6 (six) hours as needed for wheezing or shortness of breath.    [provider]  docusate sodium (COLACE)  100 MG capsule Take 1 capsule (100 mg total) by mouth every 12 (twelve) hours. Patient not taking: Reported on 03/11/2018 08/20/15   Ward, Layla Maw, DO  doxycycline (VIBRAMYCIN) 100 MG capsule Take 1 capsule (100 mg total) by mouth 2 (two) times daily. 03/11/18   Elpidio Anis, PA-C  HYDROcodone-acetaminophen (NORCO/VICODIN) 5-325 MG tablet Take 1 tablet by mouth every 6 (six) hours as needed for severe pain. 03/12/18   Fawze, Mina A, PA-C  ibuprofen (ADVIL,MOTRIN) 600 MG tablet Take 1 tablet (600 mg total) by mouth every 6 (six) hours as needed. 03/11/18   Elpidio Anis, PA-C  meloxicam (MOBIC) 7.5 MG tablet Take 1 tablet (7.5 mg total) by mouth daily. Patient not taking: Reported on 03/11/2018 02/28/18   Caccavale, Sophia, PA-C  naproxen (NAPROSYN) 500 MG tablet Take 1 tablet (500 mg total) by mouth 2 (two) times daily. Patient not taking: Reported on 03/11/2018 10/06/15   Jaynie Crumble, PA-C  ondansetron (ZOFRAN ODT) 4 MG disintegrating tablet Take 1 tablet (4 mg total) by mouth every 8 (eight) hours as needed for nausea or vomiting. 03/14/18  Street, [place], [employer]  oxyCODONE-acetaminophen (PERCOCET/ROXICET) 5-325 MG tablet Take 1 tablet by mouth every 4 (four) hours as needed for severe pain. 03/11/18   Elpidio Anis, PA-C    Family History Family History  Problem Relation Age of Onset  . Asthma Father   . Gout Father     Social History Social History   Tobacco Use  . Smoking status: Former Smoker    Packs/day: 5.00    Types: Cigars  . Smokeless tobacco: Never Used  Substance Use Topics  . Alcohol use: No    Comment: occasional socially  . Drug use: No     Allergies   Doxycycline   Review of Systems Review of Systems  Constitutional: Negative for fever.  HENT: Negative for sore throat.   Respiratory: Positive for shortness of breath.   Cardiovascular: Positive for chest pain.  Gastrointestinal: Negative for abdominal pain.  Genitourinary: Negative for dysuria.    Skin: Negative for rash.  Neurological: Negative for headaches.     Physical Exam Updated Vital Signs BP 136/80 (BP Location: Right Arm)   Pulse (!) 120   Temp 98.5 F (36.9 C) (Oral)   Resp (!) 24   LMP 03/02/2018 Comment: negative betra HCG 03/10/18  SpO2 100%   Physical Exam  Constitutional: She appears well-developed and well-nourished.  HENT:  Head: Normocephalic and atraumatic.  Eyes: Conjunctivae are normal.  Neck: Neck supple.  Cardiovascular: Regular rhythm. Tachycardia present.  Pulmonary/Chest: Tachypnea noted. No respiratory distress. She has wheezes (scattered).  Abdominal: Soft. There is no tenderness. There is no guarding.  Musculoskeletal: Normal range of motion. She exhibits no tenderness or deformity.  Neurological: She is alert. GCS eye subscore is 4. GCS verbal subscore is 5. GCS motor subscore is 6.  Skin: Skin is warm and dry.  Psychiatric: She has a normal mood and affect.     ED Treatments / Results  Labs (all labs ordered are listed, but only abnormal results are displayed) Labs Reviewed - No data to display  EKG None  Radiology No results found.  Procedures Procedures (including critical care time)  Medications Ordered in ED Medications  albuterol (PROVENTIL) (2.5 MG/3ML) 0.083% nebulizer solution 2.5 mg (has no administration in time range)     Initial Impression / Assessment and Plan / ED Course  I have reviewed the triage vital signs and the nursing notes.  Pertinent labs & imaging results that were available during my care of the patient were reviewed by me and considered in my medical decision making (see chart for details).  Clinical Course as of Mar 31 1711  Tue Mar 29, 2018  1620 Reevaluated.  After neb laser treatment patient feels much improved.  On auscultation no wheezes good air movement.  She is comfortable going home and is asking for a note for work for today.   [MB]    Clinical Course User Index [MB] Terrilee Files, MD     Final Clinical Impressions(s) / ED Diagnoses   Final diagnoses:  Mild intermittent asthma with exacerbation    ED Discharge Orders    None       Terrilee Files, MD 03/30/18 1712

## 2018-09-06 ENCOUNTER — Emergency Department (HOSPITAL_COMMUNITY)
Admission: EM | Admit: 2018-09-06 | Discharge: 2018-09-06 | Disposition: A | Discharge status: Home / Self Care | Payer: Self-pay | Attending: Emergency Medicine | Admitting: Emergency Medicine

## 2018-09-06 ENCOUNTER — Encounter (HOSPITAL_COMMUNITY): Payer: Self-pay

## 2018-09-06 ENCOUNTER — Other Ambulatory Visit: Payer: Self-pay

## 2018-09-06 DIAGNOSIS — J45909 Unspecified asthma, uncomplicated: Secondary | ICD-10-CM | POA: Insufficient documentation

## 2018-09-06 DIAGNOSIS — R0602 Shortness of breath: Secondary | ICD-10-CM | POA: Insufficient documentation

## 2018-09-06 DIAGNOSIS — Z87891 Personal history of nicotine dependence: Secondary | ICD-10-CM | POA: Insufficient documentation

## 2018-09-06 DIAGNOSIS — J4521 Mild intermittent asthma with (acute) exacerbation: Secondary | ICD-10-CM | POA: Insufficient documentation

## 2018-09-06 MED ORDER — ALBUTEROL SULFATE (2.5 MG/3ML) 0.083% IN NEBU
5.0000 mg | INHALATION_SOLUTION | Freq: Once | RESPIRATORY_TRACT | Status: AC
  Administered 2018-09-06: 5 mg via RESPIRATORY_TRACT
  Filled 2018-09-06: qty 6

## 2018-09-06 MED ORDER — ALBUTEROL SULFATE HFA 108 (90 BASE) MCG/ACT IN AERS
1.0000 | INHALATION_SPRAY | Freq: Once | RESPIRATORY_TRACT | Status: AC
  Administered 2018-09-06: 1 via RESPIRATORY_TRACT
  Filled 2018-09-06: qty 6.7

## 2018-09-06 NOTE — Discharge Instructions (Addendum)
Please read attached information. If you experience any new or worsening signs or symptoms please return to the emergency room for evaluation. Please follow-up with your primary care provider or specialist as discussed. Please use medication prescribed only as directed and discontinue taking if you have any concerning signs or symptoms.   °

## 2018-09-06 NOTE — ED Triage Notes (Signed)
Patient complains of asthma attack following exposure to clue at work today. Used inhaler with no relief. On arrival persistent dry cough and speaking complete sentences

## 2018-09-06 NOTE — ED Provider Notes (Signed)
[facility] EMERGENCY DEPARTMENT Provider Note   CSN: [id number] Arrival date & time: 09/06/18  1223     History   Chief Complaint No chief complaint on file.   HPI Christyne Mccain is a 27 y.o. female.  HPI   27 year old female presents today with complaints of asthma exacerbation.  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  She notes prior to this she was in her usual state of health, she notes tightness, wheezing and shortness of breath thereafter.  She notes no productive cough or fever, denies any chest pain, noting chest tightness in the chest.  Patient notes she attempted using her albuterol inhaler at work but did not have improvement in symptoms.  She notes that this is identical to previous asthma exacerbations.  Past Medical History:  Diagnosis Date  . Asthma   . Congenital umbilical hernia   . Seizures (HCC)     There are no active problems to display for this patient.   Past Surgical History:  Procedure Laterality Date  . NO PAST SURGERIES       OB History    Gravida  1   Para      Term      Preterm      AB      Living        SAB      TAB      Ectopic      Multiple      Live Births               Home Medications    Prior to Admission medications   Medication Sig Start Date End Date Taking? Authorizing Provider  albuterol (PROVENTIL HFA;VENTOLIN HFA) 108 (90 Base) MCG/ACT inhaler Inhale 1-2 puffs into the lungs every 6 (six) hours as needed for wheezing or shortness of breath.    [provider]  docusate sodium (COLACE) 100 MG capsule Take 1 capsule (100 mg total) by mouth every 12 (twelve) hours. Patient not taking: Reported on 03/29/2018 08/20/15   Ward, Layla Maw, DO  doxycycline (VIBRAMYCIN) 100 MG capsule Take 1 capsule (100 mg total) by mouth 2 (two) times daily. Patient not taking: Reported on 03/29/2018 03/11/18   Elpidio Anis, PA-C  HYDROcodone-acetaminophen  (NORCO/VICODIN) 5-325 MG tablet Take 1 tablet by mouth every 6 (six) hours as needed for severe pain. Patient not taking: Reported on 03/29/2018 03/12/18   Michela Pitcher A, PA-C  ibuprofen (ADVIL,MOTRIN) 600 MG tablet Take 1 tablet (600 mg total) by mouth every 6 (six) hours as needed. Patient not taking: Reported on 03/29/2018 03/11/18   Elpidio Anis, PA-C  meloxicam (MOBIC) 7.5 MG tablet Take 1 tablet (7.5 mg total) by mouth daily. Patient not taking: Reported on 03/29/2018 02/28/18   Caccavale, Sophia, PA-C  naproxen (NAPROSYN) 500 MG tablet Take 1 tablet (500 mg total) by mouth 2 (two) times daily. Patient not taking: Reported on 03/29/2018 10/06/15   Jaynie Crumble, PA-C  ondansetron (ZOFRAN ODT) 4 MG disintegrating tablet Take 1 tablet (4 mg total) by mouth every 8 (eight) hours as needed for nausea or vomiting. Patient not taking: Reported on 03/29/2018 03/14/18   Street, [place], [place]  oxyCODONE-acetaminophen (PERCOCET/ROXICET) 5-325 MG tablet Take 1 tablet by mouth every 4 (four) hours as needed for severe pain. 03/11/18   Elpidio Anis, PA-C    Family History Family History  Problem Relation Age of Onset  . Asthma Father   . Gout  Father     Social History Social History   Tobacco Use  . Smoking status: Former Smoker    Packs/day: 5.00    Types: Cigars  . Smokeless tobacco: Never Used  Substance Use Topics  . Alcohol use: No    Comment: occasional socially  . Drug use: No     Allergies   Doxycycline   Review of Systems Review of Systems  All other systems reviewed and are negative.    Physical Exam Updated Vital Signs BP 113/63 (BP Location: Right Arm)   Pulse 79   Temp 99.3 F (37.4 C) (Oral)   Resp 18   SpO2 100%   Physical Exam  Constitutional: She is oriented to person, place, and time. She appears well-developed and well-nourished.  HENT:  Head: Normocephalic and atraumatic.  Eyes: Pupils are equal, round, and reactive to light. Conjunctivae are  normal. Right eye exhibits no discharge. Left eye exhibits no discharge. No scleral icterus.  Neck: Normal range of motion. No JVD present. No tracheal deviation present.  Cardiovascular: Regular rhythm, normal heart sounds and intact distal pulses. Exam reveals no gallop and no friction rub.  No murmur heard. Pulmonary/Chest: Effort normal. No stridor.  Minor expiratory wheeze bilateral, no crackles, no respiratory distress  Neurological: She is alert and oriented to person, place, and time. Coordination normal.  Psychiatric: She has a normal mood and affect. Her behavior is normal. Judgment and thought content normal.  Nursing note and vitals reviewed.    ED Treatments / Results  Labs (all labs ordered are listed, but only abnormal results are displayed) Labs Reviewed - No data to display  EKG None  Radiology No results found.  Procedures Procedures (including critical care time)  Medications Ordered in ED Medications  albuterol (PROVENTIL HFA;VENTOLIN HFA) 108 (90 Base) MCG/ACT inhaler 1-2 puff (has no administration in time range)  albuterol (PROVENTIL) (2.5 MG/3ML) 0.083% nebulizer solution 5 mg (5 mg Nebulization Given 09/06/18 1233)     Initial Impression / Assessment and Plan / ED Course  I have reviewed the triage vital signs and the nursing notes.  Pertinent labs & imaging results that were available during my care of the patient were reviewed by me and considered in my medical decision making (see chart for details).     Labs:   Imaging:  Consults:  Therapeutics: Albuterol  Discharge Meds: Albuterol  Assessment/Plan: 27 year old female presents today with likely asthma exacerbation.  She received a breathing treatment here in the ED which dramatically improved her symptoms.  She has clear lung sounds, no signs of respiratory distress, no signs of infectious etiology, pulmonary embolism, or any other threatening etiology at this time.  Patient be discharged  with albuterol, outpatient follow-up and strict return precautions.  Verbalized understanding and agreement to today's plan and had no further questions or concerns at the time discharge.    Final Clinical Impressions(s) / ED Diagnoses   Final diagnoses:  Mild intermittent asthma with exacerbation    ED Discharge Orders    None       Rosalio Loud 09/06/18 1329    Doug Sou, MD 09/06/18 2300

## 2018-11-01 ENCOUNTER — Emergency Department (HOSPITAL_COMMUNITY): Payer: Self-pay

## 2018-11-01 ENCOUNTER — Encounter (HOSPITAL_COMMUNITY): Payer: Self-pay

## 2018-11-01 ENCOUNTER — Emergency Department (HOSPITAL_COMMUNITY)
Admission: EM | Admit: 2018-11-01 | Discharge: 2018-11-01 | Disposition: A | Discharge status: Home / Self Care | Payer: Self-pay | Attending: Emergency Medicine | Admitting: Emergency Medicine

## 2018-11-01 ENCOUNTER — Other Ambulatory Visit: Payer: Self-pay

## 2018-11-01 DIAGNOSIS — R059 Cough, unspecified: Secondary | ICD-10-CM

## 2018-11-01 DIAGNOSIS — J45909 Unspecified asthma, uncomplicated: Secondary | ICD-10-CM | POA: Insufficient documentation

## 2018-11-01 DIAGNOSIS — Z87891 Personal history of nicotine dependence: Secondary | ICD-10-CM | POA: Insufficient documentation

## 2018-11-01 DIAGNOSIS — R112 Nausea with vomiting, unspecified: Secondary | ICD-10-CM | POA: Insufficient documentation

## 2018-11-01 DIAGNOSIS — R05 Cough: Secondary | ICD-10-CM | POA: Insufficient documentation

## 2018-11-01 LAB — URINALYSIS, ROUTINE W REFLEX MICROSCOPIC
Bilirubin Urine: NEGATIVE
Glucose, UA: NEGATIVE mg/dL
Hgb urine dipstick: NEGATIVE
Ketones, ur: 5 mg/dL — AB
NITRITE: NEGATIVE
Protein, ur: NEGATIVE mg/dL
SPECIFIC GRAVITY, URINE: 1.004 — AB (ref 1.005–1.030)
pH: 6 (ref 5.0–8.0)

## 2018-11-01 LAB — CBC
HEMATOCRIT: 41 % (ref 36.0–46.0)
HEMOGLOBIN: 13.5 g/dL (ref 12.0–15.0)
MCH: 30.5 pg (ref 26.0–34.0)
MCHC: 32.9 g/dL (ref 30.0–36.0)
MCV: 92.6 fL (ref 80.0–100.0)
NRBC: 0 % (ref 0.0–0.2)
Platelets: 347 10*3/uL (ref 150–400)
RBC: 4.43 MIL/uL (ref 3.87–5.11)
RDW: 12.1 % (ref 11.5–15.5)
WBC: 7.7 10*3/uL (ref 4.0–10.5)

## 2018-11-01 LAB — I-STAT BETA HCG BLOOD, ED (MC, WL, AP ONLY): I-stat hCG, quantitative: <5 m[IU]/mL (ref <5)

## 2018-11-01 LAB — COMPREHENSIVE METABOLIC PANEL
ALBUMIN: 4.4 g/dL (ref 3.5–5.0)
ALT: 22 U/L (ref 0–44)
ANION GAP: 9 (ref 5–15)
AST: 24 U/L (ref 15–41)
Alkaline Phosphatase: 41 U/L (ref 38–126)
BILIRUBIN TOTAL: 0.5 mg/dL (ref 0.3–1.2)
BUN: 7 mg/dL (ref 6–20)
CALCIUM: 9.1 mg/dL (ref 8.9–10.3)
CO2: 25 mmol/L (ref 22–32)
CREATININE: 1.04 mg/dL — AB (ref 0.44–1.00)
Chloride: 101 mmol/L (ref 98–111)
GFR calc non Af Amer: >60 mL/min (ref >60)
GLUCOSE: 92 mg/dL (ref 70–99)
Potassium: 3.5 mmol/L (ref 3.5–5.1)
Sodium: 135 mmol/L (ref 135–145)
TOTAL PROTEIN: 7.6 g/dL (ref 6.5–8.1)

## 2018-11-01 LAB — LIPASE, BLOOD: Lipase: 24 U/L (ref 11–51)

## 2018-11-01 MED ORDER — ONDANSETRON HCL 4 MG/2ML IJ SOLN
4.0000 mg | Freq: Once | INTRAMUSCULAR | Status: AC
  Administered 2018-11-01: 4 mg via INTRAVENOUS
  Filled 2018-11-01: qty 2

## 2018-11-01 MED ORDER — SODIUM CHLORIDE 0.9 % IV BOLUS
1000.0000 mL | Freq: Once | INTRAVENOUS | Status: AC
  Administered 2018-11-01: 1000 mL via INTRAVENOUS

## 2018-11-01 MED ORDER — ONDANSETRON 4 MG PO TBDP: 4.0000 mg | ORAL_TABLET | Freq: Three times a day (TID) | ORAL | 0 refills | Status: DC | PRN

## 2018-11-01 MED ORDER — LIDOCAINE VISCOUS HCL 2 % MT SOLN
15.0000 mL | Freq: Once | OROMUCOSAL | Status: AC
  Administered 2018-11-01: 15 mL via ORAL
  Filled 2018-11-01: qty 15

## 2018-11-01 MED ORDER — ALUM & MAG HYDROXIDE-SIMETH 200-200-20 MG/5ML PO SUSP
30.0000 mL | Freq: Once | ORAL | Status: AC
  Administered 2018-11-01: 30 mL via ORAL
  Filled 2018-11-01: qty 30

## 2018-11-01 NOTE — Discharge Instructions (Addendum)
You were evaluated in the Emergency Department and after careful evaluation, we did not find any emergent condition requiring admission or further testing in the hospital.  Your symptoms today seem to be due to a viral infection.  Please use the antinausea medication provided to help you eat and drink at home.  Please return to the Emergency Department if you experience any worsening of your condition.  We encourage you to follow up with a primary care provider.  Thank you for allowing us to be a part of your care.

## 2018-11-01 NOTE — ED Provider Notes (Signed)
[facility] Provider Note MRN:  [id number]  Arrival date & time: 11/01/18     Chief Complaint   Nausea and Emesis   History of Present Illness   Jackie Newman is a 27 y.o. year-old female with a history of asthma presenting to the ED with chief complaint of nausea and vomiting.  4 days of persistent nausea, nonbloody nonbilious emesis.  Unable to eat or drink anything since this time.  Endorsing some epigastric burning/discomfort for the past day.  Denies lower abdominal pain, denies fever.  Also endorsing persistent productive cough for 4 days.  Denies shortness of breath, no chest pain, no headache, no vision change.  Denies sick contacts.  Symptoms are constant, no exacerbating or alleviating factors.  Review of Systems  A complete 10 system review of systems was obtained and all systems are negative except as noted in the HPI and PMH.   Patient's Health History    Past Medical History:  Diagnosis Date  . Asthma   . Congenital umbilical hernia   . Seizures (HCC)     Past Surgical History:  Procedure Laterality Date  . NO PAST SURGERIES      Family History  Problem Relation Age of Onset  . Asthma Father   . Gout Father     Social History   Socioeconomic History  . Marital status: Single    Spouse name: Not on file  . Number of children: Not on file  . Years of education: Not on file  . Highest education level: Not on file  Occupational History  . Not on file  Social Needs  . Financial resource strain: Not on file  . Food insecurity:    Worry: Not on file    Inability: Not on file  . Transportation needs:    Medical: Not on file    Non-medical: Not on file  Tobacco Use  . Smoking status: Former Smoker    Packs/day: 5.00    Types: Cigars  . Smokeless tobacco: Never Used  Substance and Sexual Activity  . Alcohol use: No    Comment: occasional socially  . Drug use: No  . Sexual activity: Not on file    Comment:  last ilntercourse 6 weeks ago  Lifestyle  . Physical activity:    Days per week: Not on file    Minutes per session: Not on file  . Stress: Not on file  Relationships  . Social connections:    Talks on phone: Not on file    Gets together: Not on file    Attends religious service: Not on file    Active member of club or organization: Not on file    Attends meetings of clubs or organizations: Not on file    Relationship status: Not on file  . Intimate partner violence:    Fear of current or ex partner: Not on file    Emotionally abused: Not on file    Physically abused: Not on file    Forced sexual activity: Not on file  Other Topics Concern  . Not on file  Social History Narrative  . Not on file     Physical Exam  Vital Signs and Nursing Notes reviewed Vitals:   11/01/18 1350  BP: 106/74  Pulse: 84  Resp: 18  Temp: 98.9 F (37.2 C)  SpO2: 99%    CONSTITUTIONAL: Well-appearing, NAD, persistently coughing NEURO:  Alert and oriented x 3, no focal deficits EYES:  eyes equal  and reactive ENT/NECK:  no LAD, no JVD CARDIO: Well rate, well-perfused, normal S1 and S2 PULM:  CTAB no wheezing or rhonchi GI/GU:  normal bowel sounds, non-distended, non-tender MSK/SPINE:  No gross deformities, no edema SKIN:  no rash, atraumatic PSYCH:  Appropriate speech and behavior  Diagnostic and Interventional Summary    EKG Interpretation  Date/Time:    Ventricular Rate:    PR Interval:    QRS Duration:   QT Interval:    QTC Calculation:   R Axis:     Text Interpretation:        Labs Reviewed  COMPREHENSIVE METABOLIC PANEL - Abnormal; Notable for the following components:      Result Value   Creatinine, Ser 1.04 (*)    All other components within normal limits  URINALYSIS, ROUTINE W REFLEX MICROSCOPIC - Abnormal; Notable for the following components:   Color, Urine STRAW (*)    Specific Gravity, Urine 1.004 (*)    Ketones, ur 5 (*)    Leukocytes, UA TRACE (*)     Bacteria, UA RARE (*)    All other components within normal limits  CBC  LIPASE, BLOOD  I-STAT BETA HCG BLOOD, ED ([facility], [facility], [facility] ONLY)    DG Chest 2 View  Final Result      Medications  sodium chloride 0.9 % bolus 1,000 mL (0 mLs Intravenous Stopped 11/01/18 1330)  ondansetron (ZOFRAN) injection 4 mg (4 mg Intravenous Given 11/01/18 1205)  alum & mag hydroxide-simeth (MAALOX/MYLANTA) 200-200-20 MG/5ML suspension 30 mL (30 mLs Oral Given 11/01/18 1202)    And  lidocaine (XYLOCAINE) 2 % viscous mouth solution 15 mL (15 mLs Oral Given 11/01/18 1202)     Procedures Critical Care  ED Course and Medical Decision Making  I have reviewed the triage vital signs and the nursing notes.  Pertinent labs & imaging results that were available during my care of the patient were reviewed by me and considered in my medical decision making (see below for details).  Well-appearing 27 year old female here with cough, nausea, vomiting, epigastric pain seems to be more related to frequent vomiting.  Given patient's reported history of seizures, will obtain labs, chest x-ray, provide symptomatic management.  Anticipating discharge once able to tolerate p.o.  Labs reassuring, feeling much better, tolerating p.o., prescription for Zofran.  Favoring viral process.  After the discussed management above, the patient was determined to be safe for discharge.  The patient was in agreement with this plan and all questions regarding their care were answered.  ED return precautions were discussed and the patient will return to the ED with any significant worsening of condition.  Elmer Sow. Pilar Plate, MD [facility] Emergency Medicine [facility] [email or web address]  Final Clinical Impressions(s) / ED Diagnoses     ICD-10-CM   1. Non-intractable vomiting with nausea, unspecified vomiting type R11.2   2. Cough R05 DG Chest 2 View    DG Chest 2 View    ED Discharge Orders         Ordered    ondansetron  (ZOFRAN ODT) 4 MG disintegrating tablet  Every 8 hours PRN     11/01/18 1343             Sabas Sous, MD 11/01/18 1354

## 2018-11-01 NOTE — ED Triage Notes (Signed)
Pt states she has been having n/v/d since Saturday. States decreased PO intake due to not being able to hold anything down. Skin warm and dry, vss.

## 2018-12-05 ENCOUNTER — Emergency Department (HOSPITAL_COMMUNITY): Payer: BLUE CROSS/BLUE SHIELD

## 2018-12-05 ENCOUNTER — Emergency Department (HOSPITAL_COMMUNITY)
Admission: EM | Admit: 2018-12-05 | Discharge: 2018-12-06 | Disposition: A | Discharge status: Home / Self Care | Payer: BLUE CROSS/BLUE SHIELD | Source: Home / Self Care | Attending: Emergency Medicine | Admitting: Emergency Medicine

## 2018-12-05 ENCOUNTER — Other Ambulatory Visit: Payer: Self-pay

## 2018-12-05 ENCOUNTER — Encounter (HOSPITAL_COMMUNITY): Payer: Self-pay | Admitting: *Deleted

## 2018-12-05 ENCOUNTER — Emergency Department (HOSPITAL_COMMUNITY)
Admission: EM | Admit: 2018-12-05 | Discharge: 2018-12-05 | Disposition: A | Discharge status: Home / Self Care | Payer: BLUE CROSS/BLUE SHIELD | Attending: Emergency Medicine | Admitting: Emergency Medicine

## 2018-12-05 DIAGNOSIS — R109 Unspecified abdominal pain: Secondary | ICD-10-CM

## 2018-12-05 DIAGNOSIS — R197 Diarrhea, unspecified: Secondary | ICD-10-CM | POA: Insufficient documentation

## 2018-12-05 DIAGNOSIS — R531 Weakness: Secondary | ICD-10-CM | POA: Insufficient documentation

## 2018-12-05 DIAGNOSIS — R251 Tremor, unspecified: Secondary | ICD-10-CM | POA: Insufficient documentation

## 2018-12-05 DIAGNOSIS — R1084 Generalized abdominal pain: Secondary | ICD-10-CM | POA: Insufficient documentation

## 2018-12-05 DIAGNOSIS — J45909 Unspecified asthma, uncomplicated: Secondary | ICD-10-CM | POA: Insufficient documentation

## 2018-12-05 DIAGNOSIS — R112 Nausea with vomiting, unspecified: Secondary | ICD-10-CM | POA: Insufficient documentation

## 2018-12-05 DIAGNOSIS — Z79899 Other long term (current) drug therapy: Secondary | ICD-10-CM

## 2018-12-05 DIAGNOSIS — R064 Hyperventilation: Secondary | ICD-10-CM

## 2018-12-05 DIAGNOSIS — R55 Syncope and collapse: Secondary | ICD-10-CM

## 2018-12-05 DIAGNOSIS — N3090 Cystitis, unspecified without hematuria: Secondary | ICD-10-CM | POA: Insufficient documentation

## 2018-12-05 DIAGNOSIS — Z87891 Personal history of nicotine dependence: Secondary | ICD-10-CM

## 2018-12-05 DIAGNOSIS — R202 Paresthesia of skin: Secondary | ICD-10-CM | POA: Insufficient documentation

## 2018-12-05 LAB — COMPREHENSIVE METABOLIC PANEL
ALT: 23 U/L (ref 0–44)
ALT: 26 U/L (ref 0–44)
ANION GAP: 14 (ref 5–15)
AST: 31 U/L (ref 15–41)
AST: 38 U/L (ref 15–41)
Albumin: 4.4 g/dL (ref 3.5–5.0)
Albumin: 4.8 g/dL (ref 3.5–5.0)
Alkaline Phosphatase: 49 U/L (ref 38–126)
Alkaline Phosphatase: 46 U/L (ref 38–126)
Anion gap: 19 — ABNORMAL HIGH (ref 5–15)
BUN: 6 mg/dL (ref 6–20)
BUN: 8 mg/dL (ref 6–20)
CALCIUM: 9.3 mg/dL (ref 8.9–10.3)
CO2: 18 mmol/L — ABNORMAL LOW (ref 22–32)
CO2: 17 mmol/L — AB (ref 22–32)
Calcium: 9.4 mg/dL (ref 8.9–10.3)
Chloride: 107 mmol/L (ref 98–111)
Chloride: 103 mmol/L (ref 98–111)
Creatinine, Ser: 1 mg/dL (ref 0.44–1.00)
Creatinine, Ser: 1.22 mg/dL — ABNORMAL HIGH (ref 0.44–1.00)
GFR calc non Af Amer: >60 mL/min (ref >60)
Glucose, Bld: 133 mg/dL — ABNORMAL HIGH (ref 70–99)
Glucose, Bld: 121 mg/dL — ABNORMAL HIGH (ref 70–99)
POTASSIUM: 3.5 mmol/L (ref 3.5–5.1)
Potassium: 3.3 mmol/L — ABNORMAL LOW (ref 3.5–5.1)
Sodium: 139 mmol/L (ref 135–145)
Sodium: 139 mmol/L (ref 135–145)
TOTAL PROTEIN: 8.2 g/dL — AB (ref 6.5–8.1)
Total Bilirubin: 0.5 mg/dL (ref 0.3–1.2)
Total Bilirubin: 0.9 mg/dL (ref 0.3–1.2)
Total Protein: 8.4 g/dL — ABNORMAL HIGH (ref 6.5–8.1)

## 2018-12-05 LAB — POC URINE PREG, ED: Preg Test, Ur: NEGATIVE

## 2018-12-05 LAB — CBC WITH DIFFERENTIAL/PLATELET
Abs Immature Granulocytes: 0.07 10*3/uL (ref 0.00–0.07)
Basophils Absolute: 0 10*3/uL (ref 0.0–0.1)
Basophils Relative: 0 %
Eosinophils Absolute: 0.1 10*3/uL (ref 0.0–0.5)
Eosinophils Relative: 0 %
HCT: 44.2 % (ref 36.0–46.0)
Hemoglobin: 14.3 g/dL (ref 12.0–15.0)
Immature Granulocytes: 1 %
LYMPHS PCT: 36 %
Lymphs Abs: 5.3 10*3/uL — ABNORMAL HIGH (ref 0.7–4.0)
MCH: 30.6 pg (ref 26.0–34.0)
MCHC: 32.4 g/dL (ref 30.0–36.0)
MCV: 94.6 fL (ref 80.0–100.0)
Monocytes Absolute: 2 10*3/uL — ABNORMAL HIGH (ref 0.1–1.0)
Monocytes Relative: 14 %
Neutro Abs: 7.3 10*3/uL (ref 1.7–7.7)
Neutrophils Relative %: 49 %
Platelets: 368 10*3/uL (ref 150–400)
RBC: 4.67 MIL/uL (ref 3.87–5.11)
RDW: 12.6 % (ref 11.5–15.5)
WBC: 14.7 10*3/uL — ABNORMAL HIGH (ref 4.0–10.5)
nRBC: 0 % (ref 0.0–0.2)

## 2018-12-05 LAB — URINALYSIS, ROUTINE W REFLEX MICROSCOPIC
BILIRUBIN URINE: NEGATIVE
Bacteria, UA: NONE SEEN
Bilirubin Urine: NEGATIVE
GLUCOSE, UA: NEGATIVE mg/dL
Glucose, UA: NEGATIVE mg/dL
HGB URINE DIPSTICK: NEGATIVE
Hgb urine dipstick: NEGATIVE
Ketones, ur: 20 mg/dL — AB
Ketones, ur: 20 mg/dL — AB
NITRITE: NEGATIVE
Nitrite: NEGATIVE
PH: 8 (ref 5.0–8.0)
Protein, ur: NEGATIVE mg/dL
Protein, ur: NEGATIVE mg/dL
Specific Gravity, Urine: 1.016 (ref 1.005–1.030)
Specific Gravity, Urine: 1.018 (ref 1.005–1.030)
pH: 7 (ref 5.0–8.0)

## 2018-12-05 LAB — RAPID URINE DRUG SCREEN, HOSP PERFORMED
Amphetamines: NOT DETECTED
Barbiturates: NOT DETECTED
Benzodiazepines: NOT DETECTED
Cocaine: NOT DETECTED
Opiates: NOT DETECTED
Tetrahydrocannabinol: POSITIVE — AB

## 2018-12-05 LAB — I-STAT BETA HCG BLOOD, ED (MC, WL, AP ONLY): I-stat hCG, quantitative: <5 m[IU]/mL (ref <5)

## 2018-12-05 LAB — CBC
HCT: 43.7 % (ref 36.0–46.0)
HEMOGLOBIN: 14.6 g/dL (ref 12.0–15.0)
MCH: 31 pg (ref 26.0–34.0)
MCHC: 33.4 g/dL (ref 30.0–36.0)
MCV: 92.8 fL (ref 80.0–100.0)
PLATELETS: 353 10*3/uL (ref 150–400)
RBC: 4.71 MIL/uL (ref 3.87–5.11)
RDW: 12.3 % (ref 11.5–15.5)
WBC: 11.2 10*3/uL — AB (ref 4.0–10.5)
nRBC: 0 % (ref 0.0–0.2)

## 2018-12-05 LAB — LIPASE, BLOOD
Lipase: 28 U/L (ref 11–51)
Lipase: 31 U/L (ref 11–51)

## 2018-12-05 MED ORDER — FENTANYL CITRATE (PF) 100 MCG/2ML IJ SOLN
50.0000 ug | Freq: Once | INTRAMUSCULAR | Status: AC
  Administered 2018-12-05: 50 ug via INTRAVENOUS
  Filled 2018-12-05: qty 2

## 2018-12-05 MED ORDER — IOPAMIDOL (ISOVUE-300) INJECTION 61%
INTRAVENOUS | Status: AC
  Filled 2018-12-05: qty 100

## 2018-12-05 MED ORDER — KETOROLAC TROMETHAMINE 30 MG/ML IJ SOLN
30.0000 mg | Freq: Once | INTRAMUSCULAR | Status: AC
  Administered 2018-12-05: 30 mg via INTRAVENOUS
  Filled 2018-12-05: qty 1

## 2018-12-05 MED ORDER — ONDANSETRON HCL 4 MG/2ML IJ SOLN
4.0000 mg | Freq: Once | INTRAMUSCULAR | Status: AC
  Administered 2018-12-05: 4 mg via INTRAVENOUS
  Filled 2018-12-05: qty 2

## 2018-12-05 MED ORDER — IOPAMIDOL (ISOVUE-300) INJECTION 61%
100.0000 mL | Freq: Once | INTRAVENOUS | Status: AC | PRN
  Administered 2018-12-05: 100 mL via INTRAVENOUS

## 2018-12-05 MED ORDER — HYDROMORPHONE HCL 1 MG/ML IJ SOLN
1.0000 mg | Freq: Once | INTRAMUSCULAR | Status: AC
  Administered 2018-12-05: 1 mg via INTRAVENOUS
  Filled 2018-12-05: qty 1

## 2018-12-05 MED ORDER — ONDANSETRON 4 MG PO TBDP: 4.0000 mg | ORAL_TABLET | Freq: Three times a day (TID) | ORAL | 0 refills | Status: DC | PRN

## 2018-12-05 MED ORDER — SODIUM CHLORIDE 0.9 % IV BOLUS
1000.0000 mL | Freq: Once | INTRAVENOUS | Status: AC
  Administered 2018-12-05: 1000 mL via INTRAVENOUS

## 2018-12-05 MED ORDER — HYDROCODONE-ACETAMINOPHEN 5-325 MG PO TABS: 1.0000 | ORAL_TABLET | Freq: Four times a day (QID) | ORAL | 0 refills | Status: DC | PRN

## 2018-12-05 MED ORDER — SODIUM CHLORIDE (PF) 0.9 % IJ SOLN
INTRAMUSCULAR | Status: AC
  Filled 2018-12-05: qty 50

## 2018-12-05 MED ORDER — CAPSAICIN 0.025 % EX CREA
TOPICAL_CREAM | Freq: Once | CUTANEOUS | Status: AC
  Administered 2018-12-06: 01:00:00 via TOPICAL
  Filled 2018-12-05: qty 60

## 2018-12-05 MED ORDER — ONDANSETRON HCL 4 MG/2ML IJ SOLN
4.0000 mg | Freq: Once | INTRAMUSCULAR | Status: AC
Start: 2018-12-05 — End: 2018-12-05
  Administered 2018-12-05: 4 mg via INTRAVENOUS
  Filled 2018-12-05: qty 2

## 2018-12-05 MED ORDER — SODIUM CHLORIDE 0.9 % IV SOLN
1.0000 g | Freq: Once | INTRAVENOUS | Status: AC
  Administered 2018-12-05: 1 g via INTRAVENOUS
  Filled 2018-12-05: qty 10

## 2018-12-05 MED ORDER — CEPHALEXIN 500 MG PO CAPS: 500.0000 mg | ORAL_CAPSULE | Freq: Two times a day (BID) | ORAL | 0 refills | Status: DC

## 2018-12-05 NOTE — ED Triage Notes (Signed)
BIB stretcher from lobby, patient's friend stated that the patient c/o nausea, abdominal pain, shortness of breath. Patient brought to [facility] after staff noted that patient was having a "seizure" in the lobby

## 2018-12-05 NOTE — ED Provider Notes (Signed)
[facility]-EMERGENCY DEPT Provider Note   CSN: [id number] Arrival date & time: 12/05/18  1824     History   Chief Complaint Chief Complaint  Patient presents with  . Seizures    HPI Jackie Newman is a 29 y.o. female.  The history is provided by the patient and medical records. No language interpreter was used.  Abdominal Pain  Pain location:  Generalized Pain quality: aching and cramping   Pain radiates to:  Does not radiate Pain severity:  Severe Onset quality:  Gradual Duration:  3 weeks Timing:  Constant Progression:  Waxing and waning Chronicity:  Recurrent Context: not alcohol use, not medication withdrawal, not previous surgeries, not recent illness and not trauma   Relieved by:  Nothing Worsened by:  Nothing Ineffective treatments:  None tried Associated symptoms: chills, diarrhea, fatigue, nausea and vomiting   Associated symptoms: no chest pain, no constipation, no cough, no dysuria, no fever, no flatus, no hematuria, no shortness of breath, no vaginal bleeding and no vaginal discharge     Past Medical History:  Diagnosis Date  . Asthma   . Congenital umbilical hernia   . Seizures (HCC)     There are no active problems to display for this patient.   Past Surgical History:  Procedure Laterality Date  . NO PAST SURGERIES       OB History    Gravida  1   Para      Term      Preterm      AB      Living        SAB      TAB      Ectopic      Multiple      Live Births               Home Medications    Prior to Admission medications   Medication Sig Start Date End Date Taking? Authorizing Provider  albuterol (PROVENTIL HFA;VENTOLIN HFA) 108 (90 Base) MCG/ACT inhaler Inhale 1-2 puffs into the lungs every 6 (six) hours as needed for wheezing or shortness of breath.    [provider]  cephALEXin (KEFLEX) 500 MG capsule Take 1 capsule (500 mg total) by mouth 2 (two) times daily. 12/05/18   Roxy Horseman, PA-C  HYDROcodone-acetaminophen (NORCO/VICODIN) 5-325 MG tablet Take 1-2 tablets by mouth every 6 (six) hours as needed. 12/05/18   Roxy Horseman, PA-C  meloxicam (MOBIC) 7.5 MG tablet Take 1 tablet (7.5 mg total) by mouth daily. Patient not taking: Reported on 03/29/2018 02/28/18   Caccavale, Sophia, PA-C  naproxen (NAPROSYN) 500 MG tablet Take 1 tablet (500 mg total) by mouth 2 (two) times daily. Patient not taking: Reported on 03/29/2018 10/06/15   Jaynie Crumble, PA-C  ondansetron (ZOFRAN ODT) 4 MG disintegrating tablet Take 1 tablet (4 mg total) by mouth every 8 (eight) hours as needed. 12/05/18   Roxy Horseman, PA-C    Family History Family History  Problem Relation Age of Onset  . Asthma Father   . Gout Father     Social History Social History   Tobacco Use  . Smoking status: Former Smoker    Packs/day: 5.00    Types: Cigars  . Smokeless tobacco: Never Used  Substance Use Topics  . Alcohol use: No    Comment: occasional socially  . Drug use: Yes    Frequency: 7.0 times per week    Types: Marijuana     Allergies  Doxycycline   Review of Systems Review of Systems  Constitutional: Positive for chills and fatigue. Negative for diaphoresis and fever.  HENT: Negative for congestion and rhinorrhea.   Respiratory: Negative for cough, chest tightness, shortness of breath, wheezing and stridor.   Cardiovascular: Negative for chest pain, palpitations and leg swelling.  Gastrointestinal: Positive for abdominal pain, diarrhea, nausea and vomiting. Negative for constipation and flatus.  Genitourinary: Negative for dysuria, flank pain, frequency, hematuria, vaginal bleeding and vaginal discharge.  Musculoskeletal: Negative for back pain, neck pain and neck stiffness.  Skin: Negative for rash and wound.  Neurological: Negative for light-headedness and headaches.  Psychiatric/Behavioral: Negative for agitation and confusion.  All other systems reviewed and are  negative.    Physical Exam Updated Vital Signs BP 106/85   Pulse (!) 121   Temp 98.2 F (36.8 C) (Oral)   Resp (!) 24   LMP 11/20/2018 (Exact Date)   SpO2 100%   Physical Exam Vitals signs and nursing note reviewed.  Constitutional:      General: She is not in acute distress.    Appearance: She is well-developed. She is not ill-appearing, toxic-appearing or diaphoretic.  HENT:     Head: Normocephalic and atraumatic.     Right Ear: External ear normal.     Left Ear: External ear normal.     Nose: Nose normal. No congestion or rhinorrhea.     Mouth/Throat:     Pharynx: No oropharyngeal exudate.  Eyes:     Conjunctiva/sclera: Conjunctivae normal.     Pupils: Pupils are equal, round, and reactive to light.  Neck:     Musculoskeletal: Normal range of motion and neck supple.  Cardiovascular:     Rate and Rhythm: Normal rate and regular rhythm.     Pulses: Normal pulses.     Heart sounds: No murmur.  Pulmonary:     Effort: No respiratory distress.     Breath sounds: No stridor.  Abdominal:     General: Abdomen is flat. Bowel sounds are normal. There is no distension.     Tenderness: There is abdominal tenderness. There is no right CVA tenderness, left CVA tenderness, guarding or rebound.    Musculoskeletal:        General: No tenderness.     Right lower leg: No edema.     Left lower leg: No edema.  Skin:    General: Skin is warm.     Capillary Refill: Capillary refill takes less than 2 seconds.     Findings: No erythema or rash.  Neurological:     General: No focal deficit present.     Mental Status: She is alert and oriented to person, place, and time.     Motor: No abnormal muscle tone.     Coordination: Coordination normal.     Deep Tendon Reflexes: Reflexes are normal and symmetric.      ED Treatments / Results  Labs (all labs ordered are listed, but only abnormal results are displayed) Labs Reviewed  CBC WITH DIFFERENTIAL/PLATELET - Abnormal; Notable for  the following components:      Result Value   WBC 14.7 (*)    Lymphs Abs 5.3 (*)    Monocytes Absolute 2.0 (*)    All other components within normal limits  COMPREHENSIVE METABOLIC PANEL - Abnormal; Notable for the following components:   Potassium 3.3 (*)    CO2 17 (*)    Glucose, Bld 121 (*)    Creatinine, Ser 1.22 (*)  Total Protein 8.4 (*)    Anion gap 19 (*)    All other components within normal limits  RAPID URINE DRUG SCREEN, HOSP PERFORMED - Abnormal; Notable for the following components:   Tetrahydrocannabinol POSITIVE (*)    All other components within normal limits  URINALYSIS, ROUTINE W REFLEX MICROSCOPIC - Abnormal; Notable for the following components:   APPearance HAZY (*)    Ketones, ur 20 (*)    Leukocytes, UA MODERATE (*)    All other components within normal limits  URINE CULTURE  LIPASE, BLOOD  POC URINE PREG, ED    EKG EKG Interpretation  Date/Time:  Monday December 05 2018 18:41:31 EST Ventricular Rate:  121 PR Interval:    QRS Duration: 81 QT Interval:  339 QTC Calculation: 481 R Axis:   75 Text Interpretation:  Sinus tachycardia Borderline T abnormalities, diffuse leads Borderline prolonged QT interval Baseline wander in lead(s) II III aVR aVF When compared to prior, improved QTc, no STEMI Confirmed by Theda Belfastegeler, Chris [phone]([id number]) on 12/05/2018 6:45:05 PM   Radiology Ct Abdomen Pelvis W Contrast  Result Date: 12/05/2018 CLINICAL DATA:  Acute abdominal pain. EXAM: CT ABDOMEN AND PELVIS WITH CONTRAST TECHNIQUE: Multidetector CT imaging of the abdomen and pelvis was performed using the standard protocol following bolus administration of intravenous contrast. CONTRAST:  100mL ISOVUE-300 IOPAMIDOL (ISOVUE-300) INJECTION 61% COMPARISON:  CT 03/11/2018 FINDINGS: Lower chest: The lung bases are clear. Hepatobiliary: No focal liver abnormality is seen. No gallstones, gallbladder wall thickening, or biliary dilatation. Pancreas: Insert pain Spleen: Normal in size  without focal abnormality. Splenule at the hilum. Adrenals/Urinary Tract: Normal adrenal glands. No hydronephrosis or perinephric edema. Urinary bladder is nondistended, there is diffuse bladder wall thickening. Small amount of fluid anterior to the right bladder dome. Stomach/Bowel: Bowel evaluation is limited in the absence of enteric contrast. Stomach is nondistended. No small bowel obstruction or inflammatory change. Small to moderate colonic stool burden without colonic wall thickening. Vascular/Lymphatic: No significant vascular findings are present. No enlarged abdominal or pelvic lymph nodes. Reproductive: Retroverted uterus. No adnexal mass. Small volume of free fluid in the pelvis. Other: No free air. No abscess. Improved patchy edema in the left gluteal subcutaneous tissues with minimal residual scarring. Small fat containing umbilical hernia. Musculoskeletal: There are no acute or suspicious osseous abnormalities. IMPRESSION: 1. Bladder wall thickening which may be due to nondistention or cystitis. Small volume pelvic free fluid, reactive or physiologic. 2. No other acute findings in the abdomen/pelvis. Electronically Signed   By: Narda RutherfordMelanie  Sanford M.D.   On: 12/05/2018 03:41   [place] Abdomen Limited Ruq  Result Date: 12/05/2018 CLINICAL DATA:  Right upper quadrant pain. EXAM: ULTRASOUND ABDOMEN LIMITED RIGHT UPPER QUADRANT COMPARISON:  CT earlier this day FINDINGS: Gallbladder: Physiologically distended. No gallstones or wall thickening visualized. No sonographic Murphy sign noted by sonographer. Common bile duct: Diameter: 2 mm, normal. Liver: No focal lesion identified. Within normal limits in parenchymal echogenicity. Portal vein is patent on color Doppler imaging with normal direction of blood flow towards the liver. IMPRESSION: Negative right upper quadrant ultrasound. Electronically Signed   By: Narda RutherfordMelanie  Sanford M.D.   On: 12/05/2018 22:08    Procedures Procedures (including critical care  time)  Medications Ordered in ED Medications  capsaicin (ZOSTRIX) 0.025 % cream (has no administration in time range)  sodium chloride 0.9 % bolus 1,000 mL (0 mLs Intravenous Stopped 12/05/18 1943)  ondansetron (ZOFRAN) injection 4 mg (4 mg Intravenous Given 12/05/18 1841)  fentaNYL (SUBLIMAZE) injection 50  mcg (50 mcg Intravenous Given 12/05/18 2031)     Initial Impression / Assessment and Plan / ED Course  I have reviewed the triage vital signs and the nursing notes.  Pertinent labs & imaging results that were available during my care of the patient were reviewed by me and considered in my medical decision making (see chart for details).     Jackie Newman is a 28 y.o. female with a past medical history significant for seizures, asthma, and diagnosis last night of urinary tract infection who presents with continued abdominal discomfort, nausea, vomiting, shaking episodes, and syncopal episodes.  Patient says that for the last 2 months he has been having severe abdominal pain.  She says that she can to the emergency department last night and had a CT scan of her abdomen and urinalysis and blood work and was told that she has a urinary tract infection.  She says that she went home, took her antibiotic, took a nap, then when she woke up she tried to have some soup and a meal he started vomiting.  She reports she then was extremely lightheaded and continued to have abdominal pain.  She presents back to the emergency department for further evaluation.  Patient's denies fevers or chills.  She reports she is not eating or drinking much.  She still says having severe pain all over her abdomen.  She reports is not having significant urinary symptoms or constipation.  She does report she is had some diarrhea as well as nausea and vomiting.  She denied significant chest pain shortness of breath or cough.  She denies any headache, vision changes or any other neurologic deficits.  She reports when she has  severe abdominal pain she gets shaking episodes all over.  When she is having shaking episodes patient does not appear to be having a true seizure.  Patient is able to answer questions and respond to commands.  On exam, patient is shaking but answering questions.  Patient's abdomen was diffusely tender but worse in the upper abdomen.  Patient's lungs were clear and chest was nontender.  Patient was tachycardic on arrival however she has normal oxygen saturations.  Patient was able to move all extremities and normal sensation throughout.  Due to patient's abdominal tenderness in her upper abdomen, will likely order ultrasound as she had a reassuring CT yesterday.  Will have repeat labs and give her nausea and fluids.  Suspect she is dehydrated causing tachycardia.   Anticipate reassessment after medications and fluids.  Patient's work-up was overall reassuring.  Patient's urinalysis shows no nitrites and no bacteria, low suspicion that she has a urinary tract infection at this time.  Patient's other labs were consistent with worsened dehydration.  She was given fluids.  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  Patient was agreeable to trying a GI cocktail and capsaicin cream for her abdomen.  Anticipate discharge home if she is able to eat and drink with prescription for nausea medicine.  Do not feel patient had any seizure activity today, her shaking is likely due to agitation with nausea vomiting abdominal pain.  Patient reported significantly better abdominal pain.  She reports it is much better.  Patient given prescription for nausea medicine and capsaicin as this helped her so much today.  Patient understands to use it sparingly.  Patient will follow-up with PCP and understood return precautions.  Patient discharged in good condition  with improved symptoms.   Final Clinical Impressions(s) / ED  Diagnoses   Final diagnoses:  Abdominal pain  Nausea vomiting and diarrhea    ED Discharge Orders         Ordered    capsaicin (ZOSTRIX) 0.025 % cream  2 times daily     12/06/18 0125    promethazine (PHENERGAN) 25 MG suppository  Every 6 hours PRN     12/06/18 0125         Clinical Impression: 1. Nausea vomiting and diarrhea   2. Abdominal pain     Disposition: Discharge  Condition: Good  I have discussed the results, Dx and Tx plan with the pt(& family if present). He/she/they expressed understanding and agree(s) with the plan. Discharge instructions discussed at great length. Strict return precautions discussed and pt &/or family have verbalized understanding of the instructions. No further questions at time of discharge.    New Prescriptions   CAPSAICIN (ZOSTRIX) 0.025 % CREAM    Apply topically 2 (two) times daily.   PROMETHAZINE (PHENERGAN) 25 MG SUPPOSITORY    Place 1 suppository (25 mg total) rectally every 6 (six) hours as needed for nausea or vomiting.    Follow Up: [facility] [address] [place] [address] [phone] Schedule an appointment as soon as possible for a visit    [facility] [address] [phone] mc [place] [address] [phone]        Tegeler, Canary Brim, MD 12/06/18 [phone]

## 2018-12-05 NOTE — ED Notes (Signed)
Pt hyperventilating and stating that her body feels numb. Pt instructed to take slow deep breaths.

## 2018-12-05 NOTE — ED Provider Notes (Signed)
[facility]-EMERGENCY DEPT Provider Note   CSN: [id number] Arrival date & time: 12/05/18  0005     History   Chief Complaint Chief Complaint  Patient presents with  . Abdominal Pain    HPI Jackie Newman is a 28 y.o. female.  Patient presents to the emergency department with a chief complaint of abdominal pain.  Reports associated nausea, vomiting, and diarrhea.  States the symptoms started today.  She reports having had these symptoms intermittently for the past several months.  She states that no clear explanation has been provided her.  She denies any treatments prior to arrival.  Denies any known sick contacts.  Denies any prior abdominal surgeries.  Denies any dysuria or hematuria.  Denies any new or unusual vaginal discharge or bleeding.  The history is provided by the patient. No language interpreter was used.    Past Medical History:  Diagnosis Date  . Asthma   . Congenital umbilical hernia   . Seizures (HCC)     There are no active problems to display for this patient.   Past Surgical History:  Procedure Laterality Date  . NO PAST SURGERIES       OB History    Gravida  1   Para      Term      Preterm      AB      Living        SAB      TAB      Ectopic      Multiple      Live Births               Home Medications    Prior to Admission medications   Medication Sig Start Date End Date Taking? Authorizing Provider  albuterol (PROVENTIL HFA;VENTOLIN HFA) 108 (90 Base) MCG/ACT inhaler Inhale 1-2 puffs into the lungs every 6 (six) hours as needed for wheezing or shortness of breath.   Yes [provider]  docusate sodium (COLACE) 100 MG capsule Take 1 capsule (100 mg total) by mouth every 12 (twelve) hours. Patient not taking: Reported on 03/29/2018 08/20/15   Ward, Layla MawKristen N, DO  doxycycline (VIBRAMYCIN) 100 MG capsule Take 1 capsule (100 mg total) by mouth 2 (two) times daily. Patient not taking: Reported on  03/29/2018 03/11/18   Elpidio AnisUpstill, Shari, PA-C  HYDROcodone-acetaminophen (NORCO/VICODIN) 5-325 MG tablet Take 1 tablet by mouth every 6 (six) hours as needed for severe pain. Patient not taking: Reported on 03/29/2018 03/12/18   Michela PitcherFawze, Mina A, PA-C  ibuprofen (ADVIL,MOTRIN) 600 MG tablet Take 1 tablet (600 mg total) by mouth every 6 (six) hours as needed. Patient not taking: Reported on 03/29/2018 03/11/18   Elpidio AnisUpstill, Shari, PA-C  meloxicam (MOBIC) 7.5 MG tablet Take 1 tablet (7.5 mg total) by mouth daily. Patient not taking: Reported on 03/29/2018 02/28/18   Caccavale, Sophia, PA-C  naproxen (NAPROSYN) 500 MG tablet Take 1 tablet (500 mg total) by mouth 2 (two) times daily. Patient not taking: Reported on 03/29/2018 10/06/15   Jaynie CrumbleKirichenko, Tatyana, PA-C  ondansetron (ZOFRAN ODT) 4 MG disintegrating tablet Take 1 tablet (4 mg total) by mouth every 8 (eight) hours as needed for nausea or vomiting. Patient not taking: Reported on 12/05/2018 11/01/18   Sabas SousBero, Michael M, MD    Family History Family History  Problem Relation Age of Onset  . Asthma Father   . Gout Father     Social History Social History   Tobacco Use  .  Smoking status: Former Smoker    Packs/day: 5.00    Types: Cigars  . Smokeless tobacco: Never Used  Substance Use Topics  . Alcohol use: No    Comment: occasional socially  . Drug use: Yes    Frequency: 7.0 times per week    Types: Marijuana     Allergies   Doxycycline   Review of Systems Review of Systems  All other systems reviewed and are negative.    Physical Exam Updated Vital Signs BP 120/74 (BP Location: Left Arm)   Pulse (!) 109   Temp 98.9 F (37.2 C) (Oral)   Resp 20   Ht 5\' 3"  (1.6 m)   Wt 86.2 kg   LMP 11/20/2018 (Exact Date)   SpO2 100%   BMI 33.66 kg/m   Physical Exam Vitals signs and nursing note reviewed.  Constitutional:      Appearance: She is well-developed.  HENT:     Head: Normocephalic and atraumatic.  Eyes:     Conjunctiva/sclera:  Conjunctivae normal.     Pupils: Pupils are equal, round, and reactive to light.  Neck:     Musculoskeletal: Normal range of motion and neck supple.  Cardiovascular:     Rate and Rhythm: Normal rate and regular rhythm.     Heart sounds: No murmur. No friction rub. No gallop.   Pulmonary:     Effort: Pulmonary effort is normal. No respiratory distress.     Breath sounds: Normal breath sounds. No wheezing or rales.  Chest:     Chest wall: No tenderness.  Abdominal:     General: Bowel sounds are normal. There is no distension.     Palpations: Abdomen is soft. There is no mass.     Tenderness: There is no abdominal tenderness. There is no guarding or rebound.     Comments: Generalized abdominal discomfort, without focal tenderness  Musculoskeletal: Normal range of motion.        General: No tenderness.  Skin:    General: Skin is warm and dry.  Neurological:     Mental Status: She is alert and oriented to person, place, and time.  Psychiatric:        Behavior: Behavior normal.        Thought Content: Thought content normal.        Judgment: Judgment normal.      ED Treatments / Results  Labs (all labs ordered are listed, but only abnormal results are displayed) Labs Reviewed  COMPREHENSIVE METABOLIC PANEL - Abnormal; Notable for the following components:      Result Value   CO2 18 (*)    Glucose, Bld 133 (*)    Total Protein 8.2 (*)    All other components within normal limits  CBC - Abnormal; Notable for the following components:   WBC 11.2 (*)    All other components within normal limits  URINALYSIS, ROUTINE W REFLEX MICROSCOPIC - Abnormal; Notable for the following components:   Ketones, ur 20 (*)    Leukocytes, UA SMALL (*)    Bacteria, UA RARE (*)    All other components within normal limits  LIPASE, BLOOD  I-STAT BETA HCG BLOOD, ED ([facility], [facility], [facility] ONLY)    EKG None  Radiology Ct Abdomen Pelvis W Contrast  Result Date: 12/05/2018 CLINICAL DATA:  Acute abdominal  pain. EXAM: CT ABDOMEN AND PELVIS WITH CONTRAST TECHNIQUE: Multidetector CT imaging of the abdomen and pelvis was performed using the standard protocol following bolus administration of intravenous contrast.  CONTRAST:  100mL ISOVUE-300 IOPAMIDOL (ISOVUE-300) INJECTION 61% COMPARISON:  CT 03/11/2018 FINDINGS: Lower chest: The lung bases are clear. Hepatobiliary: No focal liver abnormality is seen. No gallstones, gallbladder wall thickening, or biliary dilatation. Pancreas: Insert pain Spleen: Normal in size without focal abnormality. Splenule at the hilum. Adrenals/Urinary Tract: Normal adrenal glands. No hydronephrosis or perinephric edema. Urinary bladder is nondistended, there is diffuse bladder wall thickening. Small amount of fluid anterior to the right bladder dome. Stomach/Bowel: Bowel evaluation is limited in the absence of enteric contrast. Stomach is nondistended. No small bowel obstruction or inflammatory change. Small to moderate colonic stool burden without colonic wall thickening. Vascular/Lymphatic: No significant vascular findings are present. No enlarged abdominal or pelvic lymph nodes. Reproductive: Retroverted uterus. No adnexal mass. Small volume of free fluid in the pelvis. Other: No free air. No abscess. Improved patchy edema in the left gluteal subcutaneous tissues with minimal residual scarring. Small fat containing umbilical hernia. Musculoskeletal: There are no acute or suspicious osseous abnormalities. IMPRESSION: 1. Bladder wall thickening which may be due to nondistention or cystitis. Small volume pelvic free fluid, reactive or physiologic. 2. No other acute findings in the abdomen/pelvis. Electronically Signed   By: Narda RutherfordMelanie  Sanford M.D.   On: 12/05/2018 03:41    Procedures Procedures (including critical care time)  Medications Ordered in ED Medications  iopamidol (ISOVUE-300) 61 % injection (has no administration in time range)  sodium chloride (PF) 0.9 % injection (has no  administration in time range)  cefTRIAXone (ROCEPHIN) 1 g in sodium chloride 0.9 % 100 mL IVPB (1 g Intravenous New Bag/Given 12/05/18 0402)  ketorolac (TORADOL) 30 MG/ML injection 30 mg (has no administration in time range)  ondansetron (ZOFRAN) injection 4 mg (has no administration in time range)  HYDROmorphone (DILAUDID) injection 1 mg (1 mg Intravenous Given 12/05/18 0155)  ondansetron (ZOFRAN) injection 4 mg (4 mg Intravenous Given 12/05/18 0155)  iopamidol (ISOVUE-300) 61 % injection 100 mL (100 mLs Intravenous Contrast Given 12/05/18 0252)     Initial Impression / Assessment and Plan / ED Course  I have reviewed the triage vital signs and the nursing notes.  Pertinent labs & imaging results that were available during my care of the patient were reviewed by me and considered in my medical decision making (see chart for details).    Patient with generalized abdominal pain.  Symptoms started yesterday.  Associated nausea and vomiting.  Does have some suprapubic discomfort.  Urinalysis is somewhat concerning for UTI.  However, she does have lower abdominal tenderness, will check CT.  CT remarkable for bladder wall thickening, which may indicate cystitis.  Will treat with Rocephin.  Recommend PCP follow-up.  Will discharge home with Keflex.  Return precautions given.   Final Clinical Impressions(s) / ED Diagnoses   Final diagnoses:  Cystitis    ED Discharge Orders         Ordered    cephALEXin (KEFLEX) 500 MG capsule  2 times daily     12/05/18 0411    ondansetron (ZOFRAN ODT) 4 MG disintegrating tablet  Every 8 hours PRN     12/05/18 0411    HYDROcodone-acetaminophen (NORCO/VICODIN) 5-325 MG tablet  Every 6 hours PRN     12/05/18 0411           Roxy HorsemanBrowning, Edelmiro Innocent, PA-C 12/05/18 0413    Devoria AlbeKnapp, Iva, MD 12/05/18 [phone]

## 2018-12-05 NOTE — ED Notes (Signed)
Pt ambulated to BR with minimal assist. Pt states she's weak feeling. Denies dizziness.

## 2018-12-05 NOTE — ED Triage Notes (Signed)
Pt c/o abd pain with n/v/d since November.  Last episode started today @ 2100.

## 2018-12-05 NOTE — ED Notes (Signed)
Pt advised multiple times to call for a ride, however pt insisted on driving herself home. PA made aware and agreeable to discharge pt to self care at this time. Pt ambulating self to bathroom and lobby steadily at time of discharge.

## 2018-12-06 MED ORDER — PROMETHAZINE HCL 25 MG RE SUPP: 25.0000 mg | Freq: Four times a day (QID) | RECTAL | 0 refills | Status: DC | PRN

## 2018-12-06 MED ORDER — CAPSAICIN 0.025 % EX CREA: TOPICAL_CREAM | Freq: Two times a day (BID) | CUTANEOUS | 0 refills | Status: DC

## 2018-12-06 NOTE — Discharge Instructions (Signed)
Your work-up today was overall reassuring.  I do not think you have a urinary tract infection based on your lack of symptoms and your urinalysis results.  As the capsaicin cream helped her abdominal pain significantly, please utilize this sparingly at home.  Please use the nausea medicine we discussed.  Please follow-up with your PCP for further ongoing management of your chronic abdominal pain nausea and vomiting.  If any symptoms change or worsen, please return to the nearest emergency department.

## 2018-12-07 LAB — URINE CULTURE: Culture: NO GROWTH

## 2018-12-20 DIAGNOSIS — Z87891 Personal history of nicotine dependence: Secondary | ICD-10-CM | POA: Insufficient documentation

## 2018-12-20 DIAGNOSIS — R112 Nausea with vomiting, unspecified: Secondary | ICD-10-CM | POA: Insufficient documentation

## 2018-12-20 DIAGNOSIS — R197 Diarrhea, unspecified: Secondary | ICD-10-CM | POA: Insufficient documentation

## 2018-12-20 DIAGNOSIS — J45909 Unspecified asthma, uncomplicated: Secondary | ICD-10-CM | POA: Insufficient documentation

## 2018-12-21 ENCOUNTER — Other Ambulatory Visit: Payer: Self-pay

## 2018-12-21 ENCOUNTER — Emergency Department (HOSPITAL_COMMUNITY)
Admission: EM | Admit: 2018-12-21 | Discharge: 2018-12-21 | Disposition: A | Discharge status: Home / Self Care | Payer: BLUE CROSS/BLUE SHIELD | Attending: Emergency Medicine | Admitting: Emergency Medicine

## 2018-12-21 ENCOUNTER — Encounter (HOSPITAL_COMMUNITY): Payer: Self-pay | Admitting: *Deleted

## 2018-12-21 DIAGNOSIS — R112 Nausea with vomiting, unspecified: Secondary | ICD-10-CM

## 2018-12-21 DIAGNOSIS — R197 Diarrhea, unspecified: Secondary | ICD-10-CM

## 2018-12-21 LAB — COMPREHENSIVE METABOLIC PANEL
ALT: 33 U/L (ref 0–44)
AST: 25 U/L (ref 15–41)
Albumin: 4.5 g/dL (ref 3.5–5.0)
Alkaline Phosphatase: 41 U/L (ref 38–126)
Anion gap: 8 (ref 5–15)
BUN: 7 mg/dL (ref 6–20)
CO2: 23 mmol/L (ref 22–32)
Calcium: 9 mg/dL (ref 8.9–10.3)
Chloride: 107 mmol/L (ref 98–111)
Creatinine, Ser: 0.85 mg/dL (ref 0.44–1.00)
GFR calc non Af Amer: >60 mL/min (ref >60)
Glucose, Bld: 110 mg/dL — ABNORMAL HIGH (ref 70–99)
Potassium: 3.6 mmol/L (ref 3.5–5.1)
SODIUM: 138 mmol/L (ref 135–145)
Total Bilirubin: 0.5 mg/dL (ref 0.3–1.2)
Total Protein: 7.7 g/dL (ref 6.5–8.1)

## 2018-12-21 LAB — CBC
HCT: 40.4 % (ref 36.0–46.0)
Hemoglobin: 13.3 g/dL (ref 12.0–15.0)
MCH: 31 pg (ref 26.0–34.0)
MCHC: 32.9 g/dL (ref 30.0–36.0)
MCV: 94.2 fL (ref 80.0–100.0)
Platelets: 300 10*3/uL (ref 150–400)
RBC: 4.29 MIL/uL (ref 3.87–5.11)
RDW: 12.7 % (ref 11.5–15.5)
WBC: 8.3 10*3/uL (ref 4.0–10.5)
nRBC: 0 % (ref 0.0–0.2)

## 2018-12-21 LAB — URINALYSIS, ROUTINE W REFLEX MICROSCOPIC
BILIRUBIN URINE: NEGATIVE
Glucose, UA: NEGATIVE mg/dL
Ketones, ur: NEGATIVE mg/dL
Nitrite: NEGATIVE
PH: 6 (ref 5.0–8.0)
Protein, ur: NEGATIVE mg/dL
Specific Gravity, Urine: 1.004 — ABNORMAL LOW (ref 1.005–1.030)

## 2018-12-21 LAB — RAPID URINE DRUG SCREEN, HOSP PERFORMED
Amphetamines: NOT DETECTED
Barbiturates: NOT DETECTED
Benzodiazepines: NOT DETECTED
Cocaine: NOT DETECTED
OPIATES: NOT DETECTED
TETRAHYDROCANNABINOL: POSITIVE — AB

## 2018-12-21 LAB — PREGNANCY, URINE: PREG TEST UR: NEGATIVE

## 2018-12-21 LAB — LIPASE, BLOOD: Lipase: 30 U/L (ref 11–51)

## 2018-12-21 MED ORDER — CAPSAICIN 0.025 % EX CREA
TOPICAL_CREAM | Freq: Once | CUTANEOUS | Status: DC
  Filled 2018-12-21: qty 60

## 2018-12-21 MED ORDER — SODIUM CHLORIDE 0.9% FLUSH: 3.0000 mL | Freq: Once | INTRAVENOUS | Status: DC

## 2018-12-21 MED ORDER — SODIUM CHLORIDE 0.9 % IV BOLUS
1000.0000 mL | Freq: Once | INTRAVENOUS | Status: AC
  Administered 2018-12-21: 1000 mL via INTRAVENOUS

## 2018-12-21 MED ORDER — DICYCLOMINE HCL 20 MG PO TABS: 20.0000 mg | ORAL_TABLET | Freq: Two times a day (BID) | ORAL | 0 refills | Status: DC

## 2018-12-21 MED ORDER — METOCLOPRAMIDE HCL 5 MG/ML IJ SOLN
10.0000 mg | Freq: Once | INTRAMUSCULAR | Status: AC
  Administered 2018-12-21: 10 mg via INTRAVENOUS
  Filled 2018-12-21: qty 2

## 2018-12-21 MED ORDER — ONDANSETRON 4 MG PO TBDP: 4.0000 mg | ORAL_TABLET | Freq: Three times a day (TID) | ORAL | 0 refills | Status: DC | PRN

## 2018-12-21 NOTE — ED Triage Notes (Addendum)
Pt c/o n/v/d x 3 months, hasn't stopped.

## 2018-12-21 NOTE — ED Provider Notes (Signed)
[facility]-EMERGENCY DEPT Provider Note   CSN: [id number] Arrival date & time: 12/20/18  2339     History   Chief Complaint Chief Complaint  Patient presents with  . Emesis    HPI Jackie Newman is a 28 y.o. female.  The history is provided by the patient and medical records.  Emesis  Associated symptoms: diarrhea      28 year old female with history of asthma, seizures, presenting to the ED with nausea and vomiting for the past 3 months.  She has been seen in the ED for this several times in the past and told it was likely related to marijuana abuse.  She reports she has not had any marijuana since 12/04/2018, however symptoms have not improved.  She reports epigastric abdominal pain and continued nausea and vomiting.  Has had some loose stools as well.  No blood in emesis or stool.  She has not had any noted fevers.  No sick contacts.  She has been using Phenergan at home without much relief.  No prior abdominal surgeries.  Past Medical History:  Diagnosis Date  . Asthma   . Congenital umbilical hernia   . Seizures (HCC)     There are no active problems to display for this patient.   Past Surgical History:  Procedure Laterality Date  . NO PAST SURGERIES       OB History    Gravida  1   Para      Term      Preterm      AB      Living        SAB      TAB      Ectopic      Multiple      Live Births               Home Medications    Prior to Admission medications   Medication Sig Start Date End Date Taking? Authorizing Provider  albuterol (PROVENTIL HFA;VENTOLIN HFA) 108 (90 Base) MCG/ACT inhaler Inhale 1-2 puffs into the lungs every 6 (six) hours as needed for wheezing or shortness of breath.   Yes [provider]  promethazine (PHENERGAN) 25 MG suppository Place 1 suppository (25 mg total) rectally every 6 (six) hours as needed for nausea or vomiting. 12/06/18  Yes Tegeler, Canary Brim, MD  capsaicin (ZOSTRIX)  0.025 % cream Apply topically 2 (two) times daily. Patient not taking: Reported on 12/21/2018 12/06/18   Tegeler, Canary Brim, MD  cephALEXin (KEFLEX) 500 MG capsule Take 1 capsule (500 mg total) by mouth 2 (two) times daily. Patient not taking: Reported on 12/21/2018 12/05/18   Roxy Horseman, PA-C  HYDROcodone-acetaminophen (NORCO/VICODIN) 5-325 MG tablet Take 1-2 tablets by mouth every 6 (six) hours as needed. Patient not taking: Reported on 12/21/2018 12/05/18   Roxy Horseman, PA-C  meloxicam (MOBIC) 7.5 MG tablet Take 1 tablet (7.5 mg total) by mouth daily. Patient not taking: Reported on 03/29/2018 02/28/18   Caccavale, Sophia, PA-C  naproxen (NAPROSYN) 500 MG tablet Take 1 tablet (500 mg total) by mouth 2 (two) times daily. Patient not taking: Reported on 03/29/2018 10/06/15   Jaynie Crumble, PA-C  ondansetron (ZOFRAN ODT) 4 MG disintegrating tablet Take 1 tablet (4 mg total) by mouth every 8 (eight) hours as needed. Patient not taking: Reported on 12/21/2018 12/05/18   Roxy Horseman, PA-C    Family History Family History  Problem Relation Age of Onset  . Asthma Father   .  Gout Father     Social History Social History   Tobacco Use  . Smoking status: Former Smoker    Packs/day: 5.00    Types: Cigars  . Smokeless tobacco: Never Used  Substance Use Topics  . Alcohol use: No    Comment: occasional socially  . Drug use: Yes    Frequency: 7.0 times per week    Types: Marijuana     Allergies   Doxycycline   Review of Systems Review of Systems  Gastrointestinal: Positive for diarrhea, nausea and vomiting.  All other systems reviewed and are negative.    Physical Exam Updated Vital Signs BP 119/73   Pulse 82   Temp 98.4 F (36.9 C) (Oral)   Resp 18   Ht 5\' 3"  (1.6 m)   Wt 81.6 kg   LMP 12/18/2018 (Exact Date)   SpO2 100%   BMI 31.89 kg/m   Physical Exam Vitals signs and nursing note reviewed.  Constitutional:      Appearance: She is well-developed.    HENT:     Head: Normocephalic and atraumatic.  Eyes:     Conjunctiva/sclera: Conjunctivae normal.     Pupils: Pupils are equal, round, and reactive to light.  Neck:     Musculoskeletal: Normal range of motion.  Cardiovascular:     Rate and Rhythm: Normal rate and regular rhythm.     Heart sounds: Normal heart sounds.  Pulmonary:     Effort: Pulmonary effort is normal.     Breath sounds: Normal breath sounds.  Abdominal:     General: Bowel sounds are normal.     Palpations: Abdomen is soft.     Tenderness: There is no abdominal tenderness. There is no guarding or rebound.     Comments: Abdomen soft, overall non-tender, no peritoneal signs  Musculoskeletal: Normal range of motion.  Skin:    General: Skin is warm and dry.  Neurological:     Mental Status: She is alert and oriented to person, place, and time.      ED Treatments / Results  Labs (all labs ordered are listed, but only abnormal results are displayed) Labs Reviewed  COMPREHENSIVE METABOLIC PANEL - Abnormal; Notable for the following components:      Result Value   Glucose, Bld 110 (*)    All other components within normal limits  URINALYSIS, ROUTINE W REFLEX MICROSCOPIC - Abnormal; Notable for the following components:   Color, Urine STRAW (*)    Specific Gravity, Urine 1.004 (*)    Hgb urine dipstick SMALL (*)    Leukocytes, UA SMALL (*)    Bacteria, UA RARE (*)    All other components within normal limits  RAPID URINE DRUG SCREEN, HOSP PERFORMED - Abnormal; Notable for the following components:   Tetrahydrocannabinol POSITIVE (*)    All other components within normal limits  LIPASE, BLOOD  CBC  PREGNANCY, URINE    EKG None  Radiology No results found.  Procedures Procedures (including critical care time)  Medications Ordered in ED Medications  sodium chloride flush (NS) 0.9 % injection 3 mL (has no administration in time range)  capsaicin (ZOSTRIX) 0.025 % cream ( Topical Not Given 12/21/18  0220)  sodium chloride 0.9 % bolus 1,000 mL (1,000 mLs Intravenous New Bag/Given 12/21/18 0158)  metoCLOPramide (REGLAN) injection 10 mg (10 mg Intravenous Given 12/21/18 0154)     Initial Impression / Assessment and Plan / ED Course  I have reviewed the triage vital signs and the nursing notes.  Pertinent labs & imaging results that were available during my care of the patient were reviewed by me and considered in my medical decision making (see chart for details).  28 year old female here with nausea, vomiting, and diarrhea.  Has been seen for this in the past and told it was likely related to marijuana use.  She denies smoking since 12/04/2018.  Last ED visit 12/05/2018 and UDS remained positive at that time.  She did have CT scan and ultrasound at that visit which were overall unremarkable.  Here she is afebrile and nontoxic in appearance.  Exam is overall assuring.  Plan for screening labs including UDS and symptomatic management.  3:15 AM Patient reassessed, has been sleeping for the past hour.  No emesis here in the ED.  Vitals stable.  Labs reassuring.  She denies marijuana usage, however UDS + for THC.  She remains concerned about prolonged symptoms.  Will refer to GI, however suspect ongoing marijuana use is true etiology of her symptoms.  Rx bentyl, zofran.  Return here for any new/acute changes.    Final Clinical Impressions(s) / ED Diagnoses   Final diagnoses:  Nausea vomiting and diarrhea    ED Discharge Orders         Ordered    ondansetron (ZOFRAN ODT) 4 MG disintegrating tablet  Every 8 hours PRN     12/21/18 0317    dicyclomine (BENTYL) 20 MG tablet  2 times daily     12/21/18 0317           Garlon Hatchet, PA-C 12/21/18 1103    Derwood Kaplan, MD 12/21/18 [phone]

## 2018-12-21 NOTE — Discharge Instructions (Signed)
Take the prescribed medication as directed. Follow-up with GI--can call for appt. Return to the ED for new or worsening symptoms.

## 2019-02-01 ENCOUNTER — Encounter: Payer: Self-pay | Admitting: Internal Medicine

## 2019-02-21 ENCOUNTER — Telehealth: Payer: Self-pay

## 2019-02-21 NOTE — Telephone Encounter (Signed)
Pt has been seen multiple times in the ER for abdominal pain and nausea. States she has been having issues off and on since November and was told to see a GI doctor. Pt states she prefers to come in to be seen.   Covid-19 travel screening questions  Have you traveled in the last 14 days? No If yes where?  Do you now or have you had a fever in the last 14 days? No  Do you have any respiratory symptoms of shortness of breath or cough now or in the last 14 days? No  Do you have a medical history of Congestive Heart Failure? N/a  Do you have a medical history of lung disease? N/a  Do you have any family members or close contacts with diagnosed or suspected Covid-19? No

## 2019-02-22 ENCOUNTER — Ambulatory Visit: Payer: BLUE CROSS/BLUE SHIELD | Admitting: Internal Medicine

## 2019-02-22 ENCOUNTER — Encounter (INDEPENDENT_AMBULATORY_CARE_PROVIDER_SITE_OTHER): Payer: Self-pay

## 2019-02-22 ENCOUNTER — Other Ambulatory Visit: Payer: Self-pay

## 2019-09-24 IMAGING — US US ABDOMEN LIMITED
1 series · 14 of 25 positions shown · non-contrast
Comparison: CT earlier this day

CLINICAL DATA: Right upper quadrant pain.

EXAM:
ULTRASOUND ABDOMEN LIMITED RIGHT UPPER QUADRANT

[Series 1: us abdomen limited · 0.17mm/px · 14 of 38 slices shown]
[im 1/38]
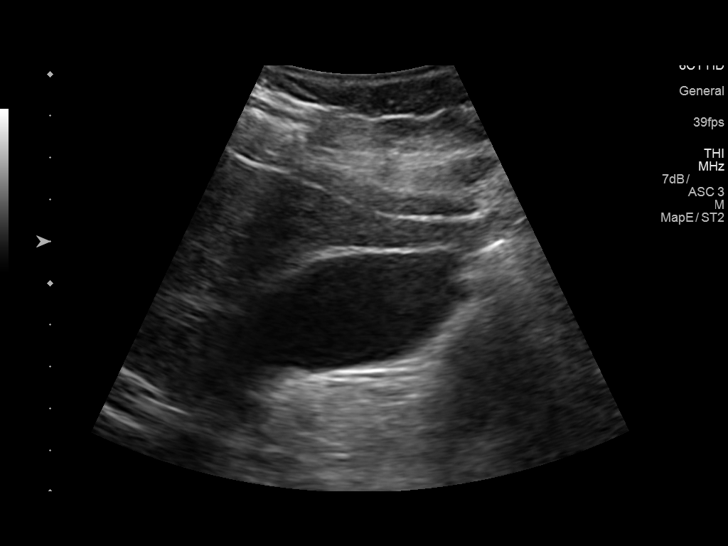
[im 4/38]
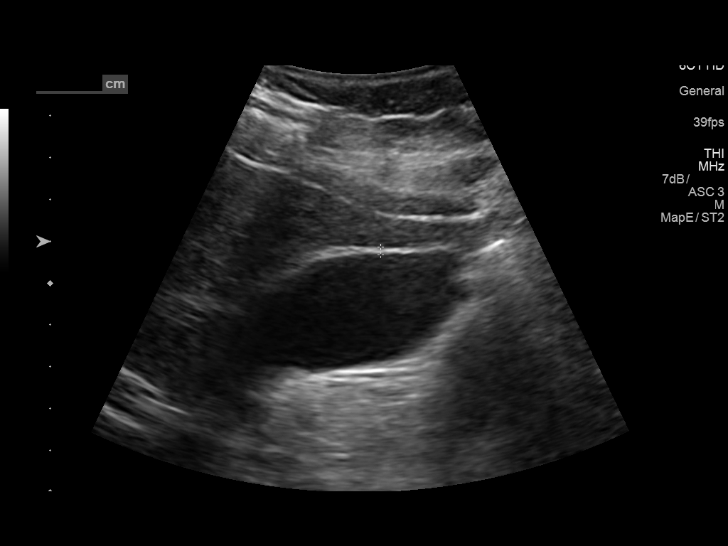
[im 7/38]
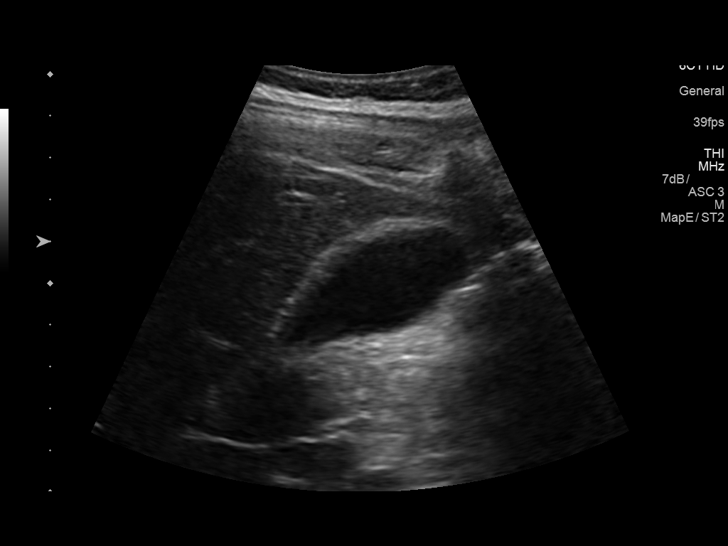
[im 10/38]
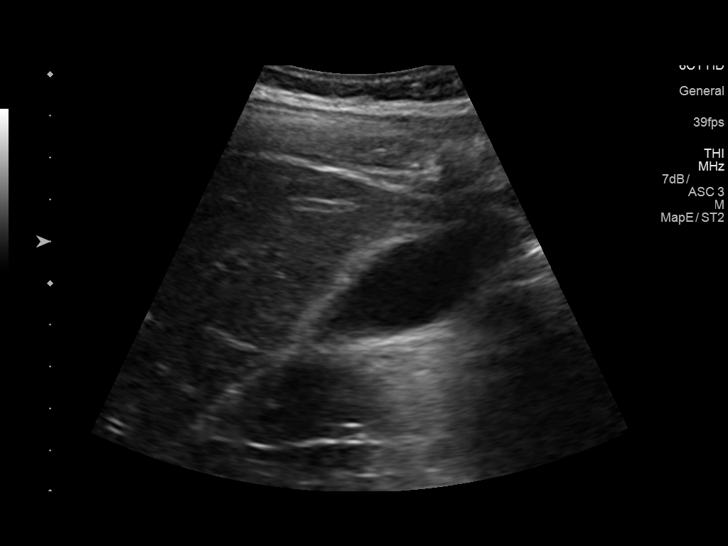
[im 13/38]
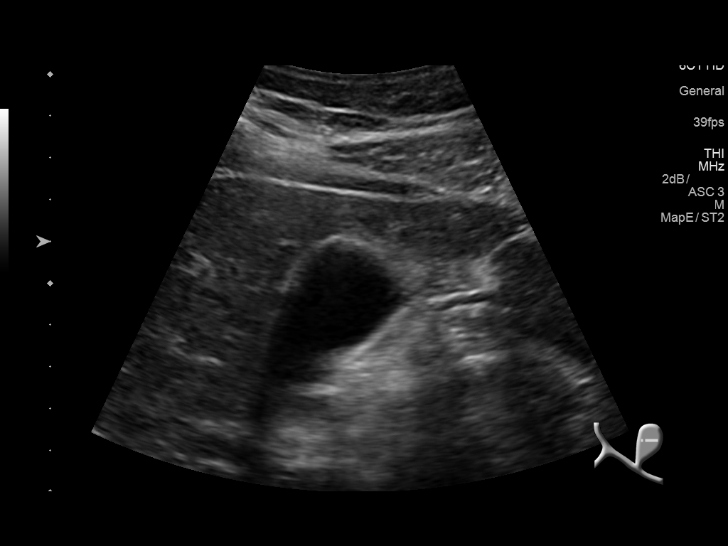
[im 14/38]
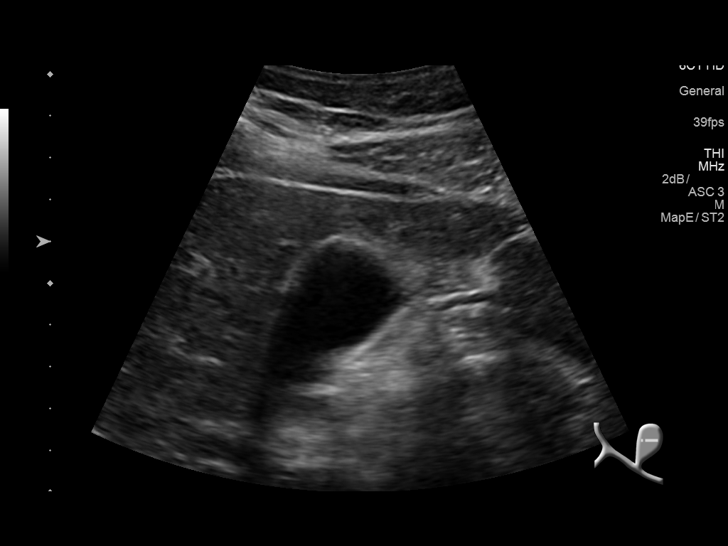
[im 17/38]
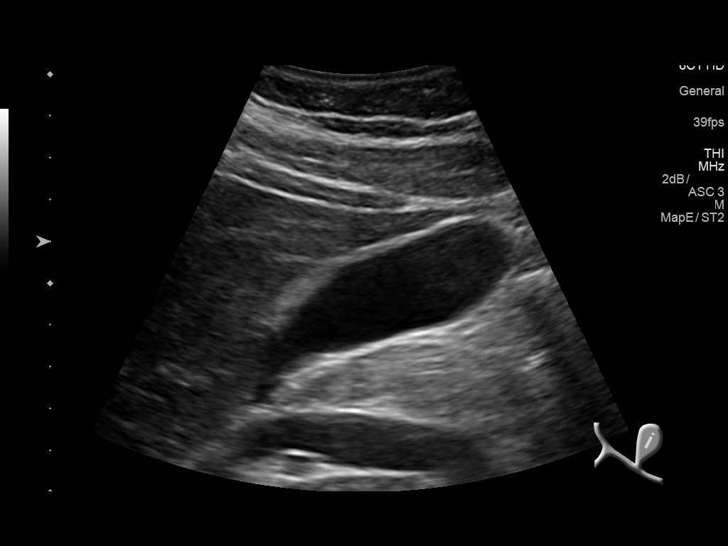
[im 21/38]
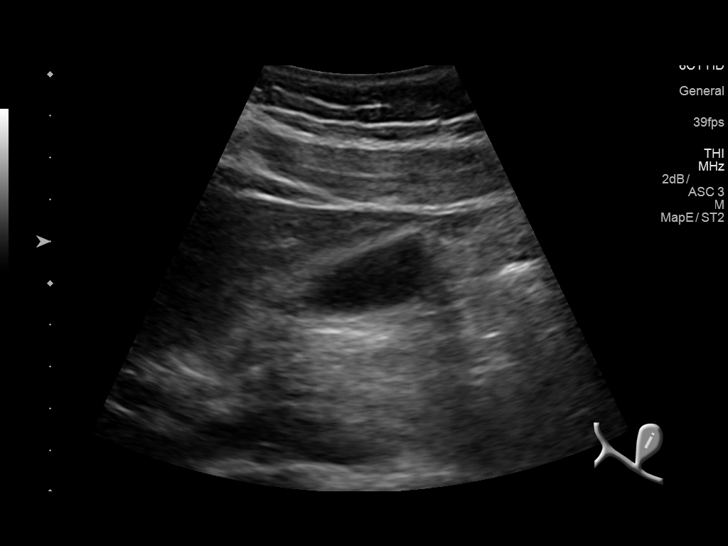
[im 24/38]
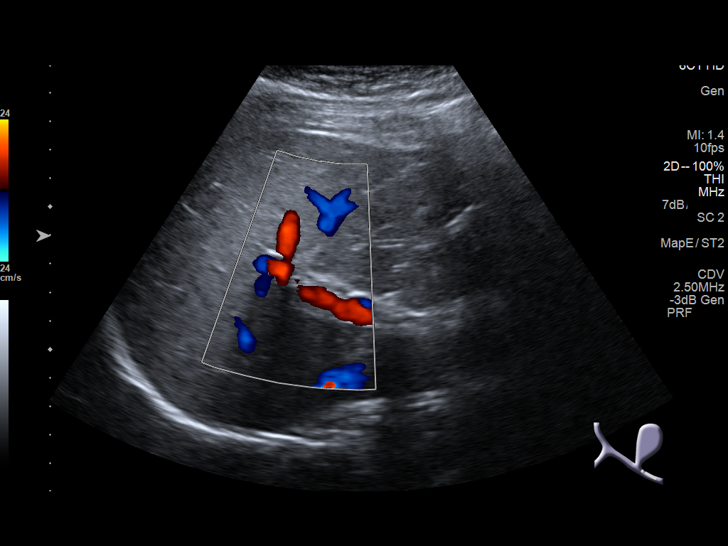
[im 25/38]
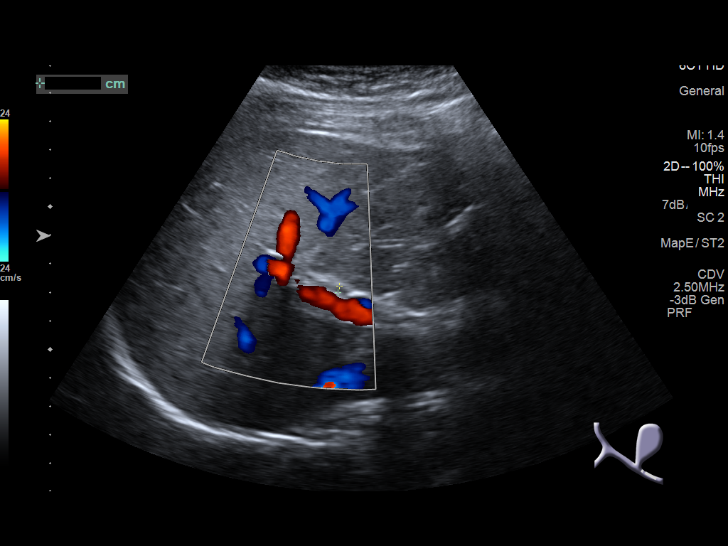
[im 28/38]
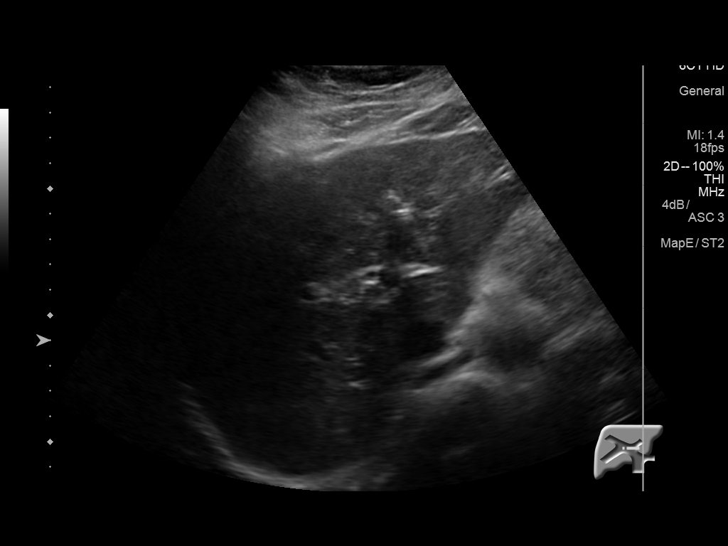
[im 31/38]
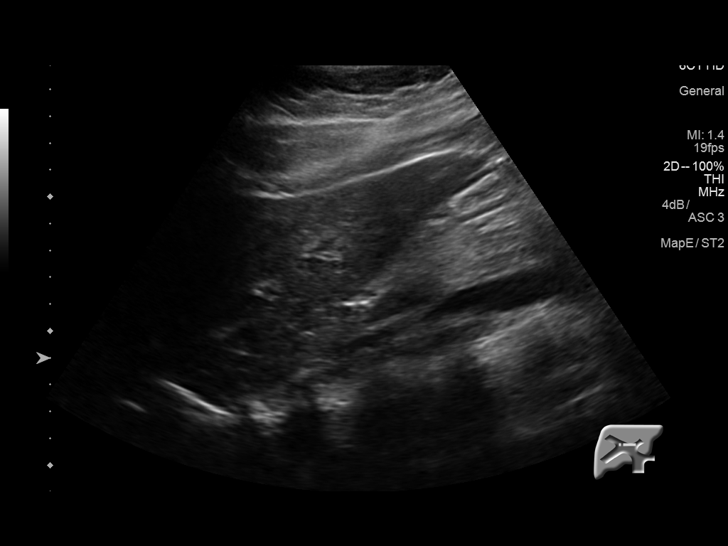
[im 34/38]
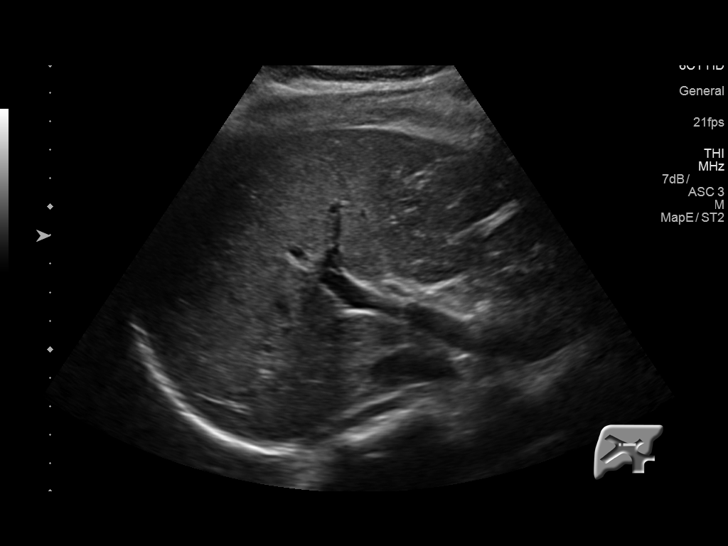
[im 38/38]
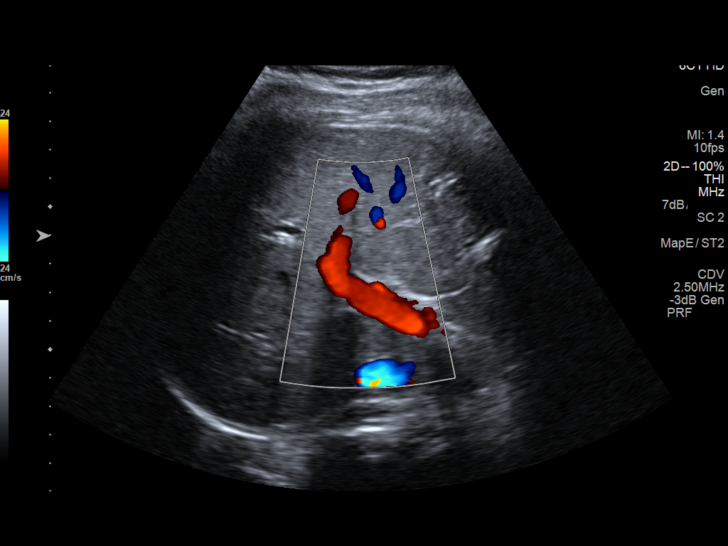

[14 of 25 positions shown; findings below may reference images not displayed]

FINDINGS: Gallbladder:

Physiologically distended. No gallstones or wall thickening
visualized. No sonographic Murphy sign noted by sonographer.

Common bile duct:

Diameter: 2 mm, normal.

Liver:

No focal lesion identified. Within normal limits in parenchymal
echogenicity. Portal vein is patent on color Doppler imaging with
normal direction of blood flow towards the liver.
IMPRESSION: Negative right upper quadrant ultrasound.

## 2019-10-16 ENCOUNTER — Emergency Department (HOSPITAL_COMMUNITY)
Admission: EM | Admit: 2019-10-16 | Discharge: 2019-10-17 | Disposition: A | Discharge status: Home / Self Care | Payer: BLUE CROSS/BLUE SHIELD | Attending: Emergency Medicine | Admitting: Emergency Medicine

## 2019-10-16 ENCOUNTER — Other Ambulatory Visit: Payer: Self-pay

## 2019-10-16 ENCOUNTER — Encounter (HOSPITAL_COMMUNITY): Payer: Self-pay | Admitting: Emergency Medicine

## 2019-10-16 DIAGNOSIS — R112 Nausea with vomiting, unspecified: Secondary | ICD-10-CM | POA: Diagnosis not present

## 2019-10-16 DIAGNOSIS — Z87891 Personal history of nicotine dependence: Secondary | ICD-10-CM | POA: Insufficient documentation

## 2019-10-16 DIAGNOSIS — R419 Unspecified symptoms and signs involving cognitive functions and awareness: Secondary | ICD-10-CM | POA: Diagnosis not present

## 2019-10-16 DIAGNOSIS — R197 Diarrhea, unspecified: Secondary | ICD-10-CM | POA: Diagnosis not present

## 2019-10-16 DIAGNOSIS — R200 Anesthesia of skin: Secondary | ICD-10-CM | POA: Diagnosis not present

## 2019-10-16 DIAGNOSIS — R1012 Left upper quadrant pain: Secondary | ICD-10-CM | POA: Insufficient documentation

## 2019-10-16 DIAGNOSIS — R509 Fever, unspecified: Secondary | ICD-10-CM | POA: Diagnosis not present

## 2019-10-16 DIAGNOSIS — R000 Tachycardia, unspecified: Secondary | ICD-10-CM | POA: Diagnosis not present

## 2019-10-16 DIAGNOSIS — R251 Tremor, unspecified: Secondary | ICD-10-CM | POA: Diagnosis not present

## 2019-10-16 DIAGNOSIS — R202 Paresthesia of skin: Secondary | ICD-10-CM | POA: Insufficient documentation

## 2019-10-16 DIAGNOSIS — J45909 Unspecified asthma, uncomplicated: Secondary | ICD-10-CM | POA: Insufficient documentation

## 2019-10-16 MED ORDER — MORPHINE SULFATE (PF) 4 MG/ML IV SOLN
4.0000 mg | Freq: Once | INTRAVENOUS | Status: AC
  Administered 2019-10-17: 01:00:00 4 mg via INTRAVENOUS
  Filled 2019-10-16: qty 1

## 2019-10-16 MED ORDER — ONDANSETRON HCL 4 MG/2ML IJ SOLN
4.0000 mg | Freq: Once | INTRAMUSCULAR | Status: AC
  Administered 2019-10-17: 4 mg via INTRAVENOUS
  Filled 2019-10-16: qty 2

## 2019-10-16 NOTE — ED Provider Notes (Signed)
[facility]-EMERGENCY DEPT Provider Note   CSN: [id number] Arrival date & time: 10/16/19  1924     History   Chief Complaint Chief Complaint  Patient presents with   Hematemesis   Seizures    HPI Jackie StaggersShantell Newman is a 28 y.o. female with history of asthma who presents to the emergency department with a chief complaint of abdominal pain.  The patient reports that she has been having intermittent episodes of diffuse, cramping abdominal pain accompanied by nausea, vomiting, and diarrhea for the last year.  She reports subjective fever and chills.  She reports that she has been evaluated multiple times in the ER for previous episodes of pain.   Pain has been constant since onset today, but is waxing and waning in intensity.  She [facility] and underwent an EGD. States they found a "lump" that they are biopsying?  She is scheduled for an EGD on January 4.  She reports that with the episode she will have 8-9 watery episodes of diarrhea daily.  She reports that she has had multiple episodes of vomiting today since the current episode of pain began.  In the past, she has had hematemesis with these episodes, but denies any hematemesis for several months. No melena, hematochezia.  She reports that her weight has been "up and down" over the last year.   She denies dysuria, hematuria, vaginal pain, bleeding, or discharge, constipation, back pain, cough, shortness of breath.  She states that she will also get full body shaking with these episodes and her mom previously told her that she may be having a seizure.  She reports that her eyes will be open and she is able to talk during these episodes.  Nursing staff observed her having an episode in the ER tonight and she was awake and talking during the episode.  When I was examining her, she began having a similar episode and she appeared to be having a rigors.  She does report  that she does not take any antiseizure medications and has never been evaluated by a neurologist as she recently obtained medical insurance.  She also reports as if her bilateral legs are numb and have been for the last few hours.  No numbness in her bilateral upper extremities or to the face.  She reports that this frequently happens with episodes of her pain.  She endorses marijuana use.      The history is provided by the patient. No language interpreter was used.    Past Medical History:  Diagnosis Date   Asthma    Congenital umbilical hernia    Seizures (HCC)     There are no active problems to display for this patient.   Past Surgical History:  Procedure Laterality Date   NO PAST SURGERIES       OB History    Gravida  1   Para      Term      Preterm      AB      Living        SAB      TAB      Ectopic      Multiple      Live Births               Home Medications    Prior to Admission medications   Medication Sig Start Date End Date Taking? Authorizing Provider  albuterol (PROVENTIL HFA;VENTOLIN HFA) 108 (  90 Base) MCG/ACT inhaler Inhale 1-2 puffs into the lungs every 6 (six) hours as needed for wheezing or shortness of breath.   Yes [provider]  dicyclomine (BENTYL) 20 MG tablet Take 1 tablet (20 mg total) by mouth 2 (two) times daily. 10/17/19   Akshita Italiano A, PA-C  promethazine (PHENERGAN) 25 MG suppository Place 1 suppository (25 mg total) rectally every 6 (six) hours as needed for nausea or vomiting. 10/17/19   Icey Tello, Coral Else, PA-C    Family History Family History  Problem Relation Age of Onset   Asthma Father    Gout Father     Social History Social History   Tobacco Use   Smoking status: Former Smoker    Packs/day: 5.00    Types: Cigars   Smokeless tobacco: Never Used  Substance Use Topics   Alcohol use: No    Comment: occasional socially   Drug use: Yes    Frequency: 7.0 times per week    Types:  Marijuana     Allergies   Doxycycline   Review of Systems Review of Systems  Constitutional: Positive for chills and fever. Negative for activity change and unexpected weight change.  HENT: Negative for congestion and sore throat.   Respiratory: Negative for cough, shortness of breath and wheezing.   Cardiovascular: Negative for chest pain, palpitations and leg swelling.  Gastrointestinal: Positive for abdominal pain, diarrhea, nausea and vomiting. Negative for anal bleeding, blood in stool and constipation.  Endocrine: Negative for polyuria.  Genitourinary: Negative for dysuria, frequency, hematuria, urgency, vaginal bleeding and vaginal discharge.  Musculoskeletal: Negative for back pain.  Skin: Negative for rash.  Allergic/Immunologic: Negative for immunocompromised state.  Neurological: Positive for seizures. Negative for dizziness, syncope, weakness, light-headedness, numbness and headaches.  Psychiatric/Behavioral: Negative for confusion.   Physical Exam Updated Vital Signs BP 127/85    Pulse 89    Temp 99.3 F (37.4 C) (Rectal)    Resp 16    Ht  (1.651 m)    Wt 85.7 kg    SpO2 99%    BMI 31.45 kg/m   Physical Exam Vitals signs and nursing note reviewed.  Constitutional:      General: She is not in acute distress.    Appearance: She is not diaphoretic.     Comments: Actively having rigors/tremors. Uncomfortable appearing.   HENT:     Head: Normocephalic.  Eyes:     Conjunctiva/sclera: Conjunctivae normal.  Neck:     Musculoskeletal: Neck supple.  Cardiovascular:     Rate and Rhythm: Normal rate and regular rhythm.     Pulses: Normal pulses.     Heart sounds: Normal heart sounds. No murmur. No friction rub. No gallop.   Pulmonary:     Effort: Pulmonary effort is normal. No respiratory distress.     Breath sounds: No stridor. No wheezing, rhonchi or rales.  Chest:     Chest wall: No tenderness.  Abdominal:     General: There is no distension.      Palpations: Abdomen is soft.     Tenderness: There is abdominal tenderness. There is no right CVA tenderness or guarding.     Comments: Curled up in the fetal position and holding her abdomen.  Diffusely tender to palpation throughout the abdomen, but maximal tenderness is in the left upper quadrant and epigastric region.  Negative Murphy sign.  No CVA tenderness bilaterally.  No tenderness over McBurney's point.  No rebound or guarding.  Skin:  General: Skin is warm.     Coloration: Skin is not jaundiced.     Findings: No rash.  Neurological:     Mental Status: She is alert.  Psychiatric:        Behavior: Behavior normal.    ED Treatments / Results  Labs (all labs ordered are listed, but only abnormal results are displayed) Labs Reviewed  COMPREHENSIVE METABOLIC PANEL - Abnormal; Notable for the following components:      Result Value   CO2 16 (*)    Glucose, Bld 113 (*)    BUN 5 (*)    Total Protein 8.6 (*)    All other components within normal limits  CBC - Abnormal; Notable for the following components:   WBC 12.1 (*)    All other components within normal limits  C DIFFICILE QUICK SCREEN W PCR REFLEX  GASTROINTESTINAL PANEL BY PCR, STOOL (REPLACES STOOL CULTURE)  HCG, QUANTITATIVE, PREGNANCY  LACTIC ACID, PLASMA  POC OCCULT BLOOD, ED  TYPE AND SCREEN    EKG EKG Interpretation  Date/Time:  Monday October 16 2019 20:40:41 EST Ventricular Rate:  117 PR Interval:    QRS Duration: 86 QT Interval:  371 QTC Calculation: 518 R Axis:   83 Text Interpretation: Sinus tachycardia Low voltage, precordial leads Borderline T abnormalities, diffuse leads Prolonged QT interval Baseline wander in lead(s) V4 No significant change since last tracing Confirmed by Rochele Raring [phone]) on 10/17/2019 12:18:52 AM   Radiology Ct Abdomen Pelvis W Contrast  Result Date: 10/17/2019 CLINICAL DATA:  Nausea and vomiting. EXAM: CT ABDOMEN AND PELVIS WITH CONTRAST TECHNIQUE: Multidetector  CT imaging of the abdomen and pelvis was performed using the standard protocol following bolus administration of intravenous contrast. CONTRAST:  OMNIPAQUE IOHEXOL 300 MG/ML  SOLN COMPARISON:  CT 12/05/2018 FINDINGS: Lower chest: Lung bases are clear. Patulous distal esophagus. No esophageal wall thickening. Hepatobiliary: Tiny hypodensity in the right lobe of the liver, too small to accurately characterize. Gallbladder physiologically distended, no calcified stone. No biliary dilatation. Pancreas: No ductal dilatation or inflammation. Spleen: Normal in size without focal abnormality. Small splenule at the hilum. Adrenals/Urinary Tract: Normal adrenal glands. No hydronephrosis or perinephric edema. Homogeneous renal enhancement. Urinary bladder is physiologically distended without wall thickening. Stomach/Bowel: Stomach is decompressed. No bowel obstruction or inflammatory change. Normal appendix. Small volume of colonic stool. No colonic wall thickening or inflammation. Vascular/Lymphatic: Normal caliber abdominal aorta. Patent portal vein. No adenopathy. Reproductive: Retroverted uterus. No adnexal mass. Minimal free fluid in the pelvis is physiologic. Other: Small fat containing umbilical hernia. No inflammatory change. No ascites or free air. No intra-abdominal abscess. Musculoskeletal: There are no acute or suspicious osseous abnormalities. IMPRESSION: No acute abnormality or explanation for abdominal pain. Unchanged small fat containing umbilical hernia. Electronically Signed   By: Narda Rutherford M.D.   On: 10/17/2019 04:47    Procedures Procedures (including critical care time)  Medications Ordered in ED Medications  sodium chloride (PF) 0.9 % injection (has no administration in time range)  morphine 4 MG/ML injection 4 mg (4 mg Intravenous Given 10/17/19 0053)  ondansetron (ZOFRAN) injection 4 mg (4 mg Intravenous Given 10/17/19 0052)  sodium chloride 0.9 % bolus 1,000 mL (0 mLs  Intravenous Stopped 10/17/19 0325)  sodium chloride 0.9 % bolus 1,000 mL (0 mLs Intravenous Stopped 10/17/19 0402)  famotidine (PEPCID) IVPB 20 mg premix (0 mg Intravenous Stopped 10/17/19 0402)  iohexol (OMNIPAQUE) 300 MG/ML solution 100 mL (100 mLs Intravenous Contrast Given 10/17/19 0430)  Initial Impression / Assessment and Plan / ED Course  I have reviewed the triage vital signs and the nursing notes.  Pertinent labs & imaging results that were available during my care of the patient were reviewed by me and considered in my medical decision making (see chart for details).        28 year old female with a history of asthma presenting with episodic diffuse cramping abdominal pain accompanied by nausea, vomiting, diarrhea, and subjective fevers and chills for the last year.  She has been having nausea, vomiting, diarrhea for the last week with abdominal pain.  She has been seen for previous symptoms before and recently establish care with gastroenterology.  On exam, she is having tremors versus rigors that she previously thought was seizure-like activity as her mother had witnessed her having similar shaking before, but she is awake and talking during these episodes.  Rectal temp was obtained, but she is afebrile.  She is tachycardic with heart rate into the 120s, but is otherwise hemodynamically stable.  On exam, she is diffusely tender to palpation throughout the abdomen, but abdomen is soft and nondistended.  Given frequent watery stools, could consider C. difficile, but the patient reports that she has had multiple episodes of similar symptoms that would last for about a week or so and then resolve spontaneously, but then return several weeks later.  States that she is becoming frustrated due to lack of diagnosis.  Reviewed gastroenterology notes and IBS was on the differential.  However, given chills and a mild leukocytosis of 12.1, will order C. difficile panel given that she has been  having 8-10 watery stools daily for the last week.  I suspect diarrhea is the etiology of her bicarb level of 16 on her labs.  Her lactate is normal.  She has no other electrolyte derangements.  She has been fluid resuscitated with 2 L and heart rate has improved.  Negative orthostatic vitals.  The patient was observed for several hours.  Nausea and vomiting has resolved with Zofran.  She reports that pain has significantly improved after 1 dose of pain medication.  Ordered C. difficile and GI stool panel, but the patient has not had one bowel movement despite being observed in the ER for the last 8 to 9 hours.  CT abdomen pelvis was obtained, which was unremarkable and did not show any evidence of colitis.  She did have some numbness in her legs, but was also endorsing tingling in her hands which she has had during previous episodes and are not new.  She has appeared very anxious and at one point was hyperventilating, but this is since resolved.  She had no neurologic deficits on exam.  Shared decision-making conversation with the patient.  Offered the patient admission as C. difficile was on the differential, but suspect this is less likely given the intermittent chronicity of her symptoms.  The patient reports that she is feeling better and is tolerating p.o. fluids at this time.  She would like to be discharged home and follow-up with her gastroenterology team.  I think that this is reasonable as her vital signs have normalized and she is tolerating fluids.  She was given strict return precautions to the emergency department.  She is hemodynamically stable and in no acute distress.  Doubt cholecystitis, appendicitis, diverticulitis, ovarian torsion, pancreatitis, pyelonephritis at this time.  I have a low suspicion for cannabinoid hyperemesis.  Safe for discharge to home with outpatient follow-up as indicated.  Final Clinical Impressions(s) /  ED Diagnoses   Final diagnoses:  Nausea vomiting and  diarrhea    ED Discharge Orders         Ordered    promethazine (PHENERGAN) 25 MG suppository  Every 6 hours PRN     10/17/19 0710    dicyclomine (BENTYL) 20 MG tablet  2 times daily     10/17/19 0710           Audon Heymann, Maree Erie A, PA-C 10/17/19 [place], Delice Bison, DO 10/17/19 [phone]

## 2019-10-16 NOTE — ED Triage Notes (Signed)
Patient is complaining she is having a seizure. Patient was observed by two nurses and was talking with O2 SAT at 100%. Patient states she has a lump in her stomach that they are testing for cancer. Patient states she is vomiting blood.

## 2019-10-17 ENCOUNTER — Encounter (HOSPITAL_COMMUNITY): Payer: Self-pay

## 2019-10-17 ENCOUNTER — Emergency Department (HOSPITAL_COMMUNITY): Payer: BLUE CROSS/BLUE SHIELD

## 2019-10-17 LAB — POC OCCULT BLOOD, ED: Fecal Occult Bld: NEGATIVE

## 2019-10-17 LAB — COMPREHENSIVE METABOLIC PANEL
ALT: 43 U/L (ref 0–44)
AST: 35 U/L (ref 15–41)
Albumin: 4.6 g/dL (ref 3.5–5.0)
Alkaline Phosphatase: 44 U/L (ref 38–126)
Anion gap: 14 (ref 5–15)
BUN: 5 mg/dL — ABNORMAL LOW (ref 6–20)
CO2: 16 mmol/L — ABNORMAL LOW (ref 22–32)
Calcium: 9.8 mg/dL (ref 8.9–10.3)
Chloride: 107 mmol/L (ref 98–111)
Creatinine, Ser: 0.96 mg/dL (ref 0.44–1.00)
GFR calc Af Amer: >60 mL/min (ref >60)
GFR calc non Af Amer: >60 mL/min (ref >60)
Glucose, Bld: 113 mg/dL — ABNORMAL HIGH (ref 70–99)
Potassium: 3.9 mmol/L (ref 3.5–5.1)
Sodium: 137 mmol/L (ref 135–145)
Total Bilirubin: 1.1 mg/dL (ref 0.3–1.2)
Total Protein: 8.6 g/dL — ABNORMAL HIGH (ref 6.5–8.1)

## 2019-10-17 LAB — LACTIC ACID, PLASMA: Lactic Acid, Venous: 1 mmol/L (ref 0.5–1.9)

## 2019-10-17 LAB — CBC
HCT: 41.5 % (ref 36.0–46.0)
Hemoglobin: 14.5 g/dL (ref 12.0–15.0)
MCH: 31.3 pg (ref 26.0–34.0)
MCHC: 34.9 g/dL (ref 30.0–36.0)
MCV: 89.4 fL (ref 80.0–100.0)
Platelets: 361 10*3/uL (ref 150–400)
RBC: 4.64 MIL/uL (ref 3.87–5.11)
RDW: 12.1 % (ref 11.5–15.5)
WBC: 12.1 10*3/uL — ABNORMAL HIGH (ref 4.0–10.5)
nRBC: 0 % (ref 0.0–0.2)

## 2019-10-17 LAB — HCG, QUANTITATIVE, PREGNANCY: hCG, Beta Chain, Quant, S: <1 m[IU]/mL (ref <5)

## 2019-10-17 MED ORDER — DICYCLOMINE HCL 20 MG PO TABS: 20.0000 mg | ORAL_TABLET | Freq: Two times a day (BID) | ORAL | 0 refills | Status: DC

## 2019-10-17 MED ORDER — PROMETHAZINE HCL 25 MG RE SUPP: 25.0000 mg | Freq: Four times a day (QID) | RECTAL | 0 refills | Status: DC | PRN

## 2019-10-17 MED ORDER — SODIUM CHLORIDE 0.9 % IV BOLUS
1000.0000 mL | Freq: Once | INTRAVENOUS | Status: AC
  Administered 2019-10-17: 1000 mL via INTRAVENOUS

## 2019-10-17 MED ORDER — IOHEXOL 300 MG/ML  SOLN
100.0000 mL | Freq: Once | INTRAMUSCULAR | Status: AC | PRN
  Administered 2019-10-17: 05:00:00 100 mL via INTRAVENOUS

## 2019-10-17 MED ORDER — SODIUM CHLORIDE (PF) 0.9 % IJ SOLN
INTRAMUSCULAR | Status: AC
  Filled 2019-10-17: qty 50

## 2019-10-17 MED ORDER — FAMOTIDINE IN NACL 20-0.9 MG/50ML-% IV SOLN
20.0000 mg | Freq: Once | INTRAVENOUS | Status: AC
  Administered 2019-10-17: 20 mg via INTRAVENOUS
  Filled 2019-10-17: qty 50

## 2019-10-17 NOTE — ED Notes (Signed)
Urine sent to lab in case its needed

## 2019-10-17 NOTE — ED Notes (Signed)
Pt provided with [place] for PO Challenge.

## 2019-10-17 NOTE — ED Notes (Signed)
Pt aware of the need for a stool sample. 

## 2019-10-17 NOTE — Discharge Instructions (Signed)
Thank you for allowing me to care for you today in the Emergency Department.   Please follow-up with gastroenterology as needed regarding your ER visit today.  You can place 1 promethazine suppository every 6 hours as needed for nausea or vomiting.  Take 1 tablet of Bentyl up to 2 times daily as needed for abdominal cramping.  If you continue to have frequent episodes of diarrhea, you should let your gastroenterologist now.   Return to the emergency department if you stop producing urine because you are dehydrated, develop high fevers, persistent vomiting despite taking Phenergan, uncontrollable pain, or other new, concerning symptoms.

## 2019-10-17 NOTE — ED Notes (Signed)
Unable to complete orthostatic vitals at this time PT c/o numbness in legs. RN have been made aware

## 2019-10-17 NOTE — ED Notes (Signed)
Called lab due to lactic acid not in process, per lab no lactic acid was received, will redraw.

## 2019-10-17 NOTE — ED Notes (Signed)
Pt ambulated to and from the bathroom with a steady gait. No assistance required.

## 2019-12-01 NOTE — L&D Delivery Note (Signed)
OB/GYN Faculty Practice Delivery Note  Jackie Newman is a 29 y.o. G2P1011 s/p vaginal delivery at [redacted]w[redacted]d. She was admitted for IOL secondary to cHTN.   ROM: 3h 69m with light meconium stained fluid GBS Status: positive >PCN on admission Maximum Maternal Temperature: 98.48F  Labor Progress: Pt received cytotec x1 with FB placement on admission. Pitocin was then started at 2115 on 09/09/20. AROM for light meconium-stained fluid was performed at 0125. Given recurrent deep decels, pitocin was discontinued and a dose of terbinafine was administered with good improvement in FHT s/p maternal repositioning. Pt was then found to have rapid change to complete cervical dilation and delivered after brief second stage as noted below.  Delivery Date/Time: 0522 on 09/10/20 Delivery: Called to room and patient was complete and pushing. Head delivered ROA. No nuchal cord present. Shoulder and body delivered in usual fashion. Infant with spontaneous cry, placed on mother's abdomen, dried and stimulated. Cord clamped x 2 after 1-minute delay, and cut by author under my direct supervision. Cord blood drawn. Placenta delivered spontaneously with gentle cord traction. Fundus firm with massage and Pitocin. Labia, perineum, vagina, and cervix were inspected, notable for second degree perineal laceration.   Placenta: intact, 3-vessel cord, sent to L&D Complications: none Lacerations: second degree perineal laceration EBL: 25 ml Analgesia: IV fentanyl, lidocaine with repair  Infant: female   APGARs 7 & 8   3311g  Lynnda Shields, MD OB/GYN Fellow, Faculty Practice

## 2019-12-04 HISTORY — PX: COLONOSCOPY WITH ESOPHAGOGASTRODUODENOSCOPY (EGD): SHX5779

## 2020-01-22 ENCOUNTER — Encounter: Payer: Self-pay | Admitting: Emergency Medicine

## 2020-01-22 ENCOUNTER — Other Ambulatory Visit: Payer: Self-pay

## 2020-01-22 ENCOUNTER — Ambulatory Visit
Admission: EM | Admit: 2020-01-22 | Discharge: 2020-01-22 | Disposition: A | Discharge status: Home / Self Care | Payer: BLUE CROSS/BLUE SHIELD | Source: Home / Self Care

## 2020-01-22 ENCOUNTER — Inpatient Hospital Stay (HOSPITAL_COMMUNITY): Payer: BLUE CROSS/BLUE SHIELD

## 2020-01-22 ENCOUNTER — Encounter: Payer: Self-pay | Admitting: Obstetrics and Gynecology

## 2020-01-22 ENCOUNTER — Inpatient Hospital Stay (HOSPITAL_COMMUNITY)
Admission: AD | Admit: 2020-01-22 | Discharge: 2020-01-22 | Disposition: A | Discharge status: Home / Self Care | Payer: BLUE CROSS/BLUE SHIELD | Attending: Obstetrics & Gynecology | Admitting: Obstetrics & Gynecology

## 2020-01-22 ENCOUNTER — Encounter (HOSPITAL_COMMUNITY): Payer: Self-pay | Admitting: Obstetrics & Gynecology

## 2020-01-22 DIAGNOSIS — R112 Nausea with vomiting, unspecified: Secondary | ICD-10-CM

## 2020-01-22 DIAGNOSIS — Z825 Family history of asthma and other chronic lower respiratory diseases: Secondary | ICD-10-CM | POA: Insufficient documentation

## 2020-01-22 DIAGNOSIS — R102 Pelvic and perineal pain: Secondary | ICD-10-CM

## 2020-01-22 DIAGNOSIS — Z87891 Personal history of nicotine dependence: Secondary | ICD-10-CM | POA: Insufficient documentation

## 2020-01-22 DIAGNOSIS — O210 Mild hyperemesis gravidarum: Secondary | ICD-10-CM | POA: Diagnosis present

## 2020-01-22 DIAGNOSIS — Z3201 Encounter for pregnancy test, result positive: Secondary | ICD-10-CM

## 2020-01-22 DIAGNOSIS — Z881 Allergy status to other antibiotic agents status: Secondary | ICD-10-CM | POA: Diagnosis not present

## 2020-01-22 DIAGNOSIS — Z3A01 Less than 8 weeks gestation of pregnancy: Secondary | ICD-10-CM

## 2020-01-22 DIAGNOSIS — O26891 Other specified pregnancy related conditions, first trimester: Secondary | ICD-10-CM | POA: Diagnosis not present

## 2020-01-22 DIAGNOSIS — R1032 Left lower quadrant pain: Secondary | ICD-10-CM

## 2020-01-22 DIAGNOSIS — R109 Unspecified abdominal pain: Secondary | ICD-10-CM | POA: Diagnosis present

## 2020-01-22 DIAGNOSIS — Z3490 Encounter for supervision of normal pregnancy, unspecified, unspecified trimester: Secondary | ICD-10-CM

## 2020-01-22 LAB — CBC
HCT: 37.1 % (ref 36.0–46.0)
Hemoglobin: 12.9 g/dL (ref 12.0–15.0)
MCH: 30.9 pg (ref 26.0–34.0)
MCHC: 34.8 g/dL (ref 30.0–36.0)
MCV: 89 fL (ref 80.0–100.0)
Platelets: 321 10*3/uL (ref 150–400)
RBC: 4.17 MIL/uL (ref 3.87–5.11)
RDW: 12.1 % (ref 11.5–15.5)
WBC: 11.4 10*3/uL — ABNORMAL HIGH (ref 4.0–10.5)
nRBC: 0 % (ref 0.0–0.2)

## 2020-01-22 LAB — HCG, QUANTITATIVE, PREGNANCY: hCG, Beta Chain, Quant, S: 27227 m[IU]/mL — ABNORMAL HIGH (ref <5)

## 2020-01-22 LAB — HIV ANTIBODY (ROUTINE TESTING W REFLEX): HIV Screen 4th Generation wRfx: NONREACTIVE

## 2020-01-22 LAB — WET PREP, GENITAL
Clue Cells Wet Prep HPF POC: NONE SEEN
Sperm: NONE SEEN
Trich, Wet Prep: NONE SEEN
Yeast Wet Prep HPF POC: NONE SEEN

## 2020-01-22 LAB — ABO/RH: ABO/RH(D): O POS

## 2020-01-22 LAB — POCT URINE PREGNANCY: Preg Test, Ur: POSITIVE — AB

## 2020-01-22 MED ORDER — ONDANSETRON 4 MG PO TBDP
4.0000 mg | ORAL_TABLET | Freq: Once | ORAL | Status: AC
  Administered 2020-01-22: 4 mg via ORAL

## 2020-01-22 MED ORDER — PROMETHAZINE HCL 25 MG RE SUPP: 25.0000 mg | Freq: Four times a day (QID) | RECTAL | 3 refills | Status: DC | PRN

## 2020-01-22 MED ORDER — METOCLOPRAMIDE HCL 10 MG PO TABS: 10.0000 mg | ORAL_TABLET | Freq: Four times a day (QID) | ORAL | 0 refills | Status: DC

## 2020-01-22 NOTE — Discharge Instructions (Addendum)
Recommend you go to [facility] hospital for further evaluation.

## 2020-01-22 NOTE — Discharge Instructions (Signed)

## 2020-01-22 NOTE — MAU Provider Note (Signed)
History     CSN: [id number]  Arrival date and time: 01/22/20 1755   First Provider Initiated Contact with Patient 01/22/20 1928      Chief Complaint  Patient presents with  . Abdominal Pain  . Emesis  . Diarrhea   HPI  Ms.  Jackie Newman is a 29 y.o. year old G14P0010 female at [redacted]w[redacted]d weeks gestation who presents to MAU reporting mid to lower LT pelvic pain and N/V since Friday. She had a (+) HPT on Friday 01/19/2020. She went to [facility] today where she received Zofran. She was then instructed to come to MAU for ectopic evaluation d/t the significant lower abdominal pain she had on exam there.  Past Medical History:  Diagnosis Date  . Asthma   . Congenital umbilical hernia   . Seizures ([place])     Past Surgical History:  Procedure Laterality Date  . NO PAST SURGERIES      Family History  Problem Relation Age of Onset  . Asthma Father   . Gout Father     Social History   Tobacco Use  . Smoking status: Former Smoker    Packs/day: 5.00    Types: Cigars  . Smokeless tobacco: Never Used  Substance Use Topics  . Alcohol use: No    Comment: occasional socially  . Drug use: Yes    Types: Marijuana    Comment: quit 3-4 weeks ago    Allergies:  Allergies  Allergen Reactions  . Doxycycline Nausea And Vomiting    No medications prior to admission.    Review of Systems  Constitutional: Negative.   HENT: Negative.   Eyes: Negative.   Respiratory: Negative.   Cardiovascular: Negative.   Gastrointestinal: Positive for nausea and vomiting.  Endocrine: Negative.   Genitourinary: Positive for pelvic pain (lower mid to LT).  Musculoskeletal: Negative.   Skin: Negative.   Allergic/Immunologic: Negative.   Neurological: Negative.   Hematological: Negative.   Psychiatric/Behavioral: Negative.    Physical Exam   Blood pressure 138/76, pulse 94, temperature 99.2 F (37.3 C), temperature source Oral, resp. rate 20, height 5\' 5"  (1.651 m), weight 81.2  kg, last menstrual period 12/11/2019, SpO2 99 %.  Physical Exam  Nursing note and vitals reviewed. Constitutional: She is oriented to person, place, and time. She appears well-developed and well-nourished.  HENT:  Head: Normocephalic and atraumatic.  Eyes: Pupils are equal, round, and reactive to light.  Cardiovascular: Normal rate.  Respiratory: Effort normal.  GI: Soft.  Genitourinary:    Genitourinary Comments: Uterus: non-tender, SE: cervix is smooth, pink, no lesions, small amt of thick, white vaginal d/c -- WP, GC/CT done, closed/long/firm, no CMT or friability, moderate LT adnexal tenderness    Musculoskeletal:        General: Normal range of motion.     Cervical back: Normal range of motion.  Neurological: She is alert and oriented to person, place, and time.  Skin: Skin is warm and dry.  Psychiatric: She has a normal mood and affect. Her behavior is normal. Judgment and thought content normal.    MAU Course  Procedures  MDM CCUA UPT CBC ABO/Rh -- known O POSITIVE HCG Wet Prep GC/CT -- pending HIV -- pending OB < 14 wks [place] with TV  Results for orders placed or performed during the hospital encounter of 01/22/20 (from the past 24 hour(s))  CBC     Status: Abnormal   Collection Time: 01/22/20  7:29 PM  Result Value Ref Range  WBC 11.4 (H) 4.0 - 10.5 K/uL   RBC 4.17 3.87 - 5.11 MIL/uL   Hemoglobin 12.9 12.0 - 15.0 g/dL   HCT 22.2 97.9 - 89.2 %   MCV 89.0 80.0 - 100.0 fL   MCH 30.9 26.0 - 34.0 pg   MCHC 34.8 30.0 - 36.0 g/dL   RDW 11.9 41.7 - 40.8 %   Platelets 321 150 - 400 K/uL   nRBC 0.0 0.0 - 0.2 %  ABO/Rh     Status: None   Collection Time: 01/22/20  7:29 PM  Result Value Ref Range   ABO/RH(D) O POS    No rh immune globuloin      NOT A RH IMMUNE GLOBULIN CANDIDATE, PT RH POSITIVE Performed at [facility], [address], [place], [place] [address]   hCG, quantitative, pregnancy     Status: Abnormal   Collection Time: 01/22/20  7:29 PM   Result Value Ref Range   hCG, Beta Chain, Quant, S 27,227 (H) <5 mIU/mL  HIV Antibody (routine testing w rflx)     Status: None   Collection Time: 01/22/20  7:29 PM  Result Value Ref Range   HIV Screen 4th Generation wRfx NON REACTIVE NON REACTIVE  Wet prep, genital     Status: Abnormal   Collection Time: 01/22/20  7:40 PM   Specimen: Cervix  Result Value Ref Range   Yeast Wet Prep HPF POC NONE SEEN NONE SEEN   Trich, Wet Prep NONE SEEN NONE SEEN   Clue Cells Wet Prep HPF POC NONE SEEN NONE SEEN   WBC, Wet Prep HPF POC MANY (A) NONE SEEN   Sperm NONE SEEN     US OB LESS THAN 14 WEEKS WITH OB TRANSVAGINAL  Result Date: 01/22/2020 CLINICAL DATA:  Lower abdominal pain for 3 days, positive urine pregnancy test, gravida 2 para 0 abortion 1 EXAM: OBSTETRIC <14 WK ULTRASOUND TECHNIQUE: Transabdominal ultrasound was performed for evaluation of the gestation as well as the maternal uterus and adnexal regions. COMPARISON:  None. FINDINGS: Intrauterine gestational sac: Single Yolk sac:  Visualized. Embryo:  Visualized. Cardiac Activity: Visualized. Heart Rate: 105 bpm CRL:   3.7 mm   6 w 0 d                  [place] EDC: 09/16/2020 Subchorionic hemorrhage:  None visualized. Maternal uterus/adnexae: Right ovary measures 2.8 x 1.9 x 1.5 cm and is unremarkable. The left ovary measures 4.5 x 5.7 x 3.3 cm, with a 4.8 cm simple cyst identified. Uterus is grossly unremarkable.  Cervix appears closed. IMPRESSION: 1. Single live intrauterine pregnancy as above, estimated age 29 weeks and 0 days. 2. 4.8 cm simple cyst left ovary. Electronically Signed   By: Sharlet Salina M.D.   On: 01/22/2020 20:35     Assessment and Plan  Intrauterine pregnancy - Plan: Discharge patient - Advised to start [facility] - Patient requested to be seen by MAU provider. Information given to call [facility] to schedule prenatal visits  Abdominal Pain in Pregnancy - Information provided on abd pain in pregnancy - Advised to take Tylenol  1000 mg every 6 hours prn pain - Advised to soak in a tub of warm water and/or apply heating pad to affected side for 15 mins on and 10 mins off repeating steps x 4 - Safe Medications in Pregnancy List given  Morning sickness  - Rx for Phenergan 25 mg suppositories renewed with 3 RF - Rx for Reglan 10 mg  every 6 hours. Advised to take in daytime and use Phenergan as bedtime medication. - Information provided on morning sickness - Advised to eat B.R.A.T. diet   - Discharge patient - Call to get scheduled for [facility] at [facility] (per pt request) - Patient verbalized an understanding of the plan of care and agrees.   Raelyn Mora, MSN, CNM 01/22/2020, 7:29 PM

## 2020-01-22 NOTE — ED Provider Notes (Signed)
[name]    CSN: [id number] Arrival date & time: 01/22/20  1629      History   Chief Complaint Chief Complaint  Patient presents with  . Abdominal Pain  . positive pregnancy    HPI Jernee Murtaugh is a 29 y.o. female presenting for lower abdominal pain, nausea, vomiting since Friday.  Patient denies biliary or bloody emesis, projectile vomiting, fever, arthralgias, myalgias.  Has not been able to control nausea/vomiting with dietary modifications.  Patient states that she found out she was pregnant on Friday as well with home pregnancy test.  Denies vaginal discharge, bleeding.   Past Medical History:  Diagnosis Date  . Asthma   . Congenital umbilical hernia   . Seizures ([place])     There are no problems to display for this patient.   Past Surgical History:  Procedure Laterality Date  . NO PAST SURGERIES      OB History    Gravida  2   Para      Term      Preterm      AB      Living        SAB      TAB      Ectopic      Multiple      Live Births               Home Medications    Prior to Admission medications   Medication Sig Start Date End Date Taking? Authorizing Provider  albuterol (PROVENTIL HFA;VENTOLIN HFA) 108 (90 Base) MCG/ACT inhaler Inhale 1-2 puffs into the lungs every 6 (six) hours as needed for wheezing or shortness of breath.    [provider]  dicyclomine (BENTYL) 20 MG tablet Take 1 tablet (20 mg total) by mouth 2 (two) times daily. 10/17/19   McDonald, Mia A, PA-C  promethazine (PHENERGAN) 25 MG suppository Place 1 suppository (25 mg total) rectally every 6 (six) hours as needed for nausea or vomiting. 10/17/19   McDonald, Laymond Purser, PA-C    Family History Family History  Problem Relation Age of Onset  . Asthma Father   . Gout Father     Social History Social History   Tobacco Use  . Smoking status: Former Smoker    Packs/day: 5.00    Types: Cigars  . Smokeless tobacco: Never Used    Substance Use Topics  . Alcohol use: No    Comment: occasional socially  . Drug use: Yes    Frequency: 7.0 times per week    Types: Marijuana     Allergies   Doxycycline   Review of Systems As per HPI   Physical Exam Triage Vital Signs ED Triage Vitals  Enc Vitals Group     BP 01/22/20 1643 133/80     Pulse Rate 01/22/20 1643 95     Resp 01/22/20 1643 18     Temp 01/22/20 1643 98.9 F (37.2 C)     Temp Source 01/22/20 1643 Oral     SpO2 01/22/20 1643 95 %     Weight --      Height --      Head Circumference --      Peak Flow --      Pain Score 01/22/20 1644 10     Pain Loc --      Pain Edu? --      Excl. in [place]? --    No data found.  Updated Vital Signs BP 133/80 (  BP Location: Right Arm)   Pulse 95   Temp 98.9 F (37.2 C) (Oral)   Resp 18   LMP 12/11/2019   SpO2 95%   Visual Acuity Right Eye Distance:   Left Eye Distance:   Bilateral Distance:    Right Eye Near:   Left Eye Near:    Bilateral Near:     Physical Exam Constitutional:      General: She is in acute distress.     Appearance: She is well-developed. She is not toxic-appearing or diaphoretic.     Comments: Second to pain  HENT:     Head: Normocephalic and atraumatic.  Eyes:     General: No scleral icterus.    Pupils: Pupils are equal, round, and reactive to light.  Cardiovascular:     Rate and Rhythm: Normal rate.  Pulmonary:     Effort: Pulmonary effort is normal.  Abdominal:     General: Bowel sounds are normal.     Palpations: Abdomen is soft.     Tenderness: There is abdominal tenderness in the suprapubic area and left lower quadrant. There is no right CVA tenderness, left CVA tenderness or guarding. Negative signs include Murphy's sign and McBurney's sign.  Skin:    Coloration: Skin is not jaundiced or pale.  Neurological:     Mental Status: She is alert and oriented to person, place, and time.      UC Treatments / Results  Labs (all labs ordered are listed, but only  abnormal results are displayed) Labs Reviewed  POCT URINE PREGNANCY - Abnormal; Notable for the following components:      Result Value   Preg Test, Ur Positive (*)    All other components within normal limits    EKG   Radiology No results found.  Procedures Procedures (including critical care time)  Medications Ordered in UC Medications  ondansetron (ZOFRAN-ODT) disintegrating tablet 4 mg (4 mg Oral Given 01/22/20 1700)    Initial Impression / Assessment and Plan / UC Course  I have reviewed the triage vital signs and the nursing notes.  Pertinent labs & imaging results that were available during my care of the patient were reviewed by me and considered in my medical decision making (see chart for details).     Patient afebrile, nontoxic in office today.  Patient does appear to be in significant pain, more focally left suprapubic.  Urine pregnancy positive.  Patient given Zofran ODT without improvement in nausea or vomiting.  Given nausea, vomiting, severe abdominal pain, [facility]/management (r/o ectopic pregnancy).  Patient electing to self transport with boyfriend to MAU in stable condition.   Final Clinical Impressions(s) / UC Diagnoses   Final diagnoses:  Less than [redacted] weeks gestation of pregnancy  Intractable vomiting with nausea, unspecified vomiting type  LLQ pain  Suprapubic pain, acute     Discharge Instructions     Recommend you go to [facility] hospital for further evaluation.    ED Prescriptions    None     PDMP not reviewed this encounter.   Hall[place]Potvin, [place], [place] 01/22/20 1730

## 2020-01-22 NOTE — Progress Notes (Signed)
No Rx needed -erroneous encounter

## 2020-01-22 NOTE — MAU Note (Signed)
Only preg test done at [facility], unable to urinate at this time.

## 2020-01-22 NOTE — MAU Note (Signed)
Having lower abd, started last Friday-felt like period was going to start..  Also having vomiting and diarrhea (4-5 loose), both started on Friday.

## 2020-01-22 NOTE — ED Triage Notes (Signed)
Patient presents to [facility] for assessment of lower mid abdominal pain, nausea, and emesis since Friday.  States she also had a positive pregnancy test on Friday.

## 2020-01-24 LAB — GC/CHLAMYDIA PROBE AMP (~~LOC~~) NOT AT ARMC
Chlamydia: NEGATIVE
Comment: NEGATIVE
Comment: NORMAL
Neisseria Gonorrhea: NEGATIVE

## 2020-01-29 ENCOUNTER — Encounter (HOSPITAL_COMMUNITY): Payer: Self-pay | Admitting: Obstetrics & Gynecology

## 2020-01-29 ENCOUNTER — Inpatient Hospital Stay (HOSPITAL_COMMUNITY)
Admission: AD | Admit: 2020-01-29 | Discharge: 2020-01-29 | Disposition: A | Discharge status: Home / Self Care | Payer: BLUE CROSS/BLUE SHIELD | Attending: Obstetrics & Gynecology | Admitting: Obstetrics & Gynecology

## 2020-01-29 ENCOUNTER — Other Ambulatory Visit: Payer: Self-pay

## 2020-01-29 DIAGNOSIS — O219 Vomiting of pregnancy, unspecified: Secondary | ICD-10-CM

## 2020-01-29 DIAGNOSIS — Z87891 Personal history of nicotine dependence: Secondary | ICD-10-CM | POA: Insufficient documentation

## 2020-01-29 DIAGNOSIS — O99611 Diseases of the digestive system complicating pregnancy, first trimester: Secondary | ICD-10-CM | POA: Insufficient documentation

## 2020-01-29 DIAGNOSIS — Z3A01 Less than 8 weeks gestation of pregnancy: Secondary | ICD-10-CM | POA: Diagnosis not present

## 2020-01-29 DIAGNOSIS — K117 Disturbances of salivary secretion: Secondary | ICD-10-CM | POA: Insufficient documentation

## 2020-01-29 DIAGNOSIS — Z3490 Encounter for supervision of normal pregnancy, unspecified, unspecified trimester: Secondary | ICD-10-CM

## 2020-01-29 DIAGNOSIS — O210 Mild hyperemesis gravidarum: Secondary | ICD-10-CM | POA: Diagnosis not present

## 2020-01-29 LAB — URINALYSIS, ROUTINE W REFLEX MICROSCOPIC
Bilirubin Urine: NEGATIVE
Glucose, UA: NEGATIVE mg/dL
Hgb urine dipstick: NEGATIVE
Ketones, ur: 80 mg/dL — AB
Nitrite: NEGATIVE
Protein, ur: 100 mg/dL — AB
Specific Gravity, Urine: 1.034 — ABNORMAL HIGH (ref 1.005–1.030)
WBC, UA: >50 WBC/hpf — ABNORMAL HIGH (ref 0–5)
pH: 5 (ref 5.0–8.0)

## 2020-01-29 LAB — BASIC METABOLIC PANEL
Anion gap: 13 (ref 5–15)
BUN: <5 mg/dL — ABNORMAL LOW (ref 6–20)
CO2: 22 mmol/L (ref 22–32)
Calcium: 9.3 mg/dL (ref 8.9–10.3)
Chloride: 98 mmol/L (ref 98–111)
Creatinine, Ser: 0.88 mg/dL (ref 0.44–1.00)
GFR calc Af Amer: >60 mL/min (ref >60)
GFR calc non Af Amer: >60 mL/min (ref >60)
Glucose, Bld: 86 mg/dL (ref 70–99)
Potassium: 3.4 mmol/L — ABNORMAL LOW (ref 3.5–5.1)
Sodium: 133 mmol/L — ABNORMAL LOW (ref 135–145)

## 2020-01-29 LAB — CBC
HCT: 40.1 % (ref 36.0–46.0)
Hemoglobin: 14 g/dL (ref 12.0–15.0)
MCH: 31.1 pg (ref 26.0–34.0)
MCHC: 34.9 g/dL (ref 30.0–36.0)
MCV: 89.1 fL (ref 80.0–100.0)
Platelets: 329 10*3/uL (ref 150–400)
RBC: 4.5 MIL/uL (ref 3.87–5.11)
RDW: 11.9 % (ref 11.5–15.5)
WBC: 9.2 10*3/uL (ref 4.0–10.5)
nRBC: 0 % (ref 0.0–0.2)

## 2020-01-29 MED ORDER — SCOPOLAMINE 1 MG/3DAYS TD PT72
1.0000 | MEDICATED_PATCH | TRANSDERMAL | Status: DC
  Administered 2020-01-29: 1.5 mg via TRANSDERMAL
  Filled 2020-01-29: qty 1

## 2020-01-29 MED ORDER — SCOPOLAMINE 1 MG/3DAYS TD PT72: 1.0000 | MEDICATED_PATCH | TRANSDERMAL | 0 refills | Status: DC

## 2020-01-29 MED ORDER — GLYCOPYRROLATE 1 MG PO TABS: 1.0000 mg | ORAL_TABLET | Freq: Three times a day (TID) | ORAL | 0 refills | Status: DC | PRN

## 2020-01-29 MED ORDER — PROMETHAZINE HCL 25 MG/ML IJ SOLN
12.5000 mg | Freq: Once | INTRAMUSCULAR | Status: AC
  Administered 2020-01-29: 12.5 mg via INTRAVENOUS
  Filled 2020-01-29: qty 1

## 2020-01-29 MED ORDER — LACTATED RINGERS IV BOLUS
1000.0000 mL | Freq: Once | INTRAVENOUS | Status: AC
  Administered 2020-01-29: 1000 mL via INTRAVENOUS

## 2020-01-29 MED ORDER — FAMOTIDINE IN NACL 20-0.9 MG/50ML-% IV SOLN
20.0000 mg | Freq: Once | INTRAVENOUS | Status: AC
  Administered 2020-01-29: 20 mg via INTRAVENOUS
  Filled 2020-01-29: qty 50

## 2020-01-29 MED ORDER — LACTATED RINGERS IV SOLN: INTRAVENOUS | Status: DC

## 2020-01-29 NOTE — Discharge Instructions (Signed)
Hyperemesis Gravidarum Hyperemesis gravidarum is a severe form of nausea and vomiting that happens during pregnancy. Hyperemesis is worse than morning sickness. It may cause you to have nausea or vomiting all day for many days. It may keep you from eating and drinking enough food and liquids, which can lead to dehydration, malnutrition, and weight loss. Hyperemesis usually occurs during the first half (the first 20 weeks) of pregnancy. It often goes away once a woman is in her second half of pregnancy. However, sometimes hyperemesis continues through an entire pregnancy. What are the causes? The cause of this condition is not known. It may be related to changes in chemicals (hormones) in the body during pregnancy, such as the high level of pregnancy hormone (human chorionic gonadotropin) or the increase in the female sex hormone (estrogen). What are the signs or symptoms? Symptoms of this condition include:  Nausea that does not go away.  Vomiting that does not allow you to keep any food down.  Weight loss.  Body fluid loss (dehydration).  Having no desire to eat, or not liking food that you have previously enjoyed. How is this diagnosed? This condition may be diagnosed based on:  A physical exam.  Your medical history.  Your symptoms.  Blood tests.  Urine tests. How is this treated? This condition is managed by controlling symptoms. This may include:  Following an eating plan. This can help lessen nausea and vomiting.  Taking prescription medicines. An eating plan and medicines are often used together to help control symptoms. If medicines do not help relieve nausea and vomiting, you may need to receive fluids through an IV at the hospital. Follow these instructions at home: Eating and drinking   Avoid the following: ? Drinking fluids with meals. Try not to drink anything during the 30 minutes before and after your meals. ? Drinking more than 1 cup of fluid at a  time. ? Eating foods that trigger your symptoms. These may include spicy foods, coffee, high-fat foods, very sweet foods, and acidic foods. ? Skipping meals. Nausea can be more intense on an empty stomach. If you cannot tolerate food, do not force it. Try sucking on ice chips or other frozen items and make up for missed calories later. ? Lying down within 2 hours after eating. ? Being exposed to environmental triggers. These may include food smells, smoky rooms, closed spaces, rooms with strong smells, warm or humid places, overly loud and noisy rooms, and rooms with motion or flickering lights. Try eating meals in a well-ventilated area that is free of strong smells. ? Quick and sudden changes in your movement. ? Taking iron pills and multivitamins that contain iron. If you take prescription iron pills, do not stop taking them unless your health care provider approves. ? Preparing food. The smell of food can spoil your appetite or trigger nausea.  To help relieve your symptoms: ? Listen to your body. Everyone is different and has different preferences. Find what works best for you. ? Eat and drink slowly. ? Eat 5-6 small meals daily instead of 3 large meals. Eating small meals and snacks can help you avoid an empty stomach. ? In the morning, before getting out of bed, eat a couple of crackers to avoid moving around on an empty stomach. ? Try eating starchy foods as these are usually tolerated well. Examples include cereal, toast, bread, potatoes, pasta, rice, and pretzels. ? Include at least 1 serving of protein with your meals and snacks. Protein options include   lean meats, poultry, seafood, beans, nuts, nut butters, eggs, cheese, and yogurt. ? Try eating a protein-rich snack before bed. Examples of a protein-rick snack include cheese and crackers or a peanut butter sandwich made with 1 slice of whole-wheat bread and 1 tsp (5 g) of peanut butter. ? Eat or suck on things that have ginger in them.  It may help relieve nausea. Add  tsp ground ginger to hot tea or choose ginger tea. ? Try drinking 100% fruit juice or an electrolyte drink. An electrolyte drink contains sodium, potassium, and chloride. ? Drink fluids that are cold, clear, and carbonated or sour. Examples include lemonade, ginger ale, lemon-lime soda, ice water, and sparkling water. ? Brush your teeth or use a mouth rinse after meals. ? Talk with your health care provider about starting a supplement of vitamin B6. General instructions  Take over-the-counter and prescription medicines only as told by your health care provider.  Follow instructions from your health care provider about eating or drinking restrictions.  Continue to take your prenatal vitamins as told by your health care provider. If you are having trouble taking your prenatal vitamins, talk with your health care provider about different options.  Keep all follow-up and pre-birth (prenatal) visits as told by your health care provider. This is important. Contact a health care provider if:  You have pain in your abdomen.  You have a severe headache.  You have vision problems.  You are losing weight.  You feel weak or dizzy. Get help right away if:  You cannot drink fluids without vomiting.  You vomit blood.  You have constant nausea and vomiting.  You are very weak.  You faint.  You have a fever and your symptoms suddenly get worse. Summary  Hyperemesis gravidarum is a severe form of nausea and vomiting that happens during pregnancy.  Making some changes to your eating habits may help relieve nausea and vomiting.  This condition may be managed with medicine.  If medicines do not help relieve nausea and vomiting, you may need to receive fluids through an IV at the hospital. This information is not intended to replace advice given to you by your health care provider. Make sure you discuss any questions you have with your health care  provider. Document Revised: 12/06/2017 Document Reviewed: 07/15/2016 Elsevier Patient Education  2020 Elsevier Inc.  

## 2020-01-29 NOTE — MAU Provider Note (Addendum)
History     CSN: [id number]  Arrival date and time: 01/29/20 1635   None     Chief Complaint  Patient presents with  . Emesis  . Abdominal Pain   Jackie Newman is a 29yo G2P0 at [redacted]w[redacted]d who presents with nausea and vomiting with >20 periods of emesis over the last 24 hours. She states she has had nausea for the last several weeks and had been throwing up about 6-7x per day over the last week, however this got significantly worse yesterday prompting her to come in. Her vomiting is non bloody/nonbilious. She states she'd tried some zofran and reglan in the past which did not help with this. She denies headache, blurry vision, abdominal pains (other than some mild soreness from vomiting), pelvic pain, vaginal bleeding, vaginal discharge, urinary frequency. Patient states she had smoked marijuana daily several months ago but stopped before surgery in Jan and had no nausea or vomiting between then and mid Feb   OB History    Gravida  2   Para      Term      Preterm      AB  1   Living        SAB  1   TAB      Ectopic      Multiple      Live Births              Past Medical History:  Diagnosis Date  . Asthma   . Congenital umbilical hernia   . Seizures ([place])     Past Surgical History:  Procedure Laterality Date  . NO PAST SURGERIES      Family History  Problem Relation Age of Onset  . Asthma Father   . Gout Father     Social History   Tobacco Use  . Smoking status: Former Smoker    Packs/day: 5.00    Types: Cigars  . Smokeless tobacco: Never Used  Substance Use Topics  . Alcohol use: No    Comment: occasional socially  . Drug use: Yes    Types: Marijuana    Comment: quit 3-4 weeks ago    Allergies:  Allergies  Allergen Reactions  . Doxycycline Nausea And Vomiting    Medications Prior to Admission  Medication Sig Dispense Refill Last Dose  . albuterol (PROVENTIL HFA;VENTOLIN HFA) 108 (90 Base) MCG/ACT inhaler Inhale 1-2 puffs into the  lungs every 6 (six) hours as needed for wheezing or shortness of breath.     . metoCLOPramide (REGLAN) 10 MG tablet Take 1 tablet (10 mg total) by mouth every 6 (six) hours. 30 tablet 0  at not taking  . promethazine (PHENERGAN) 25 MG suppository Place 1 suppository (25 mg total) rectally every 6 (six) hours as needed for nausea or vomiting. Insert vaginally 12 each 3  at not taking    Review of Systems  Constitutional: Negative for chills and fever.  Eyes: Negative for visual disturbance.  Respiratory: Negative for shortness of breath.   Cardiovascular: Negative for chest pain.  Gastrointestinal: Positive for nausea and vomiting. Negative for abdominal pain and constipation.  Genitourinary: Negative for frequency, pelvic pain, vaginal bleeding, vaginal discharge and vaginal pain.  Neurological: Negative for headaches.   Physical Exam   Blood pressure 132/65, pulse 70, temperature 99.2 F (37.3 C), temperature source Oral, resp. rate 18, height 5\' 5"  (1.651 m), weight 82.8 kg, last menstrual period 12/11/2019, SpO2 98 %.  Physical Exam  Constitutional: She appears  well-developed and well-nourished.  HENT:  Head: Normocephalic and atraumatic.  Cardiovascular: Normal rate and regular rhythm.  Respiratory: Effort normal and breath sounds normal.  GI: Soft. Bowel sounds are normal. There is no abdominal tenderness.  Neurological: She is alert.  Skin: Skin is warm and dry.    MAU Course  Procedures Results for orders placed or performed during the hospital encounter of 01/29/20 (from the past 24 hour(s))  Urinalysis, Routine w reflex microscopic     Status: Abnormal   Collection Time: 01/29/20  4:43 PM  Result Value Ref Range   Color, Urine YELLOW YELLOW   APPearance HAZY (A) CLEAR   Specific Gravity, Urine 1.034 (H) 1.005 - 1.030   pH 5.0 5.0 - 8.0   Glucose, UA NEGATIVE NEGATIVE mg/dL   Hgb urine dipstick NEGATIVE NEGATIVE   Bilirubin Urine NEGATIVE NEGATIVE   Ketones, ur 80  (A) NEGATIVE mg/dL   Protein, ur 017 (A) NEGATIVE mg/dL   Nitrite NEGATIVE NEGATIVE   Leukocytes,Ua MODERATE (A) NEGATIVE   RBC / HPF 0-5 0 - 5 RBC/hpf   WBC, UA >50 (H) 0 - 5 WBC/hpf   Bacteria, UA FEW (A) NONE SEEN   Squamous Epithelial / LPF 0-5 0 - 5   Mucus PRESENT   Basic metabolic panel     Status: Abnormal   Collection Time: 01/29/20  6:54 PM  Result Value Ref Range   Sodium 133 (L) 135 - 145 mmol/L   Potassium 3.4 (L) 3.5 - 5.1 mmol/L   Chloride 98 98 - 111 mmol/L   CO2 22 22 - 32 mmol/L   Glucose, Bld 86 70 - 99 mg/dL   BUN <5 (L) 6 - 20 mg/dL   Creatinine, Ser 4.94 0.44 - 1.00 mg/dL   Calcium 9.3 8.9 - 49.6 mg/dL   GFR calc non Af Amer >60 >60 mL/min   GFR calc Af Amer >60 >60 mL/min   Anion gap 13 5 - 15  CBC     Status: None   Collection Time: 01/29/20  6:54 PM  Result Value Ref Range   WBC 9.2 4.0 - 10.5 K/uL   RBC 4.50 3.87 - 5.11 MIL/uL   Hemoglobin 14.0 12.0 - 15.0 g/dL   HCT 75.9 16.3 - 84.6 %   MCV 89.1 80.0 - 100.0 fL   MCH 31.1 26.0 - 34.0 pg   MCHC 34.9 30.0 - 36.0 g/dL   RDW 65.9 93.5 - 70.1 %   Platelets 329 150 - 400 K/uL   nRBC 0.0 0.0 - 0.2 %    MDM Urinalysis suggests dehydration with specific gravity of 1.034. Will check CBC/BMP. Gave 1,051mL LR bolus and stared LR at maintenance. Will give phenergan, famotidine, and scopolamine patch for nausea and can attempt a PO challenge after this. CBC shows no anemia and wbc within normal limits. Patient handed off to [employer].  Assessment and Plan    Jackie Newman 01/29/2020, 7:39 PM   I confirm that I have verified the information documented in the resident's note and that I have also personally performed the history, physical exam and all medical decision making activities of this service and have verified that all service and findings are accurately documented in this student's note.        Patient given IV fluids, phenergan, scop patch, & pepcid. Reports improvement in symptoms & able to  tolerate POs.  Will discharge home with Rx scop patch & robinul as patient is requesting medication for her excess  saliva.    A:  1. Nausea and vomiting during pregnancy prior to [redacted] weeks gestation   2. Intrauterine pregnancy   3. Morning sickness   4. [redacted] weeks gestation of pregnancy   5. Ptyalism    P: Discharge home Rx scop patch & robinul Discussed reasons to return to MAU Keep f/u with ob  Judeth Horn, NP

## 2020-01-29 NOTE — MAU Note (Signed)
Jackie Newman is a 29 y.o. at [redacted]w[redacted]d here in MAU reporting: vomiting nonstop. States she cannot hold down fluids. Tried medication previously Rx at last MAU visit but it did not work. Emesis x >20 in the last 24 hours. No nausea. Having some cramping but no bleeding or discharge.  Onset of complaint: ongoing  Pain score: 7/10  Vitals:   01/29/20 1700  BP: 121/90  Pulse: 84  Resp: 18  Temp: 99.2 F (37.3 C)  SpO2: 98%     Lab orders placed from triage: UA

## 2020-02-13 ENCOUNTER — Ambulatory Visit (INDEPENDENT_AMBULATORY_CARE_PROVIDER_SITE_OTHER): Payer: BLUE CROSS/BLUE SHIELD | Admitting: *Deleted

## 2020-02-13 VITALS — Wt 189.0 lb

## 2020-02-13 DIAGNOSIS — O210 Mild hyperemesis gravidarum: Secondary | ICD-10-CM

## 2020-02-13 DIAGNOSIS — Z3490 Encounter for supervision of normal pregnancy, unspecified, unspecified trimester: Secondary | ICD-10-CM

## 2020-02-13 DIAGNOSIS — Z3480 Encounter for supervision of other normal pregnancy, unspecified trimester: Secondary | ICD-10-CM | POA: Insufficient documentation

## 2020-02-13 MED ORDER — BLOOD PRESSURE MONITOR AUTOMAT DEVI: 1.0000 | Freq: Every day | 0 refills | Status: DC

## 2020-02-13 MED ORDER — GOJJI WEIGHT SCALE MISC: 1.0000 | Freq: Every day | 0 refills | Status: DC | PRN

## 2020-02-13 NOTE — Progress Notes (Signed)
  Virtual Visit via Telephone Note  I connected with Jackie Newman on 02/13/20 at  3:30 PM EDT by telephone and verified that I am speaking with the correct person using two identifiers.  Location: Patient: Jackie Newman MRN: [id number] Provider: Clovis Pu, RN   I discussed the limitations, risks, security and privacy concerns of performing an evaluation and management service by telephone and the availability of in person appointments. I also discussed with the patient that there may be a patient responsible charge related to this service. The patient expressed understanding and agreed to proceed.   History of Present Illness: PRENATAL INTAKE SUMMARY  Jackie Newman presents today New OB Nurse Interview.  OB History    Gravida  2   Para      Term      Preterm      AB  1   Living        SAB  1   TAB      Ectopic      Multiple      Live Births             I have reviewed the patient's medical, obstetrical, social, and family histories, medications, and available lab results.  SUBJECTIVE She complains of nausea without vomiting. Medication given by different provider.   Observations/Objective: Initial nurse interview for history/labs (New OB)  EDD: 09/16/20 by LMP GA: [redacted]w[redacted]d G2P0010 FHT: non face to face interview  GENERAL APPEARANCE: non face to face interview  Assessment and Plan: Normal pregnancy Prenatal care-[facility] Patient to sign up for babyscripts Continue Prenatal gummies Continue current meds for n/v Rx for Blood pressure monitor and weight scale sent to [facility] Labs to be completed at next visit with provider Next appt 03/06/20 with Donia Ast, NP  Follow Up Instructions:   I discussed the assessment and treatment plan with the patient. The patient was provided an opportunity to ask questions and all were answered. The patient agreed with the plan and demonstrated an understanding of the instructions.   The  patient was advised to call back or seek an in-person evaluation if the symptoms worsen or if the condition fails to improve as anticipated.  I provided 20 minutes of non-face-to-face time during this encounter.   Clovis Pu, RN

## 2020-02-21 ENCOUNTER — Encounter: Payer: BLUE CROSS/BLUE SHIELD | Admitting: Certified Nurse Midwife

## 2020-02-22 ENCOUNTER — Other Ambulatory Visit: Payer: Self-pay | Admitting: *Deleted

## 2020-02-22 DIAGNOSIS — O210 Mild hyperemesis gravidarum: Secondary | ICD-10-CM

## 2020-02-22 MED ORDER — SCOPOLAMINE 1 MG/3DAYS TD PT72: 1.0000 | MEDICATED_PATCH | TRANSDERMAL | 2 refills | Status: AC

## 2020-02-22 NOTE — Progress Notes (Signed)
Patient called to request refill for scopolamine 1.5 mg patch for n/v. Verbal order given to refill patch by Raelyn Mora, CNM. Clovis Pu, RN

## 2020-02-26 ENCOUNTER — Telehealth: Payer: Self-pay | Admitting: *Deleted

## 2020-02-26 NOTE — Telephone Encounter (Signed)
-----   Message from Marti Sleigh, [place] sent at 02/26/2020  8:46 AM EDT ----- Regarding: Babyscripts Blood pressure is 137/96 and it was the same when she retook it.

## 2020-02-26 NOTE — Telephone Encounter (Signed)
Hi Tamika,  Thank you for this message. This patient can be started on BP medication at her NOB visit for chronic hypertension, but given that she is [redacted] weeks pregnant, she will not need to come to MAU for s/sx of preeclampsia at 11 weeks. If she would like to schedule a sooner appointment to be seen to have her blood pressure addressed, she could certainly be offered a sooner appointment, if one is available.  Thank you! Joni Reining

## 2020-02-26 NOTE — Telephone Encounter (Signed)
Received called from Babyscripts regarding patient's blood pressure. Called patient, verified DOB. Patient denied SOB, headaches, chest pain, dizziness, abdominal pain. Patient is still having n/v. Patient is using scopolamine patch and phenergan suppository for n/v. Advised patient is she start to having any of the symptoms listed above or worsening n/v to go to MAU.     Clovis Pu, RN

## 2020-02-27 NOTE — Telephone Encounter (Signed)
Babyscripts called regarding patient's blood pressure.     Clovis Pu, RN

## 2020-03-06 ENCOUNTER — Inpatient Hospital Stay (HOSPITAL_COMMUNITY): Admission: RE | Admit: 2020-03-06 | Payer: BLUE CROSS/BLUE SHIELD | Source: Ambulatory Visit

## 2020-03-06 ENCOUNTER — Encounter: Payer: Self-pay | Admitting: Women's Health

## 2020-03-06 ENCOUNTER — Ambulatory Visit (INDEPENDENT_AMBULATORY_CARE_PROVIDER_SITE_OTHER): Payer: BLUE CROSS/BLUE SHIELD | Admitting: Women's Health

## 2020-03-06 ENCOUNTER — Encounter: Payer: Self-pay | Admitting: General Practice

## 2020-03-06 ENCOUNTER — Other Ambulatory Visit: Payer: Self-pay

## 2020-03-06 ENCOUNTER — Other Ambulatory Visit: Payer: Self-pay | Admitting: Women's Health

## 2020-03-06 VITALS — BP 133/81 | HR 106 | Temp 98.4°F | Wt 181.6 lb

## 2020-03-06 DIAGNOSIS — Z3480 Encounter for supervision of other normal pregnancy, unspecified trimester: Secondary | ICD-10-CM | POA: Diagnosis not present

## 2020-03-06 DIAGNOSIS — Z87898 Personal history of other specified conditions: Secondary | ICD-10-CM

## 2020-03-06 DIAGNOSIS — O99351 Diseases of the nervous system complicating pregnancy, first trimester: Secondary | ICD-10-CM

## 2020-03-06 DIAGNOSIS — O10919 Unspecified pre-existing hypertension complicating pregnancy, unspecified trimester: Secondary | ICD-10-CM | POA: Diagnosis not present

## 2020-03-06 DIAGNOSIS — Z3A12 12 weeks gestation of pregnancy: Secondary | ICD-10-CM | POA: Diagnosis not present

## 2020-03-06 DIAGNOSIS — G4089 Other seizures: Secondary | ICD-10-CM

## 2020-03-06 DIAGNOSIS — Z3490 Encounter for supervision of normal pregnancy, unspecified, unspecified trimester: Secondary | ICD-10-CM

## 2020-03-06 DIAGNOSIS — O210 Mild hyperemesis gravidarum: Secondary | ICD-10-CM | POA: Diagnosis not present

## 2020-03-06 MED ORDER — SCOPOLAMINE 1 MG/3DAYS TD PT72: 1.0000 | MEDICATED_PATCH | TRANSDERMAL | 1 refills | Status: AC

## 2020-03-06 MED ORDER — FOLIC ACID 1 MG PO TABS: 1.0000 mg | ORAL_TABLET | Freq: Every day | ORAL | 6 refills | Status: DC

## 2020-03-06 NOTE — Patient Instructions (Addendum)
Safe Medications in Pregnancy    Acne: Benzoyl Peroxide Salicylic Acid  Backache/Headache: Tylenol: 2 regular strength every 4 hours OR              2 Extra strength every 6 hours  Colds/Coughs/Allergies: Benadryl (alcohol free) 25 mg every 6 hours as needed Breath right strips Claritin Cepacol throat lozenges Chloraseptic throat spray Cold-Eeze- up to three times per day Cough drops, alcohol free Flonase (by prescription only) Guaifenesin Mucinex Robitussin DM (plain only, alcohol free) Saline nasal spray/drops Sudafed (pseudoephedrine) & Actifed ** use only after [redacted] weeks gestation and if you do not have high blood pressure Tylenol Vicks Vaporub Zinc lozenges Zyrtec   Constipation: Colace Ducolax suppositories Fleet enema Glycerin suppositories Metamucil Milk of magnesia Miralax Senokot Smooth move tea  Diarrhea: Kaopectate Imodium A-D  *NO pepto Bismol  Hemorrhoids: Anusol Anusol HC Preparation H Tucks  Indigestion: Tums Maalox Mylanta Zantac  Pepcid  Insomnia: Benadryl (alcohol free) 25mg  every 6 hours as needed Tylenol PM Unisom, no Gelcaps  Leg Cramps: Tums MagGel  Nausea/Vomiting:  Bonine Dramamine Emetrol Ginger extract Sea bands Meclizine  Nausea medication to take during pregnancy:  Unisom (doxylamine succinate 25 mg tablets) Take one tablet daily at bedtime. If symptoms are not adequately controlled, the dose can be increased to a maximum recommended dose of two tablets daily (1/2 tablet in the morning, 1/2 tablet mid-afternoon and one at bedtime). Vitamin B6 100mg  tablets. Take one tablet twice a day (up to 200 mg per day).  Skin Rashes: Aveeno products Benadryl cream or 25mg  every 6 hours as needed Calamine Lotion 1% cortisone cream  Yeast infection: Gyne-lotrimin 7 Monistat 7   **If taking multiple medications, please check labels to avoid duplicating the same active ingredients **take  medication as directed on the label ** Do not exceed 4000 mg of tylenol in 24 hours **Do not take medications that contain aspirin or ibuprofen      Maternity Assessment Unit (MAU)  The Maternity Assessment Unit (MAU) is located at the [facility]. The address is: [address], [place], [place], [address] [place]. Please see map below for additional directions.    The Maternity Assessment Unit is designed to help you during your pregnancy, and for up to 6 weeks after delivery, with any pregnancy- or postpartum-related emergencies, if you think you are in labor, or if your water has broken. For example, if you experience nausea and vomiting, vaginal bleeding, severe abdominal or pelvic pain, elevated blood pressure or other problems related to your pregnancy or postpartum time, please come to the Maternity Assessment Unit for assistance.   AREA PEDIATRIC/FAMILY PRACTICE PHYSICIANS  [facility] [address] [place], [address] [address] Phone - [phone]   Fax - [phone]  JACK AMOS [address] [place], [phone]  [address] Phone - [phone]   Fax - [phone]  [facility] [address] [place], [facility]  [address] Phone - [phone]   Fax - [phone]  [facility] [address] [place], [facility]  [address] [place] Phone - [phone]   Fax - [phone]  [facility] [address], [address] [place], [facility]  [address] Phone - [phone]   Fax - [phone]  [facility] [address] [phone] [place], [address]  [address] Phone - [phone]   Fax - [phone]  [facility] [address], [address] [place],  [place]  [address] Phone - [phone]   Fax - [phone]  [facility] AT [facility] [address] [place], [address] [place], [place]  [address] Phone - [phone]   Fax -  [phone]  [facility] AT [facility] [address] [place], [place]  [address] Phone - [phone]   Fax - [phone] [facility] AT [place] [address] [place], [place]  [address] Phone - [phone]   Fax - [phone]  [facility] [address] [place], [place]  [address] Phone - [phone]   Fax - [phone]  [facility] [address] [place], [place]  [address] Phone - [phone]   Fax - [phone]  [facility] [place] [address] [place], [place]  [address] Phone - [phone]   Fax - [phone]  [facility] [address] [place], [place]  [address] Phone - [phone]  [facility] [address] [place], [place]  [address] Phone - [phone]   Fax - [phone]  [facility] [address] [place], [place]  [address] Phone - [phone]   Fax - [phone]  [facility] [address] [place], [place]  [address] Phone - [phone]   Fax - [phone]  [facility] [address] [place], [place]  [address] Phone - [phone]   Fax - [phone]   [facility] [address] [place] [address] [place], [place]  [address] Phone - [phone]   Fax - [phone]  [facility] [address] [place], [place]  [address] Phone - [phone]   Fax - [phone]  DAVID RUBIN [address] [place], [place]  [address] Phone - [phone]   Fax - [phone]  [facility] [address] [place], [place]  [address] Phone - [phone]   Fax - [phone]  [place] [facility] Al[facility] Chyle [address] [place], [place]  [address] Phone - [phone]   Fax - [phone] Gerarda Fraction [address] [place], [place]  [address] Phone - [phone]   Fax -  [phone]  [facility] [address] [place], [place]  [address] Phone - [phone]   Fax - [phone]  [facility] -  [address], [address] [place], [place]  [address] Phone - [phone]   Fax - [phone]       Hyperemesis Gravidarum Hyperemesis gravidarum is a severe form of nausea and vomiting that happens during pregnancy. Hyperemesis is worse than morning sickness. It may cause you to have nausea or vomiting all day for many days. It may keep you from eating and drinking enough food and liquids, which can lead to dehydration, malnutrition, and weight loss. Hyperemesis usually occurs during the first half (the first 20 weeks) of pregnancy. It often goes away once a woman is in her second half of pregnancy. However, sometimes hyperemesis continues through an entire pregnancy. What are the causes? The cause of this condition is not known. It may be related to changes in chemicals (hormones) in the body during pregnancy, such as the high level of pregnancy hormone (human chorionic gonadotropin) or the increase in the female sex hormone (estrogen). What are the signs or symptoms? Symptoms of this condition include:  Nausea that does not go away.  Vomiting that does not allow you to keep any food down.  Weight loss.  Body fluid loss (dehydration).  Having no desire to eat, or not liking food that you have previously enjoyed. How is this diagnosed? This condition may be diagnosed based on:  A physical exam.  Your medical history.  Your symptoms.  Blood tests.  Urine tests. How is this treated? This condition is managed by controlling symptoms. This may include:  Following an eating plan. This can help lessen nausea and vomiting.  Taking prescription medicines. An eating plan and medicines are often used together to help control symptoms. If medicines do not help relieve nausea and vomiting, you may need to  receive fluids through an IV at the hospital. Follow these instructions at home: Eating and drinking   Avoid the following: ? Drinking fluids with meals. Try not to drink anything during the 30 minutes before and after your meals. ? Drinking more than 1 cup of fluid at a time. ? Eating foods that trigger your symptoms. These may include spicy foods, coffee, high-fat foods, very sweet foods, and acidic foods. ? Skipping meals. Nausea can be more intense on an empty stomach. If you cannot tolerate food, do not force it. Try sucking on ice chips or other frozen items and make up for missed calories later. ? Lying down within 2 hours after eating. ? Being exposed to environmental triggers. These may include food smells, smoky rooms, closed spaces, rooms with strong smells, warm or humid places, overly loud and noisy rooms, and rooms with motion or flickering lights. Try eating meals in a well-ventilated area that is free of strong smells. ? Quick and sudden changes in your movement. ? Taking iron pills and multivitamins that contain iron. If you take prescription iron pills, do not stop taking them unless your health care provider approves. ? Preparing food. The smell of food can spoil your appetite or trigger nausea.  To help relieve your symptoms: ? Listen to your body. Everyone is different and has different preferences. Find what works best for you. ? Eat and drink slowly. ? Eat 5-6 small meals daily instead of 3 large meals. Eating small meals and snacks can help you avoid an empty stomach. ? In the morning, before getting out of bed, eat a couple of crackers to avoid moving around on an empty stomach. ? Try eating starchy foods as these are usually tolerated well. Examples include cereal, toast, bread, potatoes, pasta, rice, and pretzels. ? Include at least 1 serving of protein with your meals and snacks. Protein options include lean meats, poultry, seafood, beans, nuts, nut butters, eggs,  cheese, and yogurt. ? Try eating a protein-rich snack before bed. Examples of a protein-rick snack include cheese and crackers or a peanut butter sandwich made with 1 slice of whole-wheat bread and 1 tsp (5 g) of peanut butter. ? Eat or suck on things that have ginger in them. It may help relieve nausea. Add  tsp ground ginger to hot tea or choose ginger tea. ? Try drinking 100% fruit juice or an electrolyte drink. An electrolyte drink contains sodium, potassium, and chloride. ? Drink fluids that are cold, clear, and carbonated or sour. Examples include lemonade, ginger ale, lemon-lime soda, ice water, and sparkling water. ? Brush your teeth or use a mouth rinse after meals. ? Talk with your health care provider about starting a supplement of vitamin B6. General instructions  Take over-the-counter and prescription medicines only as told by your health care provider.  Follow instructions from your health care provider about eating or drinking  restrictions.  Continue to take your prenatal vitamins as told by your health care provider. If you are having trouble taking your prenatal vitamins, talk with your health care provider about different options.  Keep all follow-up and pre-birth (prenatal) visits as told by your health care provider. This is important. Contact a health care provider if:  You have pain in your abdomen.  You have a severe headache.  You have vision problems.  You are losing weight.  You feel weak or dizzy. Get help right away if:  You cannot drink fluids without vomiting.  You vomit blood.  You have constant nausea and vomiting.  You are very weak.  You faint.  You have a fever and your symptoms suddenly get worse. Summary  Hyperemesis gravidarum is a severe form of nausea and vomiting that happens during pregnancy.  Making some changes to your eating habits may help relieve nausea and vomiting.  This condition may be managed with medicine.  If  medicines do not help relieve nausea and vomiting, you may need to receive fluids through an IV at the hospital. This information is not intended to replace advice given to you by your health care provider. Make sure you discuss any questions you have with your health care provider. Document Revised: 12/06/2017 Document Reviewed: 07/15/2016 Elsevier Patient Education  2020 Elsevier Inc.     Morning Sickness  Morning sickness is when a woman feels nauseous during pregnancy. This nauseous feeling may or may not come with vomiting. It often occurs in the morning, but it can be a problem at any time of day. Morning sickness is most common during the first trimester. In some cases, it may continue throughout pregnancy. Although morning sickness is unpleasant, it is usually harmless unless the woman develops severe and continual vomiting (hyperemesis gravidarum), a condition that requires more intense treatment. What are the causes? The exact cause of this condition is not known, but it seems to be related to normal hormonal changes that occur in pregnancy. What increases the risk? You are more likely to develop this condition if:  You experienced nausea or vomiting before your pregnancy.  You had morning sickness during a previous pregnancy.  You are pregnant with more than one baby, such as twins. What are the signs or symptoms? Symptoms of this condition include:  Nausea.  Vomiting. How is this diagnosed? This condition is usually diagnosed based on your signs and symptoms. How is this treated? In many cases, treatment is not needed for this condition. Making some changes to what you eat may help to control symptoms. Your health care provider may also prescribe or recommend:  Vitamin B6 supplements.  Anti-nausea medicines.  Ginger. Follow these instructions at home: Medicines  Take over-the-counter and prescription medicines only as told by your health care provider. Do not use  any prescription, over-the-counter, or herbal medicines for morning sickness without first talking with your health care provider.  Taking multivitamins before getting pregnant can prevent or decrease the severity of morning sickness in most women. Eating and drinking  Eat a piece of dry toast or crackers before getting out of bed in the morning.  Eat 5 or 6 small meals a day.  Eat dry and bland foods, such as rice or a baked potato. Foods that are high in carbohydrates are often helpful.  Avoid greasy, fatty, and spicy foods.  Have someone cook for you if the smell of any food causes nausea and vomiting.  If you feel nauseous  after taking prenatal vitamins, take the vitamins at night or with a snack.  Snack on protein foods between meals if you are hungry. Nuts, yogurt, and cheese are good options.  Drink fluids throughout the day.  Try ginger ale made with real ginger, ginger tea made from fresh grated ginger, or ginger candies. General instructions  Do not use any products that contain nicotine or tobacco, such as cigarettes and e-cigarettes. If you need help quitting, ask your health care provider.  Get an air purifier to keep the air in your house free of odors.  Get plenty of fresh air.  Try to avoid odors that trigger your nausea.  Consider trying these methods to help relieve symptoms: ? Wearing an acupressure wristband. These wristbands are often worn for seasickness. ? Acupuncture. Contact a health care provider if:  Your home remedies are not working and you need medicine.  You feel dizzy or light-headed.  You are losing weight. Get help right away if:  You have persistent and uncontrolled nausea and vomiting.  You faint.  You have severe pain in your abdomen. Summary  Morning sickness is when a woman feels nauseous during pregnancy. This nauseous feeling may or may not come with vomiting.  Morning sickness is most common during the first  trimester.  It often occurs in the morning, but it can be a problem at any time of day.  In many cases, treatment is not needed for this condition. Making some changes to what you eat may help to control symptoms. This information is not intended to replace advice given to you by your health care provider. Make sure you discuss any questions you have with your health care provider. Document Revised: 10/29/2017 Document Reviewed: 12/19/2016 Elsevier Patient Education  2020 Elsevier Inc.     Round Ligament Pain  The round ligament is a cord of muscle and tissue that helps support the uterus. It can become a source of pain during pregnancy if it becomes stretched or twisted as the baby grows. The pain usually begins in the second trimester (13-28 weeks) of pregnancy, and it can come and go until the baby is delivered. It is not a serious problem, and it does not cause harm to the baby. Round ligament pain is usually a short, sharp, and pinching pain, but it can also be a dull, lingering, and aching pain. The pain is felt in the lower side of the abdomen or in the groin. It usually starts deep in the groin and moves up to the outside of the hip area. The pain may occur when you:  Suddenly change position, such as quickly going from a sitting to standing position.  Roll over in bed.  Cough or sneeze.  Do physical activity. Follow these instructions at home:   Watch your condition for any changes.  When the pain starts, relax. Then try any of these methods to help with the pain: ? Sitting down. ? Flexing your knees up to your abdomen. ? Lying on your side with one pillow under your abdomen and another pillow between your legs. ? Sitting in a warm bath for 15-20 minutes or until the pain goes away.  Take over-the-counter and prescription medicines only as told by your health care provider.  Move slowly when you sit down or stand up.  Avoid long walks if they cause pain.  Stop or  reduce your physical activities if they cause pain.  Keep all follow-up visits as told by your health care  provider. This is important. Contact a health care provider if:  Your pain does not go away with treatment.  You feel pain in your back that you did not have before.  Your medicine is not helping. Get help right away if:  You have a fever or chills.  You develop uterine contractions.  You have vaginal bleeding.  You have nausea or vomiting.  You have diarrhea.  You have pain when you urinate. Summary  Round ligament pain is felt in the lower abdomen or groin. It is usually a short, sharp, and pinching pain. It can also be a dull, lingering, and aching pain.  This pain usually begins in the second trimester (13-28 weeks). It occurs because the uterus is stretching with the growing baby, and it is not harmful to the baby.  You may notice the pain when you suddenly change position, when you cough or sneeze, or during physical activity.  Relaxing, flexing your knees to your abdomen, lying on one side, or taking a warm bath may help to get rid of the pain.  Get help from your health care provider if the pain does not go away or if you have vaginal bleeding, nausea, vomiting, diarrhea, or painful urination. This information is not intended to replace advice given to you by your health care provider. Make sure you discuss any questions you have with your health care provider. Document Revised: 05/04/2018 Document Reviewed: 05/04/2018 Elsevier Patient Education  [place] of Pregnancy The second trimester is from week 14 through week 27 (months 4 through 6). The second trimester is often a time when you feel your best. Your body has adjusted to being pregnant, and you begin to feel better physically. Usually, morning sickness has lessened or quit completely, you may have more energy, and you may have an increase in appetite. The second trimester is also  a time when the fetus is growing rapidly. At the end of the sixth month, the fetus is about 9 inches long and weighs about 1 pounds. You will likely begin to feel the baby move (quickening) between 16 and 20 weeks of pregnancy. Body changes during your second trimester Your body continues to go through many changes during your second trimester. The changes vary from woman to woman.  Your weight will continue to increase. You will notice your lower abdomen bulging out.  You may begin to get stretch marks on your hips, abdomen, and breasts.  You may develop headaches that can be relieved by medicines. The medicines should be approved by your health care provider.  You may urinate more often because the fetus is pressing on your bladder.  You may develop or continue to have heartburn as a result of your pregnancy.  You may develop constipation because certain hormones are causing the muscles that push waste through your intestines to slow down.  You may develop hemorrhoids or swollen, bulging veins (varicose veins).  You may have back pain. This is caused by: ? Weight gain. ? Pregnancy hormones that are relaxing the joints in your pelvis. ? A shift in weight and the muscles that support your balance.  Your breasts will continue to grow and they will continue to become tender.  Your gums may bleed and may be sensitive to brushing and flossing.  Dark spots or blotches (chloasma, mask of pregnancy) may develop on your face. This will likely fade after the baby is born.  A dark line from your belly button to  the pubic area (linea nigra) may appear. This will likely fade after the baby is born.  You may have changes in your hair. These can include thickening of your hair, rapid growth, and changes in texture. Some women also have hair loss during or after pregnancy, or hair that feels dry or thin. Your hair will most likely return to normal after your baby is born. What to expect at prenatal  visits During a routine prenatal visit:  You will be weighed to make sure you and the fetus are growing normally.  Your blood pressure will be taken.  Your abdomen will be measured to track your baby's growth.  The fetal heartbeat will be listened to.  Any test results from the previous visit will be discussed. Your health care provider may ask you:  How you are feeling.  If you are feeling the baby move.  If you have had any abnormal symptoms, such as leaking fluid, bleeding, severe headaches, or abdominal cramping.  If you are using any tobacco products, including cigarettes, chewing tobacco, and electronic cigarettes.  If you have any questions. Other tests that may be performed during your second trimester include:  Blood tests that check for: ? Low iron levels (anemia). ? High blood sugar that affects pregnant women (gestational diabetes) between 5524 and 28 weeks. ? Rh antibodies. This is to check for a protein on red blood cells (Rh factor).  Urine tests to check for infections, diabetes, or protein in the urine.  An ultrasound to confirm the proper growth and development of the baby.  An amniocentesis to check for possible genetic problems.  Fetal screens for spina bifida and Down syndrome.  HIV (human immunodeficiency virus) testing. Routine prenatal testing includes screening for HIV, unless you choose not to have this test. Follow these instructions at home: Medicines  Follow your health care provider's instructions regarding medicine use. Specific medicines may be either safe or unsafe to take during pregnancy.  Take a prenatal vitamin that contains at least 600 micrograms (mcg) of folic acid.  If you develop constipation, try taking a stool softener if your health care provider approves. Eating and drinking   Eat a balanced diet that includes fresh fruits and vegetables, whole grains, good sources of protein such as meat, eggs, or tofu, and low-fat dairy.  Your health care provider will help you determine the amount of weight gain that is right for you.  Avoid raw meat and uncooked cheese. These carry germs that can cause birth defects in the baby.  If you have low calcium intake from food, talk to your health care provider about whether you should take a daily calcium supplement.  Limit foods that are high in fat and processed sugars, such as fried and sweet foods.  To prevent constipation: ? Drink enough fluid to keep your urine clear or pale yellow. ? Eat foods that are high in fiber, such as fresh fruits and vegetables, whole grains, and beans. Activity  Exercise only as directed by your health care provider. Most women can continue their usual exercise routine during pregnancy. Try to exercise for 30 minutes at least 5 days a week. Stop exercising if you experience uterine contractions.  Avoid heavy lifting, wear low heel shoes, and practice good posture.  A sexual relationship may be continued unless your health care provider directs you otherwise. Relieving pain and discomfort  Wear a good support bra to prevent discomfort from breast tenderness.  Take warm sitz baths to soothe any  pain or discomfort caused by hemorrhoids. Use hemorrhoid cream if your health care provider approves.  Rest with your legs elevated if you have leg cramps or low back pain.  If you develop varicose veins, wear support hose. Elevate your feet for 15 minutes, 3-4 times a day. Limit salt in your diet. Prenatal Care  Write down your questions. Take them to your prenatal visits.  Keep all your prenatal visits as told by your health care provider. This is important. Safety  Wear your seat belt at all times when driving.  Make a list of emergency phone numbers, including numbers for family, friends, the hospital, and police and fire departments. General instructions  Ask your health care provider for a referral to a local prenatal education class.  Begin classes no later than the beginning of month 6 of your pregnancy.  Ask for help if you have counseling or nutritional needs during pregnancy. Your health care provider can offer advice or refer you to specialists for help with various needs.  Do not use hot tubs, steam rooms, or saunas.  Do not douche or use tampons or scented sanitary pads.  Do not cross your legs for long periods of time.  Avoid cat litter boxes and soil used by cats. These carry germs that can cause birth defects in the baby and possibly loss of the fetus by miscarriage or stillbirth.  Avoid all smoking, herbs, alcohol, and unprescribed drugs. Chemicals in these products can affect the formation and growth of the baby.  Do not use any products that contain nicotine or tobacco, such as cigarettes and e-cigarettes. If you need help quitting, ask your health care provider.  Visit your dentist if you have not gone yet during your pregnancy. Use a soft toothbrush to brush your teeth and be gentle when you floss. Contact a health care provider if:  You have dizziness.  You have mild pelvic cramps, pelvic pressure, or nagging pain in the abdominal area.  You have persistent nausea, vomiting, or diarrhea.  You have a bad smelling vaginal discharge.  You have pain when you urinate. Get help right away if:  You have a fever.  You are leaking fluid from your vagina.  You have spotting or bleeding from your vagina.  You have severe abdominal cramping or pain.  You have rapid weight gain or weight loss.  You have shortness of breath with chest pain.  You notice sudden or extreme swelling of your face, hands, ankles, feet, or legs.  You have not felt your baby move in over an hour.  You have severe headaches that do not go away when you take medicine.  You have vision changes. Summary  The second trimester is from week 14 through week 27 (months 4 through 6). It is also a time when the fetus is growing  rapidly.  Your body goes through many changes during pregnancy. The changes vary from woman to woman.  Avoid all smoking, herbs, alcohol, and unprescribed drugs. These chemicals affect the formation and growth your baby.  Do not use any tobacco products, such as cigarettes, chewing tobacco, and e-cigarettes. If you need help quitting, ask your health care provider.  Contact your health care provider if you have any questions. Keep all prenatal visits as told by your health care provider. This is important. This information is not intended to replace advice given to you by your health care provider. Make sure you discuss any questions you have with your health care provider.  Document Revised: 03/10/2019 Document Reviewed: 12/22/2016 Elsevier Patient Education  2020 [employer].  First Trimester of Pregnancy The first trimester of pregnancy is from week 1 until the end of week 13 (months 1 through 3). A week after a sperm fertilizes an egg, the egg will implant on the wall of the uterus. This embryo will begin to develop into a baby. Genes from you and your partner will form the baby. The female genes will determine whether the baby will be a boy or a girl. At 6-8 weeks, the eyes and face will be formed, and the heartbeat can be seen on ultrasound. At the end of 12 weeks, all the baby's organs will be formed. Now that you are pregnant, you will want to do everything you can to have a healthy baby. Two of the most important things are to get good prenatal care and to follow your health care provider's instructions. Prenatal care is all the medical care you receive before the baby's birth. This care will help prevent, find, and treat any problems during the pregnancy and childbirth. Body changes during your first trimester Your body goes through many changes during pregnancy. The changes vary from woman to woman.  You may gain or lose a couple of pounds at first.  You may feel sick to your stomach  (nauseous) and you may throw up (vomit). If the vomiting is uncontrollable, call your health care provider.  You may tire easily.  You may develop headaches that can be relieved by medicines. All medicines should be approved by your health care provider.  You may urinate more often. Painful urination may mean you have a bladder infection.  You may develop heartburn as a result of your pregnancy.  You may develop constipation because certain hormones are causing the muscles that push stool through your intestines to slow down.  You may develop hemorrhoids or swollen veins (varicose veins).  Your breasts may begin to grow larger and become tender. Your nipples may stick out more, and the tissue that surrounds them (areola) may become darker.  Your gums may bleed and may be sensitive to brushing and flossing.  Dark spots or blotches (chloasma, mask of pregnancy) may develop on your face. This will likely fade after the baby is born.  Your menstrual periods will stop.  You may have a loss of appetite.  You may develop cravings for certain kinds of food.  You may have changes in your emotions from day to day, such as being excited to be pregnant or being concerned that something may go wrong with the pregnancy and baby.  You may have more vivid and strange dreams.  You may have changes in your hair. These can include thickening of your hair, rapid growth, and changes in texture. Some women also have hair loss during or after pregnancy, or hair that feels dry or thin. Your hair will most likely return to normal after your baby is born. What to expect at prenatal visits During a routine prenatal visit:  You will be weighed to make sure you and the baby are growing normally.  Your blood pressure will be taken.  Your abdomen will be measured to track your baby's growth.  The fetal heartbeat will be listened to between weeks 10 and 14 of your pregnancy.  Test results from any previous  visits will be discussed. Your health care provider may ask you:  How you are feeling.  If you are feeling the baby move.  If  you have had any abnormal symptoms, such as leaking fluid, bleeding, severe headaches, or abdominal cramping.  If you are using any tobacco products, including cigarettes, chewing tobacco, and electronic cigarettes.  If you have any questions. Other tests that may be performed during your first trimester include:  Blood tests to find your blood type and to check for the presence of any previous infections. The tests will also be used to check for low iron levels (anemia) and protein on red blood cells (Rh antibodies). Depending on your risk factors, or if you previously had diabetes during pregnancy, you may have tests to check for high blood sugar that affects pregnant women (gestational diabetes).  Urine tests to check for infections, diabetes, or protein in the urine.  An ultrasound to confirm the proper growth and development of the baby.  Fetal screens for spinal cord problems (spina bifida) and Down syndrome.  HIV (human immunodeficiency virus) testing. Routine prenatal testing includes screening for HIV, unless you choose not to have this test.  You may need other tests to make sure you and the baby are doing well. Follow these instructions at home: Medicines  Follow your health care provider's instructions regarding medicine use. Specific medicines may be either safe or unsafe to take during pregnancy.  Take a prenatal vitamin that contains at least 600 micrograms (mcg) of folic acid.  If you develop constipation, try taking a stool softener if your health care provider approves. Eating and drinking   Eat a balanced diet that includes fresh fruits and vegetables, whole grains, good sources of protein such as meat, eggs, or tofu, and low-fat dairy. Your health care provider will help you determine the amount of weight gain that is right for  you.  Avoid raw meat and uncooked cheese. These carry germs that can cause birth defects in the baby.  Eating four or five small meals rather than three large meals a day may help relieve nausea and vomiting. If you start to feel nauseous, eating a few soda crackers can be helpful. Drinking liquids between meals, instead of during meals, also seems to help ease nausea and vomiting.  Limit foods that are high in fat and processed sugars, such as fried and sweet foods.  To prevent constipation: ? Eat foods that are high in fiber, such as fresh fruits and vegetables, whole grains, and beans. ? Drink enough fluid to keep your urine clear or pale yellow. Activity  Exercise only as directed by your health care provider. Most women can continue their usual exercise routine during pregnancy. Try to exercise for 30 minutes at least 5 days a week. Exercising will help you: ? Control your weight. ? Stay in shape. ? Be prepared for labor and delivery.  Experiencing pain or cramping in the lower abdomen or lower back is a good sign that you should stop exercising. Check with your health care provider before continuing with normal exercises.  Try to avoid standing for long periods of time. Move your legs often if you must stand in one place for a long time.  Avoid heavy lifting.  Wear low-heeled shoes and practice good posture.  You may continue to have sex unless your health care provider tells you not to. Relieving pain and discomfort  Wear a good support bra to relieve breast tenderness.  Take warm sitz baths to soothe any pain or discomfort caused by hemorrhoids. Use hemorrhoid cream if your health care provider approves.  Rest with your legs elevated if you  have leg cramps or low back pain.  If you develop varicose veins in your legs, wear support hose. Elevate your feet for 15 minutes, 3-4 times a day. Limit salt in your diet. Prenatal care  Schedule your prenatal visits by the twelfth  week of pregnancy. They are usually scheduled monthly at first, then more often in the last 2 months before delivery.  Write down your questions. Take them to your prenatal visits.  Keep all your prenatal visits as told by your health care provider. This is important. Safety  Wear your seat belt at all times when driving.  Make a list of emergency phone numbers, including numbers for family, friends, the hospital, and police and fire departments. General instructions  Ask your health care provider for a referral to a local prenatal education class. Begin classes no later than the beginning of month 6 of your pregnancy.  Ask for help if you have counseling or nutritional needs during pregnancy. Your health care provider can offer advice or refer you to specialists for help with various needs.  Do not use hot tubs, steam rooms, or saunas.  Do not douche or use tampons or scented sanitary pads.  Do not cross your legs for long periods of time.  Avoid cat litter boxes and soil used by cats. These carry germs that can cause birth defects in the baby and possibly loss of the fetus by miscarriage or stillbirth.  Avoid all smoking, herbs, alcohol, and medicines not prescribed by your health care provider. Chemicals in these products affect the formation and growth of the baby.  Do not use any products that contain nicotine or tobacco, such as cigarettes and e-cigarettes. If you need help quitting, ask your health care provider. You may receive counseling support and other resources to help you quit.  Schedule a dentist appointment. At home, brush your teeth with a soft toothbrush and be gentle when you floss. Contact a health care provider if:  You have dizziness.  You have mild pelvic cramps, pelvic pressure, or nagging pain in the abdominal area.  You have persistent nausea, vomiting, or diarrhea.  You have a bad smelling vaginal discharge.  You have pain when you urinate.  You  notice increased swelling in your face, hands, legs, or ankles.  You are exposed to fifth disease or chickenpox.  You are exposed to [place] measles (rubella) and have never had it. Get help right away if:  You have a fever.  You are leaking fluid from your vagina.  You have spotting or bleeding from your vagina.  You have severe abdominal cramping or pain.  You have rapid weight gain or loss.  You vomit blood or material that looks like coffee grounds.  You develop a severe headache.  You have shortness of breath.  You have any kind of trauma, such as from a fall or a car accident. Summary  The first trimester of pregnancy is from week 1 until the end of week 13 (months 1 through 3).  Your body goes through many changes during pregnancy. The changes vary from woman to woman.  You will have routine prenatal visits. During those visits, your health care provider will examine you, discuss any test results you may have, and talk with you about how you are feeling. This information is not intended to replace advice given to you by your health care provider. Make sure you discuss any questions you have with your health care provider. Document Revised: 10/29/2017 Document Reviewed:  10/28/2016 Elsevier Patient Education  2020 [employer]       Preeclampsia and Eclampsia Preeclampsia is a serious condition that may develop during pregnancy. This condition causes high blood pressure and increased protein in your urine along with other symptoms, such as headaches and vision changes. These symptoms may develop as the condition gets worse. Preeclampsia may occur at 20 weeks of pregnancy or later. Diagnosing and treating preeclampsia early is very important. If not treated early, it can cause serious problems for you and your baby. One problem it can lead to is eclampsia. Eclampsia is a condition that causes muscle jerking or shaking (convulsions or seizures) and other serious problems  for the mother. During pregnancy, delivering your baby may be the best treatment for preeclampsia or eclampsia. For most women, preeclampsia and eclampsia symptoms go away after giving birth. In rare cases, a woman may develop preeclampsia after giving birth (postpartum preeclampsia). This usually occurs within 48 hours after childbirth but may occur up to 6 weeks after giving birth. What are the causes? The cause of preeclampsia is not known. What increases the risk? The following risk factors make you more likely to develop preeclampsia:  Being pregnant for the first time.  Having had preeclampsia during a past pregnancy.  Having a family history of preeclampsia.  Having high blood pressure.  Being pregnant with more than one baby.  Being 48 or older.  Being African-American.  Having kidney disease or diabetes.  Having medical conditions such as lupus or blood diseases.  Being very overweight (obese). What are the signs or symptoms? The most common symptoms are:  Severe headaches.  Vision problems, such as blurred or double vision.  Abdominal pain, especially upper abdominal pain. Other symptoms that may develop as the condition gets worse include:  Sudden weight gain.  Sudden swelling of the hands, face, legs, and feet.  Severe nausea and vomiting.  Numbness in the face, arms, legs, and feet.  Dizziness.  Urinating less than usual.  Slurred speech.  Convulsions or seizures. How is this diagnosed? There are no screening tests for preeclampsia. Your health care provider will ask you about symptoms and check for signs of preeclampsia during your prenatal visits. You may also have tests that include:  Checking your blood pressure.  Urine tests to check for protein. Your health care provider will check for this at every prenatal visit.  Blood tests.  Monitoring your baby's heart rate.  Ultrasound. How is this treated? You and your health care provider  will determine the treatment approach that is best for you. Treatment may include:  Having more frequent prenatal exams to check for signs of preeclampsia, if you have an increased risk for preeclampsia.  Medicine to lower your blood pressure.  Staying in the hospital, if your condition is severe. There, treatment will focus on controlling your blood pressure and the amount of fluids in your body (fluid retention).  Taking medicine (magnesium sulfate) to prevent seizures. This may be given as an injection or through an IV.  Taking a low-dose aspirin during your pregnancy.  Delivering your baby early. You may have your labor started with medicine (induced), or you may have a cesarean delivery. Follow these instructions at home: Eating and drinking   Drink enough fluid to keep your urine pale yellow.  Avoid caffeine. Lifestyle  Do not use any products that contain nicotine or tobacco, such as cigarettes and e-cigarettes. If you need help quitting, ask your health care provider.  Do  not use alcohol or drugs.  Avoid stress as much as possible. Rest and get plenty of sleep. General instructions  Take over-the-counter and prescription medicines only as told by your health care provider.  When lying down, lie on your left side. This keeps pressure off your major blood vessels.  When sitting or lying down, raise (elevate) your feet. Try putting some pillows underneath your lower legs.  Exercise regularly. Ask your health care provider what kinds of exercise are best for you.  Keep all follow-up and prenatal visits as told by your health care provider. This is important. How is this prevented? There is no known way of preventing preeclampsia or eclampsia from developing. However, to lower your risk of complications and detect problems early:  Get regular prenatal care. Your health care provider may be able to diagnose and treat the condition early.  Maintain a healthy weight. Ask  your health care provider for help managing weight gain during pregnancy.  Work with your health care provider to manage any long-term (chronic) health conditions you have, such as diabetes or kidney problems.  You may have tests of your blood pressure and kidney function after giving birth.  Your health care provider may have you take low-dose aspirin during your next pregnancy. Contact a health care provider if:  You have symptoms that your health care provider told you may require more treatment or monitoring, such as: ? Headaches. ? Nausea or vomiting. ? Abdominal pain. ? Dizziness. ? Light-headedness. Get help right away if:  You have severe: ? Abdominal pain. ? Headaches that do not get better. ? Dizziness. ? Vision problems. ? Confusion. ? Nausea or vomiting.  You have any of the following: ? A seizure. ? Sudden, rapid weight gain. ? Sudden swelling in your hands, ankles, or face. ? Trouble moving any part of your body. ? Numbness in any part of your body. ? Trouble speaking. ? Abnormal bleeding.  You faint. Summary  Preeclampsia is a serious condition that may develop during pregnancy.  This condition causes high blood pressure and increased protein in your urine along with other symptoms, such as headaches and vision changes.  Diagnosing and treating preeclampsia early is very important. If not treated early, it can cause serious problems for you and your baby.  Get help right away if you have symptoms that your health care provider told you to watch for. This information is not intended to replace advice given to you by your health care provider. Make sure you discuss any questions you have with your health care provider. Document Revised: 07/19/2018 Document Reviewed: 06/22/2016 Elsevier Patient Education  2020 [employer]  Hypertension During Pregnancy High blood pressure (hypertension) is when the force of blood pumping through the arteries is too  strong. Arteries are blood vessels that carry blood from the heart throughout the body. Hypertension during pregnancy can be mild or severe. Severe hypertension during pregnancy (preeclampsia) is a medical emergency that requires prompt evaluation and treatment. Different types of hypertension can happen during pregnancy. These include:  Chronic hypertension. This happens when you had high blood pressure before you became pregnant, and it continues during the pregnancy. Hypertension that develops before you are [redacted] weeks pregnant and continues during the pregnancy is also called chronic hypertension. If you have chronic hypertension, it will not go away after you have your baby. You will need follow-up visits with your health care provider after you have your baby. Your doctor may want you to keep taking  medicine for your blood pressure.  Gestational hypertension. This is hypertension that develops after the 20th week of pregnancy. Gestational hypertension usually goes away after you have your baby, but your health care provider will need to monitor your blood pressure to make sure that it is getting better.  Preeclampsia. This is severe hypertension during pregnancy. This can cause serious complications for you and your baby and can also cause complications for you after the delivery of your baby.  Postpartum preeclampsia. You may develop severe hypertension after giving birth. This usually occurs within 48 hours after childbirth but may occur up to 6 weeks after giving birth. This is rare. How does this affect me? Women who have hypertension during pregnancy have a greater chance of developing hypertension later in life or during future pregnancies. In some cases, hypertension during pregnancy can cause serious complications, such as:  Stroke.  Heart attack.  Injury to other organs, such as kidneys, lungs, or liver.  Preeclampsia.  Convulsions or seizures.  Placental abruption. How does this  affect my baby? Hypertension during pregnancy can affect your baby. Your baby may:  Be born early (prematurely).  Not weigh as much as he or she should at birth (low birth weight).  Not tolerate labor well, leading to an unplanned cesarean delivery. What are the risks? There are certain factors that make it more likely for you to develop hypertension during pregnancy. These include:  Having hypertension during a previous pregnancy.  Being overweight.  Being age 61 or older.  Being pregnant for the first time.  Being pregnant with more than one baby.  Becoming pregnant using fertilization methods, such as IVF (in vitro fertilization).  Having other medical problems, such as diabetes, kidney disease, or lupus.  Having a family history of hypertension. What can I do to lower my risk? The exact cause of hypertension during pregnancy is not known. You may be able to lower your risk by:  Maintaining a healthy weight.  Eating a healthy and balanced diet.  Following your health care provider's instructions about treating any long-term conditions that you had before becoming pregnant. It is very important to keep all of your prenatal care appointments. Your health care provider will check your blood pressure and make sure that your pregnancy is progressing as expected. If a problem is found, early treatment can prevent complications. How is this treated? Treatment for hypertension during pregnancy varies depending on the type of hypertension you have and how serious it is.  If you were taking medicine for high blood pressure before you became pregnant, talk with your health care provider. You may need to change medicine during pregnancy because some medicines, like ACE inhibitors, may not be considered safe for your baby.  If you have gestational hypertension, your health care provider may order medicine to treat this during pregnancy.  If you are at risk for preeclampsia, your  health care provider may recommend that you take a low-dose aspirin during your pregnancy.  If you have severe hypertension, you may need to be hospitalized so you and your baby can be monitored closely. You may also need to be given medicine to lower your blood pressure. This medicine may be given by mouth or through an IV.  In some cases, if your condition gets worse, you may need to deliver your baby early. Follow these instructions at home: Eating and drinking   Drink enough fluid to keep your urine pale yellow.  Avoid caffeine. Lifestyle  Do not use  any products that contain nicotine or tobacco, such as cigarettes, e-cigarettes, and chewing tobacco. If you need help quitting, ask your health care provider.  Do not use alcohol or drugs.  Avoid stress as much as possible.  Rest and get plenty of sleep.  Regular exercise can help to reduce your blood pressure. Ask your health care provider what kinds of exercise are best for you. General instructions  Take over-the-counter and prescription medicines only as told by your health care provider.  Keep all prenatal and follow-up visits as told by your health care provider. This is important. Contact a health care provider if:  You have symptoms that your health care provider told you may require more treatment or monitoring, such as: ? Headaches. ? Nausea or vomiting. ? Abdominal pain. ? Dizziness. ? Light-headedness. Get help right away if:  You have: ? Severe abdominal pain that does not get better with treatment. ? A severe headache that does not get better. ? Vomiting that does not get better. ? Sudden, rapid weight gain. ? Sudden swelling in your hands, ankles, or face. ? Vaginal bleeding. ? Blood in your urine. ? Blurred or double vision. ? Shortness of breath or chest pain. ? Weakness on one side of your body. ? Difficulty speaking.  Your baby is not moving as much as usual. Summary  High blood pressure  (hypertension) is when the force of blood pumping through the arteries is too strong.  Hypertension during pregnancy can cause problems for you and your baby.  Treatment for hypertension during pregnancy varies depending on the type of hypertension you have and how serious it is.  Keep all prenatal and follow-up visits as told by your health care provider. This is important. This information is not intended to replace advice given to you by your health care provider. Make sure you discuss any questions you have with your health care provider. Document Revised: 03/09/2019 Document Reviewed: 12/13/2018 Elsevier Patient Education  2020 [employer].

## 2020-03-06 NOTE — Progress Notes (Signed)
History:   Jackie Newman is a 29 y.o. G2P0010 at 62w2dby early ultrasound being seen today for her first obstetrical visit.  Her obstetrical history is significant for miscarriage x1, nausea and vomiting, hx seizure x1, ?cHTN?.Marland KitchenPatient does intend to breast feed. Pregnancy history fully reviewed.  Pt reports this is a desired and planned pregnancy. Allergies: Doxycycline Current Medications: PNVs, Scopolamine (refill sent today), Promethazine, Pepcid, Robinul, albuterol PRN PMH: history of single seizure age [age]while drinking (pt no longer drinks, never went to neurologist), asthma, hernia at umbilicus. No HTN, DM. PSH: none OB Hx: SAB x1 '@12weeks'  Social Hx: pt does not smoke, drink, or use drugs. Family Hx: none Pt declines flu vaccine.  Patient reports no complaints.      HISTORY: OB History  Gravida Para Term Preterm AB Living  2 0 0 0 1 0  SAB TAB Ectopic Multiple Live Births  1 0 0 0 0    # Outcome Date GA Lbr Len/2nd Weight Sex Delivery Anes PTL Lv  2 Current           1 SAB 2015            Last pap smear was done never.  Past Medical History:  Diagnosis Date  . Asthma   . Congenital umbilical hernia   . Ovarian cyst   . Seizures (HDoral    No follow up. Patient stated she had one seizure during her 23st birthday drinking alcohol.   Past Surgical History:  Procedure Laterality Date  . COLONOSCOPY WITH ESOPHAGOGASTRODUODENOSCOPY (EGD)  12/04/2019   Family History  Problem Relation Age of Onset  . Asthma Father   . Gout Father    Social History   Tobacco Use  . Smoking status: Former Smoker    Packs/day: 5.00    Types: Cigars  . Smokeless tobacco: Never Used  Substance Use Topics  . Alcohol use: No    Comment: occasional socially  . Drug use: Not Currently    Types: Marijuana    Comment: quit 3-4 weeks ago   Allergies  Allergen Reactions  . Doxycycline Nausea And Vomiting   Current Outpatient Medications on File Prior to Visit  Medication  Sig Dispense Refill  . albuterol (PROVENTIL HFA;VENTOLIN HFA) 108 (90 Base) MCG/ACT inhaler Inhale 1-2 puffs into the lungs every 6 (six) hours as needed for wheezing or shortness of breath.    . Blood Pressure Monitoring (BLOOD PRESSURE MONITOR AUTOMAT) DEVI 1 Device by Does not apply route daily. Automatic blood pressure cuff regular size. To monitor blood pressure regularly at home. ICD-10 code: O09.90 1 each 0  . Misc. Devices (GOJJI WEIGHT SCALE) MISC 1 Device by Does not apply route daily as needed. To weight self daily as needed at home. ICD-10 code: O09.90 1 each 0  . promethazine (PHENERGAN) 25 MG suppository Place 1 suppository (25 mg total) rectally every 6 (six) hours as needed for nausea or vomiting. Insert vaginally 12 each 3  . glycopyrrolate (ROBINUL) 1 MG tablet Take 1 tablet (1 mg total) by mouth 3 (three) times daily as needed. (Patient not taking: Reported on 02/13/2020) 30 tablet 0  . metoCLOPramide (REGLAN) 10 MG tablet Take 1 tablet (10 mg total) by mouth every 6 (six) hours. (Patient not taking: Reported on 02/13/2020) 30 tablet 0   No current facility-administered medications on file prior to visit.    Review of Systems Pertinent items noted in HPI and remainder of comprehensive ROS otherwise negative. Physical  Exam:   Vitals:   03/06/20 1442  BP: 133/81  Pulse: (!) 106  Temp: 98.4 F (36.9 C)  Weight: 181 lb 9.6 oz (82.4 kg)   Fetal Heart Rate (bpm): 164 Uterus:    c/w 12 week size  Pelvic Exam: Perineum: no hemorrhoids, normal perineum   Vulva: normal external genitalia, no lesions   Vagina:  normal mucosa, normal discharge   Cervix: no lesions and normal, pap smear done.    Adnexa: normal adnexa and no mass, fullness, tenderness   Bony Pelvis: average  System: General: well-developed, well-nourished female in no acute distress   Breasts:  normal appearance, no masses or tenderness bilaterally   Skin: normal coloration and turgor, no rashes   Neurologic:  oriented, normal, negative, normal mood   Extremities: normal strength, tone, and muscle mass, ROM of all joints is normal   HEENT PERRLA, extraocular movement intact and sclera clear, anicteric   Mouth/Teeth mucous membranes moist, pharynx normal without lesions and dental hygiene good   Neck supple and no masses   Cardiovascular: regular rate and rhythm   Respiratory:  no respiratory distress, normal breath sounds   Abdomen: soft, non-tender; bowel sounds normal; no masses,  no organomegaly     Assessment:    Pregnancy: G2P0010 Patient Active Problem List   Diagnosis Date Noted  . Supervision of other normal pregnancy, antepartum 02/13/2020  . Intrauterine pregnancy 01/22/2020  . Morning sickness 01/22/2020     Plan:    1. Supervision of other normal pregnancy, antepartum -peds list given -information on contraception given - Obstetric Panel, Including HIV - Culture, OB Urine - Cytology - PAP( [place]) - Hepatitis C Antibody - Genetic Screening - Hemoglobin A1c  2. Chronic Hypertension? -pt reports no official diagnosis of HTN, but reports her BP "keeps going up" -pt has baby ASA at home and will start taking daily - Protein / creatinine ratio, urine - Comp Met (CMET) -elevated blood pressures in [employer] app and note of cHTN on sticky note, but not in problem list, recent in-person Bps seem to be normal or less than 140/90. Per consultation with Dr. Ilda Basset, no need for BP meds today, pt to bring cuff to clinic to assess for fit at next visit.  3. Morning sickness - scopolamine (TRANSDERM-SCOP, 1.5 MG,) 1 MG/3DAYS; Place 1 patch (1.5 mg total) onto the skin every 3 (three) days for 8 doses.  Dispense: 4 patch; Refill: 1 -pt also using phenergan and Pepcid  4. History of seizure - folic acid (FOLVITE) 1 MG tablet; Take 1 tablet (1 mg total) by mouth daily.  Dispense: 30 tablet; Refill: 6 - Ambulatory referral to Neurology   Initial labs drawn. Continue  prenatal vitamins. Genetic Screening discussed, NIPS: requested. Ultrasound discussed; fetal anatomic survey: requested. Problem list reviewed and updated. The nature of [facility] with multiple MDs and other Advanced Practice Providers was explained to patient; also emphasized that residents, students are part of our team. Routine obstetric precautions reviewed. Return in about 4 weeks (around 04/03/2020) for in-person ROB/AFP, needs anatomy scan, neurology referral.     Clarisa Fling, NP  3:25 PM 03/06/2020

## 2020-03-07 ENCOUNTER — Encounter: Payer: Self-pay | Admitting: Women's Health

## 2020-03-07 DIAGNOSIS — A5901 Trichomonal vulvovaginitis: Secondary | ICD-10-CM | POA: Insufficient documentation

## 2020-03-07 LAB — COMPREHENSIVE METABOLIC PANEL
ALT: 30 IU/L (ref 0–32)
AST: 22 IU/L (ref 0–40)
Albumin/Globulin Ratio: 1.6 (ref 1.2–2.2)
Albumin: 4.1 g/dL (ref 3.9–5.0)
Alkaline Phosphatase: 59 IU/L (ref 39–117)
BUN/Creatinine Ratio: 8 — ABNORMAL LOW (ref 9–23)
BUN: 5 mg/dL — ABNORMAL LOW (ref 6–20)
Bilirubin Total: <0.2 mg/dL (ref 0.0–1.2)
CO2: 19 mmol/L — ABNORMAL LOW (ref 20–29)
Calcium: 9.2 mg/dL (ref 8.7–10.2)
Chloride: 102 mmol/L (ref 96–106)
Creatinine, Ser: 0.65 mg/dL (ref 0.57–1.00)
GFR calc Af Amer: 142 mL/min/{1.73_m2} (ref >59)
GFR calc non Af Amer: 123 mL/min/{1.73_m2} (ref >59)
Globulin, Total: 2.6 g/dL (ref 1.5–4.5)
Glucose: 85 mg/dL (ref 65–99)
Potassium: 4.1 mmol/L (ref 3.5–5.2)
Sodium: 135 mmol/L (ref 134–144)
Total Protein: 6.7 g/dL (ref 6.0–8.5)

## 2020-03-07 LAB — HEMOGLOBIN A1C
Est. average glucose Bld gHb Est-mCnc: 111 mg/dL
Hgb A1c MFr Bld: 5.5 % (ref 4.8–5.6)

## 2020-03-07 LAB — CERVICOVAGINAL ANCILLARY ONLY
Bacterial Vaginitis (gardnerella): POSITIVE — AB
Candida Glabrata: NEGATIVE
Candida Vaginitis: NEGATIVE
Chlamydia: NEGATIVE
Comment: NEGATIVE
Comment: NEGATIVE
Comment: NEGATIVE
Comment: NEGATIVE
Comment: NEGATIVE
Comment: NORMAL
Neisseria Gonorrhea: NEGATIVE
Trichomonas: POSITIVE — AB

## 2020-03-07 LAB — OBSTETRIC PANEL, INCLUDING HIV
Antibody Screen: NEGATIVE
Basophils Absolute: 0 10*3/uL (ref 0.0–0.2)
Basos: 0 %
EOS (ABSOLUTE): 0.1 10*3/uL (ref 0.0–0.4)
Eos: 1 %
HIV Screen 4th Generation wRfx: NONREACTIVE
Hematocrit: 37.8 % (ref 34.0–46.6)
Hemoglobin: 13.5 g/dL (ref 11.1–15.9)
Hepatitis B Surface Ag: NEGATIVE
Immature Grans (Abs): 0 10*3/uL (ref 0.0–0.1)
Immature Granulocytes: 0 %
Lymphocytes Absolute: 3.2 10*3/uL — ABNORMAL HIGH (ref 0.7–3.1)
Lymphs: 28 %
MCH: 31.7 pg (ref 26.6–33.0)
MCHC: 35.7 g/dL (ref 31.5–35.7)
MCV: 89 fL (ref 79–97)
Monocytes Absolute: 1.2 10*3/uL — ABNORMAL HIGH (ref 0.1–0.9)
Monocytes: 10 %
Neutrophils Absolute: 6.8 10*3/uL (ref 1.4–7.0)
Neutrophils: 61 %
Platelets: 263 10*3/uL (ref 150–450)
RBC: 4.26 x10E6/uL (ref 3.77–5.28)
RDW: 13 % (ref 11.7–15.4)
RPR Ser Ql: NONREACTIVE
Rh Factor: POSITIVE
Rubella Antibodies, IGG: 1.69 index (ref >0.99)
WBC: 11.3 10*3/uL — ABNORMAL HIGH (ref 3.4–10.8)

## 2020-03-07 LAB — PROTEIN / CREATININE RATIO, URINE
Creatinine, Urine: 242.6 mg/dL
Protein, Ur: 13.4 mg/dL
Protein/Creat Ratio: 55 mg/g creat (ref 0–200)

## 2020-03-07 LAB — HEPATITIS C ANTIBODY: Hep C Virus Ab: <0.1 s/co ratio (ref 0.0–0.9)

## 2020-03-08 ENCOUNTER — Telehealth: Payer: Self-pay | Admitting: *Deleted

## 2020-03-08 DIAGNOSIS — B9689 Other specified bacterial agents as the cause of diseases classified elsewhere: Secondary | ICD-10-CM

## 2020-03-08 DIAGNOSIS — A5901 Trichomonal vulvovaginitis: Secondary | ICD-10-CM

## 2020-03-08 LAB — URINE CULTURE, OB REFLEX

## 2020-03-08 LAB — CYTOLOGY - PAP

## 2020-03-08 LAB — CULTURE, OB URINE

## 2020-03-08 MED ORDER — METRONIDAZOLE 500 MG PO TABS: 500.0000 mg | ORAL_TABLET | Freq: Two times a day (BID) | ORAL | 0 refills | Status: DC

## 2020-03-08 NOTE — Telephone Encounter (Signed)
Left voice message for patient to return nurse call regarding test results. Will a Psychologist, forensic.  Clovis Pu, RN

## 2020-03-08 NOTE — Progress Notes (Signed)
Good morning,  Please call this patient to schedule for a colposcopy. She has been notified via MyChart. Pregnant patients are usually scheduled in the second trimester around 20-22 weeks.  Thank you, Joni Reining

## 2020-03-08 NOTE — Telephone Encounter (Signed)
-----   Message from Marylen Ponto, NP sent at 03/07/2020  1:52 PM EDT ----- Regarding: Test Results Good afternoon,  This patient tested positive for trichomonas and BV. Please send treatment per protocol. Thank you, Joni Reining ----- Message ----- From: [facility] Results In Sent: 03/07/2020   7:38 AM EDT To: Marylen Ponto, NP

## 2020-03-08 NOTE — Telephone Encounter (Signed)
Patient return call regarding test results. Verified DOB. Patient +BV and Trich.  Clovis Pu, RN

## 2020-03-12 ENCOUNTER — Other Ambulatory Visit: Payer: Self-pay | Admitting: *Deleted

## 2020-03-12 ENCOUNTER — Telehealth: Payer: Self-pay | Admitting: *Deleted

## 2020-03-12 MED ORDER — PROMETHAZINE HCL 25 MG RE SUPP: 25.0000 mg | Freq: Four times a day (QID) | RECTAL | 3 refills | Status: DC | PRN

## 2020-03-12 NOTE — Telephone Encounter (Signed)
Patient called stating she is having an increase of salvia. She is requesting something to help with that.  Clovis Pu, RN

## 2020-03-14 ENCOUNTER — Other Ambulatory Visit: Payer: Self-pay | Admitting: Women's Health

## 2020-03-14 DIAGNOSIS — O210 Mild hyperemesis gravidarum: Secondary | ICD-10-CM

## 2020-03-14 MED ORDER — GLYCOPYRROLATE 1 MG PO TABS: 1.0000 mg | ORAL_TABLET | Freq: Three times a day (TID) | ORAL | 0 refills | Status: DC

## 2020-03-14 NOTE — Progress Notes (Signed)
Refill Robinul per pt request.  Marylen Ponto, NP  4:09 PM 03/14/2020

## 2020-03-14 NOTE — Telephone Encounter (Signed)
RX has been sent to pharmacy per pt request.  Note for patient's chart: called and spoke with pharmacy, pt picked up medication sent 01/29/2020 on 01/30/2020.  Marylen Ponto, NP  4:10 PM 03/14/2020

## 2020-03-14 NOTE — Telephone Encounter (Signed)
Called patient to verify if she received a Rx for Robinul in March. Patient stated she did not receive or have Rx for Robinul. She would like that Rx resent to her pharmacy.  Clovis Pu, RN

## 2020-03-14 NOTE — Telephone Encounter (Signed)
Hi Tamika,  This patient was given a prescription for Robinul already.  Is she out of this medication? Has she tried this and it worked for her?  Thank you, Joni Reining

## 2020-03-15 ENCOUNTER — Encounter: Payer: Self-pay | Admitting: General Practice

## 2020-03-18 ENCOUNTER — Telehealth: Payer: Self-pay | Admitting: *Deleted

## 2020-03-18 NOTE — Telephone Encounter (Signed)
Received call from [facility] stating patient's blood pressures were high.     Clovis Pu, RN

## 2020-03-19 ENCOUNTER — Encounter: Payer: Self-pay | Admitting: General Practice

## 2020-04-01 ENCOUNTER — Telehealth: Payer: Self-pay | Admitting: General Practice

## 2020-04-01 NOTE — Telephone Encounter (Signed)
Pt left message with after hours call center.  Returned call to pt, but no answer.  Left message on VM for pt to give our office a call.

## 2020-04-04 ENCOUNTER — Encounter: Payer: Self-pay | Admitting: General Practice

## 2020-04-04 ENCOUNTER — Other Ambulatory Visit: Payer: Self-pay

## 2020-04-04 ENCOUNTER — Ambulatory Visit (INDEPENDENT_AMBULATORY_CARE_PROVIDER_SITE_OTHER): Payer: Medicaid Other | Admitting: Obstetrics and Gynecology

## 2020-04-04 VITALS — BP 133/90 | HR 111 | Temp 98.3°F | Wt 183.4 lb

## 2020-04-04 DIAGNOSIS — O0992 Supervision of high risk pregnancy, unspecified, second trimester: Secondary | ICD-10-CM

## 2020-04-04 DIAGNOSIS — A5901 Trichomonal vulvovaginitis: Secondary | ICD-10-CM

## 2020-04-04 DIAGNOSIS — O26842 Uterine size-date discrepancy, second trimester: Secondary | ICD-10-CM

## 2020-04-04 DIAGNOSIS — O10912 Unspecified pre-existing hypertension complicating pregnancy, second trimester: Secondary | ICD-10-CM | POA: Diagnosis not present

## 2020-04-04 DIAGNOSIS — O98312 Other infections with a predominantly sexual mode of transmission complicating pregnancy, second trimester: Secondary | ICD-10-CM

## 2020-04-04 DIAGNOSIS — O10919 Unspecified pre-existing hypertension complicating pregnancy, unspecified trimester: Secondary | ICD-10-CM

## 2020-04-04 DIAGNOSIS — Z3A16 16 weeks gestation of pregnancy: Secondary | ICD-10-CM

## 2020-04-04 MED ORDER — LABETALOL HCL 200 MG PO TABS: 200.0000 mg | ORAL_TABLET | Freq: Two times a day (BID) | ORAL | 3 refills | Status: DC

## 2020-04-04 MED ORDER — ASPIRIN 81 MG PO CHEW: 81.0000 mg | CHEWABLE_TABLET | Freq: Every day | ORAL | 6 refills | Status: DC

## 2020-04-04 NOTE — Patient Instructions (Signed)

## 2020-04-04 NOTE — Progress Notes (Signed)
  HIGH-RISK PREGNANCY OFFICE VISIT Patient name: Jackie Newman MRN [id number]  Date of birth: 08-Jul-1991 Chief Complaint:   Routine Prenatal Visit  History of Present Illness:   Jackie Newman is a 29 y.o. G36P0010 female at [redacted]w[redacted]d with an Estimated Date of Delivery: 09/16/20 being seen today for ongoing management of a high-risk pregnancy complicated by [facility] currently on no meds Today she reports increased pelvic pressure. Contractions: Not present. Vag. Bleeding: None.  Movement: Absent. denies leaking of fluid.  Review of Systems:   Pertinent items are noted in HPI Denies abnormal vaginal discharge w/ itching/odor/irritation, headaches, visual changes, shortness of breath, chest pain, abdominal pain, severe nausea/vomiting, or problems with urination or bowel movements unless otherwise stated above. Pertinent History Reviewed:  Reviewed past medical,surgical, social, obstetrical and family history.  Reviewed problem list, medications and allergies. Physical Assessment:   Vitals:   04/04/20 1358  BP: 133/90  Pulse: (!) 111  Temp: 98.3 F (36.8 C)  Weight: 183 lb 6.4 oz (83.2 kg)  Body mass index is 30.52 kg/m.           Physical Examination:   General appearance: alert, well appearing, and in no distress  Mental status: alert, oriented to person, place, and time  Skin: warm & dry   Extremities: Edema: None    Cardiovascular: normal heart rate noted  Respiratory: normal respiratory effort, no distress  Abdomen: gravid, soft, non-tender  Pelvic: Cervical exam deferred         Fetal Status: Fetal Heart Rate (bpm): 153 Fundal Height: 20 cm Movement: Absent    Fetal Surveillance Testing today: none   No results found for this or any previous visit (from the past 24 hour(s)).  Assessment & Plan:  1) High-risk pregnancy G2P0010 at [redacted]w[redacted]d with an Estimated Date of Delivery: 09/16/20   2) Supervision of high risk pregnancy in second trimester  - AFP TETRA  3) Chronic  hypertension affecting pregnancy  - Rx for labetalol (NORMODYNE) 200 MG tablet, aspirin 81 MG chewable tablet  4) Trichomonal vaginitis during pregnancy, antepartum  - Cervicovaginal ancillary only( [place])  5) Uterine size date discrepancy pregnancy, second trimester - Anatomy U/S scheduled for 04/22/20   Meds:  Meds ordered this encounter  Medications  . labetalol (NORMODYNE) 200 MG tablet    Sig: Take 1 tablet (200 mg total) by mouth 2 (two) times daily.    Dispense:  60 tablet    Refill:  3    Order Specific Question:   Supervising Provider    Answer:   Reva Bores [[id number]]  . aspirin 81 MG chewable tablet    Sig: Chew 1 tablet (81 mg total) by mouth daily.    Dispense:  30 tablet    Refill:  6    Order Specific Question:   Supervising Provider    Answer:   Reva Bores [[id number]]    Labs/procedures today: none  Treatment Plan:  Continue OB care  Reviewed: Preterm labor symptoms and general obstetric precautions including but not limited to vaginal bleeding, contractions, leaking of fluid and fetal movement were reviewed in detail with the patient.  All questions were answered. Has home bp cuff. Check bp weekly, let us know if >140/90.   Follow-up: Return in about 2 weeks (around 04/18/2020) for Nurse Visit - BP check.  Orders Placed This Encounter  Procedures  . AFP TETRA   Raelyn Mora MSN, CNM 04/04/2020 2:14 PM

## 2020-04-05 ENCOUNTER — Telehealth: Payer: Self-pay | Admitting: *Deleted

## 2020-04-05 NOTE — Telephone Encounter (Signed)
Received call from [facility] stating they received vaginal self-swab culture for patient. They received the order, however the sample was not labeled with patient's name/DOB. Patient was called and informed, however patient stated she could not return to clinic today for another sample. She will call clinic next week.   Clovis Pu, RN

## 2020-04-06 LAB — AFP TETRA
DIA Mom Value: 1.39
DIA Value (EIA): 197.15 pg/mL
DSR (By Age)    1 IN: 924
DSR (Second Trimester) 1 IN: 971
Gestational Age: 16 WEEKS
MSAFP Mom: 0.82
MSAFP: 27.3 ng/mL
MSHCG Mom: 1.44
MSHCG: 51884 m[IU]/mL
Maternal Age At EDD: 27 yr
Osb Risk: 10000
T18 (By Age): 1:3600 {titer}
Test Results:: NEGATIVE
Weight: 183 [lb_av]
uE3 Mom: 0.86
uE3 Value: 0.77 ng/mL

## 2020-04-11 ENCOUNTER — Ambulatory Visit (INDEPENDENT_AMBULATORY_CARE_PROVIDER_SITE_OTHER): Payer: Medicaid Other | Admitting: *Deleted

## 2020-04-11 ENCOUNTER — Other Ambulatory Visit (HOSPITAL_COMMUNITY)
Admission: RE | Admit: 2020-04-11 | Discharge: 2020-04-11 | Disposition: A | Discharge status: Home / Self Care | Payer: Medicaid Other | Source: Ambulatory Visit | Attending: Obstetrics and Gynecology | Admitting: Obstetrics and Gynecology

## 2020-04-11 VITALS — BP 119/75 | HR 98 | Temp 98.3°F | Wt 188.2 lb

## 2020-04-11 DIAGNOSIS — Z113 Encounter for screening for infections with a predominantly sexual mode of transmission: Secondary | ICD-10-CM | POA: Insufficient documentation

## 2020-04-11 DIAGNOSIS — Z0130 Encounter for examination of blood pressure without abnormal findings: Secondary | ICD-10-CM

## 2020-04-11 NOTE — Progress Notes (Signed)
   Subjective:  Kristelle Cavallaro is a 29 y.o. female here for BP check.   Hypertension ROS: no TIA's, no chest pain on exertion, no dyspnea on exertion, no swelling of ankles, no orthostatic dizziness or lightheadedness, no orthopnea or paroxysmal nocturnal dyspnea and no palpitations.    Objective:  BP 119/75 (BP Location: Right Arm, Patient Position: Sitting, Cuff Size: Normal)   Pulse 98   Temp 98.3 F (36.8 C) (Oral)   Wt 188 lb 3.2 oz (85.4 kg)   LMP 12/11/2019   BMI 31.32 kg/m   Appearance alert, well appearing, and in no distress, oriented to person, place, and time and normal appearing weight. General exam BP noted to be well controlled today in office.    Assessment:   Blood Pressure well controlled and asymptomatic.   Plan:  Current treatment plan is effective, no change in therapy.  Repeat STD screen-self swab. Last culture was not labeled.  Clovis Pu, RN

## 2020-04-12 LAB — CERVICOVAGINAL ANCILLARY ONLY
Bacterial Vaginitis (gardnerella): NEGATIVE
Candida Glabrata: NEGATIVE
Candida Vaginitis: POSITIVE — AB
Chlamydia: NEGATIVE
Comment: NEGATIVE
Comment: NEGATIVE
Comment: NEGATIVE
Comment: NEGATIVE
Comment: NEGATIVE
Comment: NORMAL
Neisseria Gonorrhea: NEGATIVE
Trichomonas: NEGATIVE

## 2020-04-16 ENCOUNTER — Ambulatory Visit: Payer: Medicaid Other

## 2020-04-16 ENCOUNTER — Telehealth: Payer: Self-pay | Admitting: *Deleted

## 2020-04-16 DIAGNOSIS — B3731 Acute candidiasis of vulva and vagina: Secondary | ICD-10-CM

## 2020-04-16 MED ORDER — TERCONAZOLE 0.4 % VA CREA: 1.0000 | TOPICAL_CREAM | Freq: Every day | VAGINAL | 0 refills | Status: DC

## 2020-04-16 NOTE — Telephone Encounter (Signed)
-----   Message from Raelyn Mora, [place] sent at 04/14/2020 12:02 PM EDT ----- Treat for yeast

## 2020-04-22 ENCOUNTER — Other Ambulatory Visit: Payer: Self-pay | Admitting: Women's Health

## 2020-04-22 ENCOUNTER — Ambulatory Visit (HOSPITAL_COMMUNITY): Payer: Medicaid Other | Attending: Women's Health

## 2020-04-22 ENCOUNTER — Other Ambulatory Visit: Payer: Self-pay | Admitting: *Deleted

## 2020-04-22 ENCOUNTER — Other Ambulatory Visit: Payer: Self-pay

## 2020-04-22 DIAGNOSIS — Z3480 Encounter for supervision of other normal pregnancy, unspecified trimester: Secondary | ICD-10-CM

## 2020-04-22 DIAGNOSIS — A5901 Trichomonal vulvovaginitis: Secondary | ICD-10-CM | POA: Diagnosis present

## 2020-04-22 DIAGNOSIS — Z3490 Encounter for supervision of normal pregnancy, unspecified, unspecified trimester: Secondary | ICD-10-CM | POA: Diagnosis present

## 2020-04-22 DIAGNOSIS — O10919 Unspecified pre-existing hypertension complicating pregnancy, unspecified trimester: Secondary | ICD-10-CM

## 2020-04-22 DIAGNOSIS — Z363 Encounter for antenatal screening for malformations: Secondary | ICD-10-CM | POA: Diagnosis not present

## 2020-04-22 DIAGNOSIS — O210 Mild hyperemesis gravidarum: Secondary | ICD-10-CM | POA: Insufficient documentation

## 2020-05-02 ENCOUNTER — Other Ambulatory Visit: Payer: Self-pay | Admitting: Obstetrics and Gynecology

## 2020-05-02 ENCOUNTER — Telehealth: Payer: Self-pay | Admitting: *Deleted

## 2020-05-02 ENCOUNTER — Ambulatory Visit (INDEPENDENT_AMBULATORY_CARE_PROVIDER_SITE_OTHER): Payer: Medicaid Other | Admitting: Family Medicine

## 2020-05-02 ENCOUNTER — Other Ambulatory Visit: Payer: Self-pay

## 2020-05-02 VITALS — BP 118/66 | HR 79 | Wt 188.0 lb

## 2020-05-02 DIAGNOSIS — Z3A20 20 weeks gestation of pregnancy: Secondary | ICD-10-CM | POA: Diagnosis not present

## 2020-05-02 DIAGNOSIS — O3442 Maternal care for other abnormalities of cervix, second trimester: Secondary | ICD-10-CM

## 2020-05-02 DIAGNOSIS — G43009 Migraine without aura, not intractable, without status migrainosus: Secondary | ICD-10-CM

## 2020-05-02 DIAGNOSIS — R87612 Low grade squamous intraepithelial lesion on cytologic smear of cervix (LGSIL): Secondary | ICD-10-CM

## 2020-05-02 DIAGNOSIS — Z3490 Encounter for supervision of normal pregnancy, unspecified, unspecified trimester: Secondary | ICD-10-CM

## 2020-05-02 DIAGNOSIS — O10912 Unspecified pre-existing hypertension complicating pregnancy, second trimester: Secondary | ICD-10-CM

## 2020-05-02 DIAGNOSIS — Z3480 Encounter for supervision of other normal pregnancy, unspecified trimester: Secondary | ICD-10-CM

## 2020-05-02 DIAGNOSIS — R200 Anesthesia of skin: Secondary | ICD-10-CM

## 2020-05-02 DIAGNOSIS — O10919 Unspecified pre-existing hypertension complicating pregnancy, unspecified trimester: Secondary | ICD-10-CM

## 2020-05-02 DIAGNOSIS — A5901 Trichomonal vulvovaginitis: Secondary | ICD-10-CM

## 2020-05-02 DIAGNOSIS — O210 Mild hyperemesis gravidarum: Secondary | ICD-10-CM

## 2020-05-02 DIAGNOSIS — O98312 Other infections with a predominantly sexual mode of transmission complicating pregnancy, second trimester: Secondary | ICD-10-CM

## 2020-05-02 LAB — POCT URINALYSIS DIP (DEVICE)
Bilirubin Urine: NEGATIVE
Glucose, UA: NEGATIVE mg/dL
Hgb urine dipstick: NEGATIVE
Ketones, ur: 15 mg/dL — AB
Leukocytes,Ua: NEGATIVE
Nitrite: NEGATIVE
Protein, ur: NEGATIVE mg/dL
Specific Gravity, Urine: 1.02 (ref 1.005–1.030)
Urobilinogen, UA: 0.2 mg/dL (ref 0.0–1.0)
pH: 6 (ref 5.0–8.0)

## 2020-05-02 MED ORDER — WRIST SPLINT/COCK-UP/LEFT L MISC: 1.0000 | Freq: Two times a day (BID) | 0 refills | Status: DC | PRN

## 2020-05-02 MED ORDER — ALBUTEROL SULFATE HFA 108 (90 BASE) MCG/ACT IN AERS: 1.0000 | INHALATION_SPRAY | Freq: Four times a day (QID) | RESPIRATORY_TRACT | 3 refills | Status: AC | PRN

## 2020-05-02 MED ORDER — CYCLOBENZAPRINE HCL 10 MG PO TABS: 10.0000 mg | ORAL_TABLET | Freq: Three times a day (TID) | ORAL | 1 refills | Status: DC | PRN

## 2020-05-02 MED ORDER — WRIST SPLINT/COCK-UP/RIGHT L MISC: 1.0000 | 0 refills | Status: DC | PRN

## 2020-05-02 NOTE — Telephone Encounter (Signed)
Return call to patient. Patient reported losing feeling in right hand and waking up with migraines for 1 week. Patient is taking Extra Strength Tylenol for the headaches with some relief. Patient reported taking blood pressure medications as prescribed, BP today 154/100, HR 86. Patient denies visual changes, dizziness, n/v, abd pain. Consulted with Raelyn Mora, CNM; sent in Rx for Flexeril 10 mg tablet for headaches and placed neurology referral. Patient has an appointment with Dr. Shawnie Pons at [facility] for colpo today. Called [facility] to speak with Dr. Shawnie Pons or her nurse; left message to call [facility].  Clovis Pu, RN

## 2020-05-02 NOTE — Telephone Encounter (Signed)
-----   Message from Marti Sleigh, [place] sent at 05/02/2020  9:16 AM EDT ----- Regarding: Questions Temp said that she called requesting to speak with you and had questions about her body.  Wanted you to give her a call back.

## 2020-05-02 NOTE — Patient Instructions (Addendum)
 Breastfeeding  Choosing to breastfeed is one of the best decisions you can make for yourself and your baby. A change in hormones during pregnancy causes your breasts to make breast milk in your milk-producing glands. Hormones prevent breast milk from being released before your baby is born. They also prompt milk flow after birth. Once breastfeeding has begun, thoughts of your baby, as well as his or her sucking or crying, can stimulate the release of milk from your milk-producing glands. Benefits of breastfeeding Research shows that breastfeeding offers many health benefits for infants and mothers. It also offers a cost-free and convenient way to feed your baby. For your baby  Your first milk (colostrum) helps your baby's digestive system to function better.  Special cells in your milk (antibodies) help your baby to fight off infections.  Breastfed babies are less likely to develop asthma, allergies, obesity, or type 2 diabetes. They are also at lower risk for sudden infant death syndrome (SIDS).  Nutrients in breast milk are better able to meet your baby's needs compared to infant formula.  Breast milk improves your baby's brain development. For you  Breastfeeding helps to create a very special bond between you and your baby.  Breastfeeding is convenient. Breast milk costs nothing and is always available at the correct temperature.  Breastfeeding helps to burn calories. It helps you to lose the weight that you gained during pregnancy.  Breastfeeding makes your uterus return faster to its size before pregnancy. It also slows bleeding (lochia) after you give birth.  Breastfeeding helps to lower your risk of developing type 2 diabetes, osteoporosis, rheumatoid arthritis, cardiovascular disease, and breast, ovarian, uterine, and endometrial cancer later in life. Breastfeeding basics Starting breastfeeding  Find a comfortable place to sit or lie down, with your neck and back  well-supported.  Place a pillow or a rolled-up blanket under your baby to bring him or her to the level of your breast (if you are seated). Nursing pillows are specially designed to help support your arms and your baby while you breastfeed.  Make sure that your baby's tummy (abdomen) is facing your abdomen.  Gently massage your breast. With your fingertips, massage from the outer edges of your breast inward toward the nipple. This encourages milk flow. If your milk flows slowly, you may need to continue this action during the feeding.  Support your breast with 4 fingers underneath and your thumb above your nipple (make the letter "C" with your hand). Make sure your fingers are well away from your nipple and your baby's mouth.  Stroke your baby's lips gently with your finger or nipple.  When your baby's mouth is open wide enough, quickly bring your baby to your breast, placing your entire nipple and as much of the areola as possible into your baby's mouth. The areola is the colored area around your nipple. ? More areola should be visible above your baby's upper lip than below the lower lip. ? Your baby's lips should be opened and extended outward (flanged) to ensure an adequate, comfortable latch. ? Your baby's tongue should be between his or her lower gum and your breast.  Make sure that your baby's mouth is correctly positioned around your nipple (latched). Your baby's lips should create a seal on your breast and be turned out (everted).  It is common for your baby to suck about 2-3 minutes in order to start the flow of breast milk. Latching Teaching your baby how to latch onto your breast properly   is very important. An improper latch can cause nipple pain, decreased milk supply, and poor weight gain in your baby. Also, if your baby is not latched onto your nipple properly, he or she may swallow some air during feeding. This can make your baby fussy. Burping your baby when you switch breasts  during the feeding can help to get rid of the air. However, teaching your baby to latch on properly is still the best way to prevent fussiness from swallowing air while breastfeeding. Signs that your baby has successfully latched onto your nipple  Silent tugging or silent sucking, without causing you pain. Infant's lips should be extended outward (flanged).  Swallowing heard between every 3-4 sucks once your milk has started to flow (after your let-down milk reflex occurs).  Muscle movement above and in front of his or her ears while sucking. Signs that your baby has not successfully latched onto your nipple  Sucking sounds or smacking sounds from your baby while breastfeeding.  Nipple pain. If you think your baby has not latched on correctly, slip your finger into the corner of your baby's mouth to break the suction and place it between your baby's gums. Attempt to start breastfeeding again. Signs of successful breastfeeding Signs from your baby  Your baby will gradually decrease the number of sucks or will completely stop sucking.  Your baby will fall asleep.  Your baby's body will relax.  Your baby will retain a small amount of milk in his or her mouth.  Your baby will let go of your breast by himself or herself. Signs from you  Breasts that have increased in firmness, weight, and size 1-3 hours after feeding.  Breasts that are softer immediately after breastfeeding.  Increased milk volume, as well as a change in milk consistency and color by the fifth day of breastfeeding.  Nipples that are not sore, cracked, or bleeding. Signs that your baby is getting enough milk  Wetting at least 1-2 diapers during the first 24 hours after birth.  Wetting at least 5-6 diapers every 24 hours for the first week after birth. The urine should be clear or pale yellow by the age of 5 days.  Wetting 6-8 diapers every 24 hours as your baby continues to grow and develop.  At least 3 stools in  a 24-hour period by the age of 5 days. The stool should be soft and yellow.  At least 3 stools in a 24-hour period by the age of 7 days. The stool should be seedy and yellow.  No loss of weight greater than 10% of birth weight during the first 3 days of life.  Average weight gain of 4-7 oz (113-198 g) per week after the age of 4 days.  Consistent daily weight gain by the age of 5 days, without weight loss after the age of 2 weeks. After a feeding, your baby may spit up a small amount of milk. This is normal. Breastfeeding frequency and duration Frequent feeding will help you make more milk and can prevent sore nipples and extremely full breasts (breast engorgement). Breastfeed when you feel the need to reduce the fullness of your breasts or when your baby shows signs of hunger. This is called "breastfeeding on demand." Signs that your baby is hungry include:  Increased alertness, activity, or restlessness.  Movement of the head from side to side.  Opening of the mouth when the corner of the mouth or cheek is stroked (rooting).  Increased sucking sounds, smacking lips,   cooing, sighing, or squeaking.  Hand-to-mouth movements and sucking on fingers or hands.  Fussing or crying. Avoid introducing a pacifier to your baby in the first 4-6 weeks after your baby is born. After this time, you may choose to use a pacifier. Research has shown that pacifier use during the first year of a baby's life decreases the risk of sudden infant death syndrome (SIDS). Allow your baby to feed on each breast as long as he or she wants. When your baby unlatches or falls asleep while feeding from the first breast, offer the second breast. Because newborns are often sleepy in the first few weeks of life, you may need to awaken your baby to get him or her to feed. Breastfeeding times will vary from baby to baby. However, the following rules can serve as a guide to help you make sure that your baby is properly  fed:  Newborns (babies 4 weeks of age or younger) may breastfeed every 1-3 hours.  Newborns should not go without breastfeeding for longer than 3 hours during the day or 5 hours during the night.  You should breastfeed your baby a minimum of 8 times in a 24-hour period. Breast milk pumping     Pumping and storing breast milk allows you to make sure that your baby is exclusively fed your breast milk, even at times when you are unable to breastfeed. This is especially important if you go back to work while you are still breastfeeding, or if you are not able to be present during feedings. Your lactation consultant can help you find a method of pumping that works best for you and give you guidelines about how long it is safe to store breast milk. Caring for your breasts while you breastfeed Nipples can become dry, cracked, and sore while breastfeeding. The following recommendations can help keep your breasts moisturized and healthy:  Avoid using soap on your nipples.  Wear a supportive bra designed especially for nursing. Avoid wearing underwire-style bras or extremely tight bras (sports bras).  Air-dry your nipples for 3-4 minutes after each feeding.  Use only cotton bra pads to absorb leaked breast milk. Leaking of breast milk between feedings is normal.  Use lanolin on your nipples after breastfeeding. Lanolin helps to maintain your skin's normal moisture barrier. Pure lanolin is not harmful (not toxic) to your baby. You may also hand express a few drops of breast milk and gently massage that milk into your nipples and allow the milk to air-dry. In the first few weeks after giving birth, some women experience breast engorgement. Engorgement can make your breasts feel heavy, warm, and tender to the touch. Engorgement peaks within 3-5 days after you give birth. The following recommendations can help to ease engorgement:  Completely empty your breasts while breastfeeding or pumping. You may  want to start by applying warm, moist heat (in the shower or with warm, water-soaked hand towels) just before feeding or pumping. This increases circulation and helps the milk flow. If your baby does not completely empty your breasts while breastfeeding, pump any extra milk after he or she is finished.  Apply ice packs to your breasts immediately after breastfeeding or pumping, unless this is too uncomfortable for you. To do this: ? Put ice in a plastic bag. ? Place a towel between your skin and the bag. ? Leave the ice on for 20 minutes, 2-3 times a day.  Make sure that your baby is latched on and positioned properly while breastfeeding.   If engorgement persists after 48 hours of following these recommendations, contact your health care provider or a Advertising copywriter. Overall health care recommendations while breastfeeding  Eat 3 healthy meals and 3 snacks every day. Well-nourished mothers who are breastfeeding need an additional 450-500 calories a day. You can meet this requirement by increasing the amount of a balanced diet that you eat.  Drink enough water to keep your urine pale yellow or clear.  Rest often, relax, and continue to take your prenatal vitamins to prevent fatigue, stress, and low vitamin and mineral levels in your body (nutrient deficiencies).  Do not use any products that contain nicotine or tobacco, such as cigarettes and e-cigarettes. Your baby may be harmed by chemicals from cigarettes that pass into breast milk and exposure to secondhand smoke. If you need help quitting, ask your health care provider.  Avoid alcohol.  Do not use illegal drugs or marijuana.  Talk with your health care provider before taking any medicines. These include over-the-counter and prescription medicines as well as vitamins and herbal supplements. Some medicines that may be harmful to your baby can pass through breast milk.  It is possible to become pregnant while breastfeeding. If birth  control is desired, ask your health care provider about options that will be safe while breastfeeding your baby. Where to find more information: [employer]: www.llli.org Contact a health care provider if:  You feel like you want to stop breastfeeding or have become frustrated with breastfeeding.  Your nipples are cracked or bleeding.  Your breasts are red, tender, or warm.  You have: ? Painful breasts or nipples. ? A swollen area on either breast. ? A fever or chills. ? Nausea or vomiting. ? Drainage other than breast milk from your nipples.  Your breasts do not become full before feedings by the fifth day after you give birth.  You feel sad and depressed.  Your baby is: ? Too sleepy to eat well. ? Having trouble sleeping. ? More than 73 week old and wetting fewer than 6 diapers in a 24-hour period. ? Not gaining weight by 3 days of age.  Your baby has fewer than 3 stools in a 24-hour period.  Your baby's skin or the white parts of his or her eyes become yellow. Get help right away if:  Your baby is overly tired (lethargic) and does not want to wake up and feed.  Your baby develops an unexplained fever. Summary  Breastfeeding offers many health benefits for infant and mothers.  Try to breastfeed your infant when he or she shows early signs of hunger.  Gently tickle or stroke your baby's lips with your finger or nipple to allow the baby to open his or her mouth. Bring the baby to your breast. Make sure that much of the areola is in your baby's mouth. Offer one side and burp the baby before you offer the other side.  Talk with your health care provider or lactation consultant if you have questions or you face problems as you breastfeed. This information is not intended to replace advice given to you by your health care provider. Make sure you discuss any questions you have with your health care provider. Document Revised: 02/10/2018 Document Reviewed:  12/18/2016 Elsevier Patient Education  2020 Else[employer] Inc.  Carpal Tunnel Syndrome  Carpal tunnel syndrome is a condition that causes pain in your hand and arm. The carpal tunnel is a narrow area that is on the palm side of your wrist.  Repeated wrist motion or certain diseases may cause swelling in the tunnel. This swelling can pinch the main nerve in the wrist (median nerve). What are the causes? This condition may be caused by:  Repeated wrist motions.  Wrist injuries.  Arthritis.  A sac of fluid (cyst) or abnormal growth (tumor) in the carpal tunnel.  Fluid buildup during pregnancy. Sometimes the cause is not known. What increases the risk? The following factors may make you more likely to develop this condition:  Having a job in which you move your wrist in the same way many times. This includes jobs like being a Midwife or a Conservation officer, nature.  Being a woman.  Having other health conditions, such as: ? Diabetes. ? Obesity. ? A thyroid gland that is not active enough (hypothyroidism). ? Kidney failure. What are the signs or symptoms? Symptoms of this condition include:  A tingling feeling in your fingers.  Tingling or a loss of feeling (numbness) in your hand.  Pain in your entire arm. This pain may get worse when you bend your wrist and elbow for a long time.  Pain in your wrist that goes up your arm to your shoulder.  Pain that goes down into your palm or fingers.  A weak feeling in your hands. You may find it hard to grab and hold items. You may feel worse at night. How is this diagnosed? This condition is diagnosed with a medical history and physical exam. You may also have tests, such as:  Electromyogram (EMG). This test checks the signals that the nerves send to the muscles.  Nerve conduction study. This test checks how well signals pass through your nerves.  Imaging tests, such as X-rays, ultrasound, and MRI. These tests check for what might be the cause of your  condition. How is this treated? This condition may be treated with:  Lifestyle changes. You will be asked to stop or change the activity that caused your problem.  Doing exercise and activities that make bones and muscles stronger (physical therapy).  Learning how to use your hand again (occupational therapy).  Medicines for pain and swelling (inflammation). You may have injections in your wrist.  A wrist splint.  Surgery. Follow these instructions at home: If you have a splint:  Wear the splint as told by your doctor. Remove it only as told by your doctor.  Loosen the splint if your fingers: ? Tingle. ? Lose feeling (become numb). ? Turn cold and blue.  Keep the splint clean.  If the splint is not waterproof: ? Do not let it get wet. ? Cover it with a watertight covering when you take a bath or a shower. Managing pain, stiffness, and swelling   If told, put ice on the painful area: ? If you have a removable splint, remove it as told by your doctor. ? Put ice in a plastic bag. ? Place a towel between your skin and the bag. ? Leave the ice on for 20 minutes, 2-3 times per day. General instructions  Take over-the-counter and prescription medicines only as told by your doctor.  Rest your wrist from any activity that may cause pain. If needed, talk with your boss at work about changes that can help your wrist heal.  Do any exercises as told by your doctor, physical therapist, or occupational therapist.  Keep all follow-up visits as told by your doctor. This is important. Contact a doctor if:  You have new symptoms.  Medicine does not help your pain.  Your symptoms get worse. Get help right away if:  You have very bad numbness or tingling in your wrist or hand. Summary  Carpal tunnel syndrome is a condition that causes pain in your hand and arm.  It is often caused by repeated wrist motions.  Lifestyle changes and medicines are used to treat this problem.  Surgery may help in very bad cases.  Follow your doctor's instructions about wearing a splint, resting your wrist, keeping follow-up visits, and calling for help. This information is not intended to replace advice given to you by your health care provider. Make sure you discuss any questions you have with your health care provider. Document Revised: 03/25/2018 Document Reviewed: 03/25/2018 Elsevier Patient Education  2020 Elsevier Inc.  Colposcopy, Care After This sheet gives you information about how to care for yourself after your procedure. Your doctor may also give you more specific instructions. If you have problems or questions, contact your doctor. What can I expect after the procedure? If you did not have a tissue sample removed (did not have a biopsy), you may only have some spotting for a few days. You can go back to your normal activities. If you had a tissue sample removed, it is common to have:  Soreness and pain. This may last for a few days.  Light-headedness.  Mild bleeding from your vagina or dark-colored, grainy discharge from your vagina. This may last for a few days. You may need to wear a sanitary pad.  Spotting for at least 48 hours after the procedure. Follow these instructions at home:   Take over-the-counter and prescription medicines only as told by your doctor. Ask your doctor what medicines you can start taking again. This is very important if you take blood-thinning medicine.  Do not drive or use heavy machinery while taking prescription pain medicine.  For 3 days, or as long as your doctor tells you, avoid: ? Douching. ? Using tampons. ? Having sex.  If you use birth control (contraception), keep using it.  Limit activity for the first day after the procedure. Ask your doctor what activities are safe for you.  It is up to you to get the results of your procedure. Ask your doctor when your results will be ready.  Keep all follow-up visits as told by  your doctor. This is important. Contact a doctor if:  You get a skin rash. Get help right away if:  You are bleeding a lot from your vagina. It is a lot of bleeding if you are using more than one pad an hour for 2 hours in a row.  You have clumps of blood (blood clots) coming from your vagina.  You have a fever.  You have chills  You have pain in your lower belly (pelvic area).  You have signs of infection, such as vaginal discharge that is: ? Different than usual. ? Yellow. ? Bad-smelling.  You have very pain or cramps in your lower belly that do not get better with medicine.  You feel light-headed.  You feel dizzy.  You pass out (faint). Summary  If you did not have a tissue sample removed (did not have a biopsy), you may only have some spotting for a few days. You can go back to your normal activities.  If you had a tissue sample removed, it is common to have mild pain and spotting for 48 hours.  For 3 days, or as long as your doctor tells you, avoid douching, using tampons and  having sex.  Get help right away if you have bleeding, very bad pain, or signs of infection. This information is not intended to replace advice given to you by your health care provider. Make sure you discuss any questions you have with your health care provider. Document Revised: 10/29/2017 Document Reviewed: 08/05/2016 Elsevier Patient Education  [place].

## 2020-05-03 NOTE — Progress Notes (Signed)
   PRENATAL VISIT NOTE  Subjective:  Jackie Newman is a 29 y.o. G2P0010 at [redacted]w[redacted]d being seen today for ongoing prenatal care.  She is currently monitored for the following issues for this high-risk pregnancy and has Morning sickness; Supervision of other normal pregnancy, antepartum; Trichomonas vaginalis (TV) infection; Chronic hypertension affecting pregnancy; and LGSIL on Pap smear of cervix on their problem list.  Patient reports no complaints.  Contractions: Not present. Vag. Bleeding: None.  Movement: Absent. Denies leaking of fluid.   The following portions of the patient's history were reviewed and updated as appropriate: allergies, current medications, past family history, past medical history, past social history, past surgical history and problem list.   Objective:   Vitals:   05/02/20 1407  BP: 118/66  Pulse: 79  Weight: 188 lb (85.3 kg)    Fetal Status: Fetal Heart Rate (bpm): 155   Movement: Absent     General:  Alert, oriented and cooperative. Patient is in no acute distress.  Skin: Skin is warm and dry. No rash noted.   Cardiovascular: Normal heart rate noted  Respiratory: Normal respiratory effort, no problems with respiration noted  Abdomen: Soft, gravid, appropriate for gestational age.  Pain/Pressure: Present     Pelvic: Cervical exam deferred        Extremities: Normal range of motion.  Edema: None  Mental Status: Normal mood and affect. Normal behavior. Normal judgment and thought content.   Procedure: Abnormal pap smear which showed LGSIL.  Patient given informed consent, signed copy in the chart, time out was performed.  Placed in lithotomy position. Cervix viewed with speculum and colposcope after application of acetic acid.   Colposcopy adequate?  yes Acetowhite lesions? none Punctation? none Mosaicism?  none Abnormal vasculature?  none Biopsies? none ECC? none  Assessment and Plan:  Pregnancy: G2P0010 at [redacted]w[redacted]d 1. Trichomonas vaginalis (TV)  infection S/p treatment TOC negative  2. Supervision of other normal pregnancy, antepartum Has f/u anatomy u/s scheduled  3. Intrauterine pregnancy   4. Morning sickness improved  5. Chronic hypertension affecting pregnancy BP is well controlled today on Labetalol On ASA  6. LGSIL on Pap smear of cervix S/p colpo--appears normal--f/u pp  General obstetric precautions including but not limited to vaginal bleeding, contractions, leaking of fluid and fetal movement were reviewed in detail with the patient. Please refer to After Visit Summary for other counseling recommendations.   Return in 4 weeks (on 05/30/2020).  Future Appointments  Date Time Provider Department Center  05/20/2020  3:30 PM [facility] NURSE [facility]  05/20/2020  3:30 PM [facility] US3 [facility]  05/30/2020  1:10 PM Raelyn Mora, CNM [facility] None    Reva Bores, MD

## 2020-05-06 ENCOUNTER — Other Ambulatory Visit: Payer: Self-pay

## 2020-05-06 ENCOUNTER — Inpatient Hospital Stay (HOSPITAL_COMMUNITY)
Admission: AD | Admit: 2020-05-06 | Discharge: 2020-05-06 | Disposition: A | Discharge status: Home / Self Care | Payer: Medicaid Other | Attending: Obstetrics and Gynecology | Admitting: Obstetrics and Gynecology

## 2020-05-06 ENCOUNTER — Encounter (HOSPITAL_COMMUNITY): Payer: Self-pay | Admitting: Obstetrics and Gynecology

## 2020-05-06 ENCOUNTER — Inpatient Hospital Stay (HOSPITAL_BASED_OUTPATIENT_CLINIC_OR_DEPARTMENT_OTHER): Payer: Medicaid Other

## 2020-05-06 DIAGNOSIS — Z3A21 21 weeks gestation of pregnancy: Secondary | ICD-10-CM

## 2020-05-06 DIAGNOSIS — O26892 Other specified pregnancy related conditions, second trimester: Secondary | ICD-10-CM

## 2020-05-06 DIAGNOSIS — Z87891 Personal history of nicotine dependence: Secondary | ICD-10-CM | POA: Diagnosis not present

## 2020-05-06 DIAGNOSIS — O9A212 Injury, poisoning and certain other consequences of external causes complicating pregnancy, second trimester: Secondary | ICD-10-CM

## 2020-05-06 DIAGNOSIS — Z7982 Long term (current) use of aspirin: Secondary | ICD-10-CM | POA: Diagnosis not present

## 2020-05-06 DIAGNOSIS — T1490XA Injury, unspecified, initial encounter: Secondary | ICD-10-CM | POA: Diagnosis not present

## 2020-05-06 DIAGNOSIS — W109XXA Fall (on) (from) unspecified stairs and steps, initial encounter: Secondary | ICD-10-CM | POA: Insufficient documentation

## 2020-05-06 DIAGNOSIS — O9A219 Injury, poisoning and certain other consequences of external causes complicating pregnancy, unspecified trimester: Secondary | ICD-10-CM

## 2020-05-06 DIAGNOSIS — Z881 Allergy status to other antibiotic agents status: Secondary | ICD-10-CM | POA: Diagnosis not present

## 2020-05-06 DIAGNOSIS — R102 Pelvic and perineal pain: Secondary | ICD-10-CM | POA: Insufficient documentation

## 2020-05-06 DIAGNOSIS — R109 Unspecified abdominal pain: Secondary | ICD-10-CM

## 2020-05-06 DIAGNOSIS — Z79899 Other long term (current) drug therapy: Secondary | ICD-10-CM | POA: Diagnosis not present

## 2020-05-06 LAB — URINALYSIS, ROUTINE W REFLEX MICROSCOPIC
Bilirubin Urine: NEGATIVE
Glucose, UA: NEGATIVE mg/dL
Hgb urine dipstick: NEGATIVE
Ketones, ur: 5 mg/dL — AB
Nitrite: NEGATIVE
Protein, ur: NEGATIVE mg/dL
Specific Gravity, Urine: 1.018 (ref 1.005–1.030)
pH: 5 (ref 5.0–8.0)

## 2020-05-06 NOTE — MAU Note (Signed)
.   Jackie Newman is a 29 y.o. at [redacted]w[redacted]d here in MAU reporting:she fell on Friday and has been having lower abdominal cramping since the fall. Denies any VB or abnormal discharge. Scraps on arms, hand and shoulder from catching her fall. Denies landing on her abdomen  Onset of complaint: 05/03/20 Pain score:5 Vitals:   05/06/20 1649  BP: 134/73  Pulse: 92  Resp: 16  Temp: 98.2 F (36.8 C)  SpO2: 100%     FHT:155 Lab orders placed from triage:

## 2020-05-06 NOTE — Discharge Instructions (Signed)
Preventing Injuries During Pregnancy Trauma is the most common cause of injury and death in pregnant women. This can also result in serious harm to the baby or even death. How can injuries affect my pregnancy? Your baby is protected in the womb (uterus) by a sac filled with fluid (amniotic sac). Your baby can be harmed if there is a direct blow to your abdomen and pelvis. Trauma may be caused by:  Falls. These are more common in the second and third trimester of pregnancy.  Automobile accidents.  Domestic violence or assault.  Severe burns, such as from fire or electricity. These injuries can result in:  Tearing of your uterus.  The placenta pulling away from the wall of the uterus (placental abruption).  The amniotic sac breaking open (rupture of membranes).  Blockage or decrease in the blood supply to your baby.  Going into labor earlier than expected.  Severe injuries to other parts of your body, such as your brain, spine, heart, lungs, or other organs. Minor falls and low-impact automobile accidents do not usually harm your baby, even if they cause a little harm to you. What can I do to lower my risk? Safety  Remove slippery rugs and loose objects on the floor. They increase your risk of tripping or slipping.  Wear comfortable shoes that have a good grip on the sole. Do not wear high-heeled shoes.  Always wear your seat belt properly when riding in a car. Use both the lap and shoulder belt, with the lap belt below your abdomen. Always practice safe driving. Do not ride on a motorcycle while pregnant. Activity  Avoid walking on wet or slippery floors.  Do not participate in rough and violent activities or sports.  Avoid high-risk situations and activities such as: ? Lifting heavy pots of boiling or hot liquids. ? Fixing electrical problems. ? Being near fires or starting fires. General instructions  Take over-the-counter and prescription medicines only as told by your  health care provider.  Know your blood type and the father's blood type in case you develop vaginal bleeding or experience an injury for which a blood transfusion is needed.  Spousal abuse can be a serious cause of trauma during pregnancy. If you are a victim of domestic violence or assault: ? Call your local emergency services (911 in the [place] ? Contact the National Domestic Violence Hotline for help and support. When should I seek immediate medical care? Get help right away if:  You fall on your abdomen or experience any serious blow to your abdomen.  You develop stiffness in your neck or pain after a fall or from other trauma.  You develop a headache or vision problems after a fall or from other trauma.  You do not feel the baby moving after a fall or trauma, or you feel that the baby is not moving as much as before the fall or trauma.  You have been the victim of domestic violence or any other kind of physical attack.  You have been in a car accident.  You develop vaginal bleeding.  You have fluid leaking from the vagina.  You develop uterine contractions. Symptoms include pelvic cramping, pain, or serious low back pain.  You become weak, faint, or have uncontrolled vomiting after trauma.  You have a serious burn. This includes burns to the face, neck, hands, or genitals, or burns greater than the size of your palm anywhere else. Summary  Trauma is the most common cause of injury and death   in pregnant women and can also lead to injury or death of the baby.  Falls, automobile accidents, domestic violence or assault, and severe burns can injure you or your baby. Make sure to get medical help right away if you experience any of these during your pregnancy.  Take steps to prevent slips or falls in your home, such as avoiding slippery floors and removing loose rugs.  Always wear your seat belt properly when riding in a car. Practice safe driving. This information is not  intended to replace advice given to you by your health care provider. Make sure you discuss any questions you have with your health care provider. Document Revised: 03/16/2019 Document Reviewed: 11/25/2016 Elsevier Patient Education  2020 [employer]  

## 2020-05-06 NOTE — MAU Provider Note (Signed)
Chief Complaint: Fall   First Provider Initiated Contact with Patient 05/06/20 1725     SUBJECTIVE HPI: Jackie Newman is a 29 y.o. G2P0010 at [redacted]w[redacted]d who presents to Maternity Admissions reporting abdominal pain after a fall. States she fell on Friday. Was walking up the stairs with her groceries when she tripped up a stair then slid down to the bottom of the stairs. Caught herself with her hands and did not hit her abdomen. Since then has had intermittent abdominal cramping. Tried treating with soaks in the tub and took 1 ES tylenol on Friday without relief. Denies dysuria, vaginal bleeding, vaginal discharge, or LOF. Has not felt fetal movement yet with the pregnancy.   Location: abdomen Quality: cramping Severity: 5/10 on pain scale Duration: 3 days Timing: intermittent Modifying factors: none Associated signs and symptoms: none  Past Medical History:  Diagnosis Date   Asthma    Congenital umbilical hernia    Ovarian cyst    Seizures ([place])    No follow up. Patient stated she had one seizure during her 23st birthday drinking alcohol.   OB History  Gravida Para Term Preterm AB Living  2       1    SAB TAB Ectopic Multiple Live Births  1            # Outcome Date GA Lbr Len/2nd Weight Sex Delivery Anes PTL Lv  2 Current           1 SAB 2015           Past Surgical History:  Procedure Laterality Date   COLONOSCOPY WITH ESOPHAGOGASTRODUODENOSCOPY (EGD)  12/04/2019   Social History   Socioeconomic History   Marital status: Single    Spouse name: Not on file   Number of children: Not on file   Years of education: Not on file   Highest education level: High school graduate  Occupational History   Not on file  Tobacco Use   Smoking status: Former Smoker    Packs/day: 5.00    Types: Cigars   Smokeless tobacco: Never Used  Substance and Sexual Activity   Alcohol use: No    Comment: occasional socially   Drug use: Not Currently    Types: Marijuana     Comment: quit 3-4 weeks ago   Sexual activity: Yes    Birth control/protection: None    Comment: last ilntercourse 6 weeks ago  Other Topics Concern   Not on file  Social History Narrative   Not on file   Social Determinants of Health   Financial Resource Strain: Low Risk    Difficulty of Paying Living Expenses: Not hard at all  Food Insecurity: No Food Insecurity   Worried About Charity fundraiser in the Last Year: Never true   [place] in the Last Year: Never true  Transportation Needs: No Transportation Needs   Lack of Transportation (Medical): No   Lack of Transportation (Non-Medical): No  Physical Activity:    Days of Exercise per Week:    Minutes of Exercise per Session:   Stress:    Feeling of Stress :   Social Connections:    Frequency of Communication with Friends and Family:    Frequency of Social Gatherings with Friends and Family:    Attends Religious Services:    Active Member of Clubs or Organizations:    Attends Archivist Meetings:    Marital Status:   Intimate Partner Violence: Not At Risk  Fear of Current or Ex-Partner: No   Emotionally Abused: No   Physically Abused: No   Sexually Abused: No   Family History  Problem Relation Age of Onset   Asthma Father    Gout Father    No current facility-administered medications on file prior to encounter.   Current Outpatient Medications on File Prior to Encounter  Medication Sig Dispense Refill   aspirin 81 MG chewable tablet Chew 1 tablet (81 mg total) by mouth daily. 30 tablet 6   Blood Pressure Monitoring (BLOOD PRESSURE MONITOR AUTOMAT) DEVI 1 Device by Does not apply route daily. Automatic blood pressure cuff regular size. To monitor blood pressure regularly at home. ICD-10 code: O09.90 1 each 0   Elastic Bandages & Supports (WRIST SPLINT/COCK-UP/LEFT L) MISC 1 Device by Does not apply route 3 times/day as needed-between meals & bedtime. 1 each 0   Elastic  Bandages & Supports (WRIST SPLINT/COCK-UP/RIGHT L) MISC 1 Device by Does not apply route as needed. 1 each 0   labetalol (NORMODYNE) 200 MG tablet Take 1 tablet (200 mg total) by mouth 2 (two) times daily. 60 tablet 3   Misc. Devices (GOJJI WEIGHT SCALE) MISC 1 Device by Does not apply route daily as needed. To weight self daily as needed at home. ICD-10 code: O09.90 1 each 0   terconazole (TERAZOL 7) 0.4 % vaginal cream Place 1 applicator vaginally at bedtime. 45 g 0   albuterol (VENTOLIN HFA) 108 (90 Base) MCG/ACT inhaler Inhale 1-2 puffs into the lungs every 6 (six) hours as needed for wheezing or shortness of breath. 18 g 3   Allergies  Allergen Reactions   Doxycycline Nausea And Vomiting    I have reviewed patient's Past Medical Hx, Surgical Hx, Family Hx, Social Hx, medications and allergies.   Review of Systems  Constitutional: Negative.   Gastrointestinal: Positive for abdominal pain. Negative for constipation, diarrhea, nausea and vomiting.  Genitourinary: Negative.     OBJECTIVE Patient Vitals for the past 24 hrs:  BP Temp Pulse Resp SpO2 Height Weight  05/06/20 1805 (!) 141/78 -- 82 16 -- -- --  05/06/20 1649 134/73 98.2 F (36.8 C) 92 16 100 % 5\' 4"  (1.626 m) 85.3 kg   Constitutional: Well-developed, well-nourished female in no acute distress.  Cardiovascular: normal rate & rhythm, no murmur Respiratory: normal rate and effort. Lung sounds clear throughout GI: Abd soft, non-tender, Pos BS x 4. No guarding or rebound tenderness MS: Extremities nontender, no edema, normal ROM Neurologic: Alert and oriented x 4.  GU:     Dilation: Closed Effacement (%): Thick Cervical Position: Posterior Exam by:: [email or web address] NP   LAB RESULTS Results for orders placed or performed during the hospital encounter of 05/06/20 (from the past 24 hour(s))  Urinalysis, Routine w reflex microscopic     Status: Abnormal   Collection Time: 05/06/20  5:04 PM  Result Value Ref Range    Color, Urine YELLOW YELLOW   APPearance HAZY (A) CLEAR   Specific Gravity, Urine 1.018 1.005 - 1.030   pH 5.0 5.0 - 8.0   Glucose, UA NEGATIVE NEGATIVE mg/dL   Hgb urine dipstick NEGATIVE NEGATIVE   Bilirubin Urine NEGATIVE NEGATIVE   Ketones, ur 5 (A) NEGATIVE mg/dL   Protein, ur NEGATIVE NEGATIVE mg/dL   Nitrite NEGATIVE NEGATIVE   Leukocytes,Ua TRACE (A) NEGATIVE   RBC / HPF 0-5 0 - 5 RBC/hpf   WBC, UA 6-10 0 - 5 WBC/hpf   Bacteria, UA FEW (A) NONE  SEEN   Squamous Epithelial / LPF 6-10 0 - 5   Mucus PRESENT    Ca Oxalate Crys, UA PRESENT     IMAGING No results found.  MAU COURSE Orders Placed This Encounter  Procedures   Culture, OB Urine   [place] MFM OB LIMITED   Urinalysis, Routine w reflex microscopic   Discharge patient   No orders of the defined types were placed in this encounter.   MDM FHT present via doppler Cervix closed/thick Abdomen soft, some tenderness to palpation throughout lower abdomen Limited ultrasound performed -- no evidence of abruption  ASSESSMENT 1. Injury affecting pregnancy   2. [redacted] weeks gestation of pregnancy   3. Abdominal pain during pregnancy in second trimester     PLAN Discharge home in stable condition. Discussed reasons to return to MAU Tylenol prn   Allergies as of 05/06/2020      Reactions   Doxycycline Nausea And Vomiting      Medication List    STOP taking these medications   cyclobenzaprine 10 MG tablet Commonly known as: FLEXERIL     TAKE these medications   albuterol 108 (90 Base) MCG/ACT inhaler Commonly known as: VENTOLIN HFA Inhale 1-2 puffs into the lungs every 6 (six) hours as needed for wheezing or shortness of breath.   aspirin 81 MG chewable tablet Chew 1 tablet (81 mg total) by mouth daily.   Blood Pressure Monitor Automat Devi 1 Device by Does not apply route daily. Automatic blood pressure cuff regular size. To monitor blood pressure regularly at home. ICD-10 code: O09.90   Gojji Weight Scale  Misc 1 Device by Does not apply route daily as needed. To weight self daily as needed at home. ICD-10 code: O09.90   labetalol 200 MG tablet Commonly known as: NORMODYNE Take 1 tablet (200 mg total) by mouth 2 (two) times daily.   terconazole 0.4 % vaginal cream Commonly known as: TERAZOL 7 Place 1 applicator vaginally at bedtime.   Wrist Splint/Cock-Up/Left L Misc 1 Device by Does not apply route 3 times/day as needed-between meals & bedtime.   Wrist Splint/Cock-Up/Right L Misc 1 Device by Does not apply route as needed.        Judeth Horn, NP 05/06/2020  6:27 PM

## 2020-05-07 ENCOUNTER — Telehealth: Payer: Self-pay | Admitting: *Deleted

## 2020-05-07 LAB — CULTURE, OB URINE

## 2020-05-07 NOTE — Telephone Encounter (Signed)
Called patient this AM regarding blood pressure. Received message from baby scripts stating BP reading was 155/103. Per patient that was taken yesterday 05/06/20. Patient reported that she took her BP medication this AM but not blood pressure reading. Patient is currently at work. Advised patient to call nurse call or send Mychart message with blood pressure reading. Patient denies any symptoms  Clovis Pu, RN

## 2020-05-18 ENCOUNTER — Inpatient Hospital Stay (HOSPITAL_COMMUNITY)
Admission: AD | Admit: 2020-05-18 | Discharge: 2020-05-18 | Disposition: A | Discharge status: Home / Self Care | Payer: Medicaid Other | Attending: Obstetrics and Gynecology | Admitting: Obstetrics and Gynecology

## 2020-05-18 ENCOUNTER — Encounter (HOSPITAL_COMMUNITY): Payer: Self-pay | Admitting: Obstetrics and Gynecology

## 2020-05-18 ENCOUNTER — Other Ambulatory Visit: Payer: Self-pay

## 2020-05-18 DIAGNOSIS — R1032 Left lower quadrant pain: Secondary | ICD-10-CM | POA: Insufficient documentation

## 2020-05-18 DIAGNOSIS — O26899 Other specified pregnancy related conditions, unspecified trimester: Secondary | ICD-10-CM

## 2020-05-18 DIAGNOSIS — R519 Headache, unspecified: Secondary | ICD-10-CM | POA: Insufficient documentation

## 2020-05-18 DIAGNOSIS — J45909 Unspecified asthma, uncomplicated: Secondary | ICD-10-CM | POA: Diagnosis not present

## 2020-05-18 DIAGNOSIS — R110 Nausea: Secondary | ICD-10-CM | POA: Diagnosis not present

## 2020-05-18 DIAGNOSIS — Z87891 Personal history of nicotine dependence: Secondary | ICD-10-CM | POA: Insufficient documentation

## 2020-05-18 DIAGNOSIS — Z881 Allergy status to other antibiotic agents status: Secondary | ICD-10-CM | POA: Insufficient documentation

## 2020-05-18 DIAGNOSIS — O10912 Unspecified pre-existing hypertension complicating pregnancy, second trimester: Secondary | ICD-10-CM | POA: Diagnosis not present

## 2020-05-18 DIAGNOSIS — R109 Unspecified abdominal pain: Secondary | ICD-10-CM | POA: Diagnosis not present

## 2020-05-18 DIAGNOSIS — Z3A22 22 weeks gestation of pregnancy: Secondary | ICD-10-CM | POA: Insufficient documentation

## 2020-05-18 DIAGNOSIS — O99512 Diseases of the respiratory system complicating pregnancy, second trimester: Secondary | ICD-10-CM | POA: Diagnosis not present

## 2020-05-18 DIAGNOSIS — Z79899 Other long term (current) drug therapy: Secondary | ICD-10-CM | POA: Insufficient documentation

## 2020-05-18 DIAGNOSIS — O26892 Other specified pregnancy related conditions, second trimester: Secondary | ICD-10-CM | POA: Diagnosis present

## 2020-05-18 DIAGNOSIS — Z7982 Long term (current) use of aspirin: Secondary | ICD-10-CM | POA: Diagnosis not present

## 2020-05-18 DIAGNOSIS — R1031 Right lower quadrant pain: Secondary | ICD-10-CM | POA: Insufficient documentation

## 2020-05-18 HISTORY — DX: Essential (primary) hypertension: I10

## 2020-05-18 LAB — URINALYSIS, ROUTINE W REFLEX MICROSCOPIC
Bilirubin Urine: NEGATIVE
Glucose, UA: NEGATIVE mg/dL
Hgb urine dipstick: NEGATIVE
Ketones, ur: NEGATIVE mg/dL
Nitrite: NEGATIVE
Protein, ur: NEGATIVE mg/dL
Specific Gravity, Urine: 1.024 (ref 1.005–1.030)
pH: 5 (ref 5.0–8.0)

## 2020-05-18 MED ORDER — BUTALBITAL-APAP-CAFFEINE 50-325-40 MG PO TABS: 1.0000 | ORAL_TABLET | Freq: Four times a day (QID) | ORAL | 0 refills | Status: DC | PRN

## 2020-05-18 MED ORDER — ACETAMINOPHEN 500 MG PO TABS
1000.0000 mg | ORAL_TABLET | Freq: Once | ORAL | Status: AC
  Administered 2020-05-18: 1000 mg via ORAL
  Filled 2020-05-18: qty 2

## 2020-05-18 MED ORDER — ONDANSETRON 4 MG PO TBDP
8.0000 mg | ORAL_TABLET | Freq: Once | ORAL | Status: AC
  Administered 2020-05-18: 8 mg via ORAL
  Filled 2020-05-18: qty 2

## 2020-05-18 NOTE — MAU Note (Signed)
Jackie Newman is a 29 y.o. at [redacted]w[redacted]d here in MAU reporting: bad lower abdominal pain, nausea, and a migraine since yesterday. Has not vomited. No bleeding or LOF.   Onset of complaint: yesterday  Pain score: abdomen 6/10, headache 5/10  Vitals:   05/18/20 1211  BP: 108/67  Pulse: 91  Resp: 17  Temp: 98.2 F (36.8 C)  SpO2: 98%     FHT: 164  Lab orders placed from triage: UA, unable to give sample

## 2020-05-18 NOTE — MAU Provider Note (Signed)
History     CSN: [id number]  Arrival date and time: 05/18/20 1156   First Provider Initiated Contact with Patient 05/18/20 1237      Chief Complaint  Patient presents with  . Abdominal Pain  . Nausea  . Headache   HPI Jackie Newman is a 29 y.o. G2P0010 at [redacted]w[redacted]d who presents with bilateral lower abdominal pain, nausea and a headache. 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 Her headache started after work. She also reports the pain in her abdomen is worse with movement. She reports nausea and thinks it is because she didn't eat yesterday. She denies any leaking or bleeding. Reports feeling fetal movement.   OB History    Gravida  2   Para      Term      Preterm      AB  1   Living        SAB  1   TAB      Ectopic      Multiple      Live Births              Past Medical History:  Diagnosis Date  . Asthma   . Congenital umbilical hernia   . Hypertension   . Ovarian cyst   . Seizures ([place])    No follow up. Patient stated she had one seizure during her 23st birthday drinking alcohol.    Past Surgical History:  Procedure Laterality Date  . COLONOSCOPY WITH ESOPHAGOGASTRODUODENOSCOPY (EGD)  12/04/2019    Family History  Problem Relation Age of Onset  . Asthma Father   . Gout Father     Social History   Tobacco Use  . Smoking status: Former Smoker    Packs/day: 5.00    Types: Cigars  . Smokeless tobacco: Never Used  Vaping Use  . Vaping Use: Never used  Substance Use Topics  . Alcohol use: No    Comment: occasional socially  . Drug use: Not Currently    Types: Marijuana    Comment: quit 3-4 weeks ago    Allergies:  Allergies  Allergen Reactions  . Doxycycline Nausea And Vomiting    Medications Prior to Admission  Medication Sig Dispense Refill Last Dose  . albuterol (VENTOLIN HFA) 108 (90 Base) MCG/ACT inhaler Inhale 1-2 puffs into the lungs every 6 (six) hours as needed for  wheezing or shortness of breath. 18 g 3   . aspirin 81 MG chewable tablet Chew 1 tablet (81 mg total) by mouth daily. 30 tablet 6   . Blood Pressure Monitoring (BLOOD PRESSURE MONITOR AUTOMAT) DEVI 1 Device by Does not apply route daily. Automatic blood pressure cuff regular size. To monitor blood pressure regularly at home. ICD-10 code: O09.90 1 each 0   . Elastic Bandages & Supports (WRIST SPLINT/COCK-UP/LEFT L) MISC 1 Device by Does not apply route 3 times/day as needed-between meals & bedtime. 1 each 0   . Elastic Bandages & Supports (WRIST SPLINT/COCK-UP/RIGHT L) MISC 1 Device by Does not apply route as needed. 1 each 0   . labetalol (NORMODYNE) 200 MG tablet Take 1 tablet (200 mg total) by mouth 2 (two) times daily. 60 tablet 3   . Misc. Devices (GOJJI WEIGHT SCALE) MISC 1 Device by Does not apply route daily as needed. To weight self daily as needed at home. ICD-10 code: O09.90 1 each 0   . terconazole (TERAZOL 7) 0.4 %  vaginal cream Place 1 applicator vaginally at bedtime. 45 g 0     Review of Systems  Constitutional: Negative.  Negative for fatigue and fever.  HENT: Negative.   Respiratory: Negative.  Negative for shortness of breath.   Cardiovascular: Negative.  Negative for chest pain.  Gastrointestinal: Positive for abdominal pain and nausea. Negative for constipation, diarrhea and vomiting.  Genitourinary: Negative.  Negative for dysuria, vaginal bleeding and vaginal discharge.  Neurological: Positive for headaches. Negative for dizziness.   Physical Exam   Blood pressure 108/67, pulse 91, temperature 98.2 F (36.8 C), temperature source Oral, resp. rate 17, height 5\' 4"  (1.626 m), weight 87.6 kg, last menstrual period 12/11/2019, SpO2 98 %.  Physical Exam  Nursing note and vitals reviewed. Constitutional: She is oriented to person, place, and time. She appears well-developed. No distress.  HENT:  Head: Normocephalic.  Eyes: Pupils are equal, round, and reactive to light.   Cardiovascular: Normal rate, regular rhythm and normal heart sounds.  Respiratory: Effort normal and breath sounds normal. No respiratory distress.  GI: Soft. Bowel sounds are normal. She exhibits no distension. There is no abdominal tenderness.  Neurological: She is alert and oriented to person, place, and time.  Skin: Skin is warm and dry.  Psychiatric: Her behavior is normal. Judgment and thought content normal.   FHT: 164 bpm  Dilation: Closed Effacement (%): Thick Cervical Position: Posterior Exam by:: Zhane Donlan cnm  MAU Course  Procedures Results for orders placed or performed during the hospital encounter of 05/18/20 (from the past 24 hour(s))  Urinalysis, Routine w reflex microscopic     Status: Abnormal   Collection Time: 05/18/20 12:25 PM  Result Value Ref Range   Color, Urine YELLOW YELLOW   APPearance HAZY (A) CLEAR   Specific Gravity, Urine 1.024 1.005 - 1.030   pH 5.0 5.0 - 8.0   Glucose, UA NEGATIVE NEGATIVE mg/dL   Hgb urine dipstick NEGATIVE NEGATIVE   Bilirubin Urine NEGATIVE NEGATIVE   Ketones, ur NEGATIVE NEGATIVE mg/dL   Protein, ur NEGATIVE NEGATIVE mg/dL   Nitrite NEGATIVE NEGATIVE   Leukocytes,Ua TRACE (A) NEGATIVE   WBC, UA 0-5 0 - 5 WBC/hpf   Bacteria, UA RARE (A) NONE SEEN   Squamous Epithelial / LPF 0-5 0 - 5   Mucus PRESENT    MDM UA Patient is driving, unable to give strong pain medication Tylenol Zofran  Encouraged patient to wear pregnancy support belt. Discussed importance of small frequent snacks and meals during pregnancy and discussed need to significantly increase PO hydration.  Assessment and Plan   1. Abdominal pain affecting pregnancy   2. Pregnancy headache in second trimester    -Discharge home in stable condition -Rx for limited fioricet sent to patient's pharmacy -Abdominal pain precautions discussed -Patient advised to follow-up with [facility] on Monday as scheduled for prenatal care -Patient may return to MAU as  needed or if her condition were to change or worsen   Sunday CNM 05/18/2020, 12:37 PM

## 2020-05-18 NOTE — Discharge Instructions (Signed)

## 2020-05-19 ENCOUNTER — Inpatient Hospital Stay (HOSPITAL_COMMUNITY)
Admission: AD | Admit: 2020-05-19 | Discharge: 2020-05-19 | Disposition: A | Discharge status: Home / Self Care | Payer: Medicaid Other | Attending: Obstetrics and Gynecology | Admitting: Obstetrics and Gynecology

## 2020-05-19 ENCOUNTER — Encounter (HOSPITAL_COMMUNITY): Payer: Self-pay | Admitting: Obstetrics and Gynecology

## 2020-05-19 DIAGNOSIS — Z87891 Personal history of nicotine dependence: Secondary | ICD-10-CM | POA: Insufficient documentation

## 2020-05-19 DIAGNOSIS — K5900 Constipation, unspecified: Secondary | ICD-10-CM

## 2020-05-19 DIAGNOSIS — R109 Unspecified abdominal pain: Secondary | ICD-10-CM | POA: Diagnosis not present

## 2020-05-19 DIAGNOSIS — O99612 Diseases of the digestive system complicating pregnancy, second trimester: Secondary | ICD-10-CM | POA: Diagnosis not present

## 2020-05-19 DIAGNOSIS — O99512 Diseases of the respiratory system complicating pregnancy, second trimester: Secondary | ICD-10-CM | POA: Diagnosis not present

## 2020-05-19 DIAGNOSIS — J45909 Unspecified asthma, uncomplicated: Secondary | ICD-10-CM | POA: Insufficient documentation

## 2020-05-19 DIAGNOSIS — Z79899 Other long term (current) drug therapy: Secondary | ICD-10-CM | POA: Diagnosis not present

## 2020-05-19 DIAGNOSIS — R232 Flushing: Secondary | ICD-10-CM | POA: Insufficient documentation

## 2020-05-19 DIAGNOSIS — O10012 Pre-existing essential hypertension complicating pregnancy, second trimester: Secondary | ICD-10-CM | POA: Insufficient documentation

## 2020-05-19 DIAGNOSIS — O26892 Other specified pregnancy related conditions, second trimester: Secondary | ICD-10-CM

## 2020-05-19 DIAGNOSIS — Z7982 Long term (current) use of aspirin: Secondary | ICD-10-CM | POA: Diagnosis not present

## 2020-05-19 DIAGNOSIS — Z3A22 22 weeks gestation of pregnancy: Secondary | ICD-10-CM

## 2020-05-19 LAB — URINALYSIS, ROUTINE W REFLEX MICROSCOPIC
Bilirubin Urine: NEGATIVE
Glucose, UA: NEGATIVE mg/dL
Hgb urine dipstick: NEGATIVE
Ketones, ur: NEGATIVE mg/dL
Nitrite: NEGATIVE
Protein, ur: NEGATIVE mg/dL
Specific Gravity, Urine: 1.015 (ref 1.005–1.030)
pH: 7 (ref 5.0–8.0)

## 2020-05-19 LAB — WET PREP, GENITAL
Clue Cells Wet Prep HPF POC: NONE SEEN
Sperm: NONE SEEN
Trich, Wet Prep: NONE SEEN
Yeast Wet Prep HPF POC: NONE SEEN

## 2020-05-19 MED ORDER — TRAMADOL HCL 50 MG PO TABS
100.0000 mg | ORAL_TABLET | Freq: Once | ORAL | Status: AC
  Administered 2020-05-19: 100 mg via ORAL
  Filled 2020-05-19: qty 2

## 2020-05-19 NOTE — Discharge Instructions (Signed)

## 2020-05-19 NOTE — MAU Provider Note (Addendum)
Chief Complaint: Abdominal Pain   First Provider Initiated Contact with Patient 05/19/20 [phone]      SUBJECTIVE HPI: Jackie Newman is a 29 y.o. G2P0010 at [redacted]w[redacted]d by LMP who presents to maternity admissions reporting abdominal cramping that radiates to her rectum.  She reports vaginal irritation but denies discharge, itching, or odor. She is taking Tylenol and has not picked up Fioricet prescribed in MAU yesterday. She has not had anything to drink yet today and reports difficulty eating/drinking enough at work. She denies h/a today but is standing and rocking with abdominal pain.  She reports hot flashes throughout the day.   Location: lower abdomen and bilateral groin area Quality: cramping Severity: 10/10 on pain scale Duration: 2 days Timing: intermittent Modifying factors: Tylenol Associated signs and symptoms: vaginal irritation, hot flashes  HPI  Past Medical History:  Diagnosis Date  . Asthma   . Congenital umbilical hernia   . Hypertension   . Ovarian cyst   . Seizures ([place])    No follow up. Patient stated she had one seizure during her 23st birthday drinking alcohol.   Past Surgical History:  Procedure Laterality Date  . COLONOSCOPY WITH ESOPHAGOGASTRODUODENOSCOPY (EGD)  12/04/2019   Social History   Socioeconomic History  . Marital status: Single    Spouse name: Not on file  . Number of children: Not on file  . Years of education: Not on file  . Highest education level: High school graduate  Occupational History  . Not on file  Tobacco Use  . Smoking status: Former Smoker    Packs/day: 5.00    Types: Cigars  . Smokeless tobacco: Never Used  Vaping Use  . Vaping Use: Never used  Substance and Sexual Activity  . Alcohol use: No    Comment: occasional socially  . Drug use: Not Currently    Types: Marijuana    Comment: quit 3-4 weeks ago  . Sexual activity: Yes    Birth control/protection: None    Comment: last ilntercourse 6 weeks ago  Other Topics  Concern  . Not on file  Social History Narrative  . Not on file   Social Determinants of Health   Financial Resource Strain: Low Risk   . Difficulty of Paying Living Expenses: Not hard at all  Food Insecurity: No Food Insecurity  . Worried About Charity fundraiser in the Last Year: Never true  . Ran Out of Food in the Last Year: Never true  Transportation Needs: No Transportation Needs  . Lack of Transportation (Medical): No  . Lack of Transportation (Non-Medical): No  Physical Activity:   . Days of Exercise per Week:   . Minutes of Exercise per Session:   Stress:   . Feeling of Stress :   Social Connections:   . Frequency of Communication with Friends and Family:   . Frequency of Social Gatherings with Friends and Family:   . Attends Religious Services:   . Active Member of Clubs or Organizations:   . Attends Archivist Meetings:   Marland Kitchen Marital Status:   Intimate Partner Violence: Not At Risk  . Fear of Current or Ex-Partner: No  . Emotionally Abused: No  . Physically Abused: No  . Sexually Abused: No   No current facility-administered medications on file prior to encounter.   Current Outpatient Medications on File Prior to Encounter  Medication Sig Dispense Refill  . albuterol (VENTOLIN HFA) 108 (90 Base) MCG/ACT inhaler Inhale 1-2 puffs into the lungs every 6 (  six) hours as needed for wheezing or shortness of breath. 18 g 3  . aspirin 81 MG chewable tablet Chew 1 tablet (81 mg total) by mouth daily. 30 tablet 6  . Blood Pressure Monitoring (BLOOD PRESSURE MONITOR AUTOMAT) DEVI 1 Device by Does not apply route daily. Automatic blood pressure cuff regular size. To monitor blood pressure regularly at home. ICD-10 code: O09.90 1 each 0  . butalbital-acetaminophen-caffeine (FIORICET) 50-325-40 MG tablet Take 1-2 tablets by mouth every 6 (six) hours as needed for headache. 6 tablet 0  . Elastic Bandages & Supports (WRIST SPLINT/COCK-UP/LEFT L) MISC 1 Device by Does not  apply route 3 times/day as needed-between meals & bedtime. 1 each 0  . Elastic Bandages & Supports (WRIST SPLINT/COCK-UP/RIGHT L) MISC 1 Device by Does not apply route as needed. 1 each 0  . labetalol (NORMODYNE) 200 MG tablet Take 1 tablet (200 mg total) by mouth 2 (two) times daily. 60 tablet 3  . Misc. Devices (GOJJI WEIGHT SCALE) MISC 1 Device by Does not apply route daily as needed. To weight self daily as needed at home. ICD-10 code: O09.90 1 each 0  . terconazole (TERAZOL 7) 0.4 % vaginal cream Place 1 applicator vaginally at bedtime. 45 g 0   Allergies  Allergen Reactions  . Doxycycline Nausea And Vomiting    ROS:  Review of Systems  Constitutional: Negative for chills, fatigue and fever.  Respiratory: Negative for shortness of breath.   Cardiovascular: Negative for chest pain.  Gastrointestinal: Positive for abdominal pain and rectal pain. Negative for nausea and vomiting.  Genitourinary: Positive for pelvic pain. Negative for difficulty urinating, dysuria, flank pain, vaginal bleeding, vaginal discharge and vaginal pain.  Neurological: Negative for dizziness and headaches.  Psychiatric/Behavioral: Negative.      I have reviewed patient's Past Medical Hx, Surgical Hx, Family Hx, Social Hx, medications and allergies.   Physical Exam   Patient Vitals for the past 24 hrs:  BP Temp Temp src Pulse Resp SpO2  05/19/20 1120 (!) 113/53 -- -- 76 -- --  05/19/20 0916 98/66 98.3 F (36.8 C) Oral 90 18 100 %   Constitutional: Well-developed, well-nourished female in no acute distress.  Cardiovascular: normal rate Respiratory: normal effort GI: Abd soft, non-tender. Pos BS x 4 MS: Extremities nontender, no edema, normal ROM Neurologic: Alert and oriented x 4.  GU: Neg CVAT.    FHT 155 by doppler  Dilation: Closed Effacement (%): Thick Cervical Position: Anterior Exam by:: Sharen Counter, CNM   LAB RESULTS Results for orders placed or performed during the hospital  encounter of 05/19/20 (from the past 24 hour(s))  Urinalysis, Routine w reflex microscopic     Status: Abnormal   Collection Time: 05/19/20  9:15 AM  Result Value Ref Range   Color, Urine YELLOW YELLOW   APPearance HAZY (A) CLEAR   Specific Gravity, Urine 1.015 1.005 - 1.030   pH 7.0 5.0 - 8.0   Glucose, UA NEGATIVE NEGATIVE mg/dL   Hgb urine dipstick NEGATIVE NEGATIVE   Bilirubin Urine NEGATIVE NEGATIVE   Ketones, ur NEGATIVE NEGATIVE mg/dL   Protein, ur NEGATIVE NEGATIVE mg/dL   Nitrite NEGATIVE NEGATIVE   Leukocytes,Ua TRACE (A) NEGATIVE   RBC / HPF 0-5 0 - 5 RBC/hpf   WBC, UA 6-10 0 - 5 WBC/hpf   Bacteria, UA RARE (A) NONE SEEN   Squamous Epithelial / LPF 11-20 0 - 5   Mucus PRESENT   Wet prep, genital     Status: Abnormal  Collection Time: 05/19/20 10:11 AM   Specimen: Cervix; Genital  Result Value Ref Range   Yeast Wet Prep HPF POC NONE SEEN NONE SEEN   Trich, Wet Prep NONE SEEN NONE SEEN   Clue Cells Wet Prep HPF POC NONE SEEN NONE SEEN   WBC, Wet Prep HPF POC MODERATE (A) NONE SEEN   Sperm NONE SEEN     O/Positive/-- (04/07 1541)  IMAGING   MAU Management/MDM: Orders Placed This Encounter  Procedures  . Wet prep, genital  . Urinalysis, Routine w reflex microscopic  . CBC  . Comprehensive metabolic panel  . Discharge patient    Meds ordered this encounter  Medications  . traMADol (ULTRAM) tablet 100 mg    FHT wnl by doppler, cervix closed/thick/high so no evidence of preterm labor or other pregnancy complication. Pt with palpable stool during SVE so may be constipation causing pain.  Rx for Colace BID.  Pt cannot wear pregnancy support belt because she reports it "slides around and doesn't stay on".  She is lifting heavy things at work and on her feet 12 hours.  Note for basic pregnancy restrictions and note for pt to miss work x 2 days.  Tramadol dose given in MAU.   CBC, CMP pending, pt had to leave before labs drawn.  Message sent to [facility] to follow  up this week due to pain, hot flashes, and poor fitting maternity support belt.   Return precautions/PTL precautions reviewed.   ASSESSMENT 1. Abdominal pain during pregnancy, second trimester   2. Constipation during pregnancy in second trimester     PLAN Discharge home Allergies as of 05/19/2020      Reactions   Doxycycline Nausea And Vomiting      Medication List    TAKE these medications   albuterol 108 (90 Base) MCG/ACT inhaler Commonly known as: VENTOLIN HFA Inhale 1-2 puffs into the lungs every 6 (six) hours as needed for wheezing or shortness of breath.   aspirin 81 MG chewable tablet Chew 1 tablet (81 mg total) by mouth daily.   Blood Pressure Monitor Automat Devi 1 Device by Does not apply route daily. Automatic blood pressure cuff regular size. To monitor blood pressure regularly at home. ICD-10 code: O09.90   butalbital-acetaminophen-caffeine 50-325-40 MG tablet Commonly known as: FIORICET Take 1-2 tablets by mouth every 6 (six) hours as needed for headache.   Gojji Weight Scale Misc 1 Device by Does not apply route daily as needed. To weight self daily as needed at home. ICD-10 code: O09.90   labetalol 200 MG tablet Commonly known as: NORMODYNE Take 1 tablet (200 mg total) by mouth 2 (two) times daily.   terconazole 0.4 % vaginal cream Commonly known as: TERAZOL 7 Place 1 applicator vaginally at bedtime.   Wrist Splint/Cock-Up/Left L Misc 1 Device by Does not apply route 3 times/day as needed-between meals & bedtime.   Wrist Splint/Cock-Up/Right L Misc 1 Device by Does not apply route as needed.       Follow-up Information    [facility] Follow up.   Specialty: Obstetrics and Gynecology Why: As scheduled, return to MAU as needed for emergencies Contact information: [address] [place] [facility] [place] [address] [phone]              Sharen Counter Certified Nurse-Midwife 05/19/2020  12:08  PM

## 2020-05-19 NOTE — MAU Note (Signed)
Pt requesting to leave without blood work being drawn.  CNM made aware, CNM talked with pt about follow up.  DC papers reviewed. Pt verbalized understanding and signed dc papers

## 2020-05-19 NOTE — MAU Note (Signed)
Pt reports to mau with c/o constant lower abd pain and cramping.  Pt reports urinary urgency but states she only gets a "little urine out" each time she uses the bathroom.  Pt also reports some vaginal irritation since earlier this morning.  Pt reports she was in MAU yesterday and given RX for pain medicine but has not picked it up yet.  Pt has not taken any medication for pain today.  Denies vag bleeding or leaking of fluid.  FH tones 155

## 2020-05-20 ENCOUNTER — Other Ambulatory Visit: Payer: Self-pay

## 2020-05-20 ENCOUNTER — Encounter: Payer: Self-pay | Admitting: *Deleted

## 2020-05-20 ENCOUNTER — Ambulatory Visit: Payer: Medicaid Other | Attending: Obstetrics and Gynecology

## 2020-05-20 ENCOUNTER — Ambulatory Visit: Payer: Medicaid Other | Admitting: *Deleted

## 2020-05-20 DIAGNOSIS — Z3480 Encounter for supervision of other normal pregnancy, unspecified trimester: Secondary | ICD-10-CM | POA: Diagnosis present

## 2020-05-20 DIAGNOSIS — O402XX0 Polyhydramnios, second trimester, not applicable or unspecified: Secondary | ICD-10-CM

## 2020-05-20 DIAGNOSIS — O210 Mild hyperemesis gravidarum: Secondary | ICD-10-CM | POA: Diagnosis present

## 2020-05-20 DIAGNOSIS — O10919 Unspecified pre-existing hypertension complicating pregnancy, unspecified trimester: Secondary | ICD-10-CM | POA: Insufficient documentation

## 2020-05-20 DIAGNOSIS — A5901 Trichomonal vulvovaginitis: Secondary | ICD-10-CM | POA: Insufficient documentation

## 2020-05-20 DIAGNOSIS — O10912 Unspecified pre-existing hypertension complicating pregnancy, second trimester: Secondary | ICD-10-CM

## 2020-05-20 DIAGNOSIS — Z3A23 23 weeks gestation of pregnancy: Secondary | ICD-10-CM | POA: Diagnosis not present

## 2020-05-20 DIAGNOSIS — Z362 Encounter for other antenatal screening follow-up: Secondary | ICD-10-CM | POA: Diagnosis not present

## 2020-05-20 LAB — GC/CHLAMYDIA PROBE AMP (~~LOC~~) NOT AT ARMC
Chlamydia: NEGATIVE
Comment: NEGATIVE
Comment: NORMAL
Neisseria Gonorrhea: NEGATIVE

## 2020-05-21 ENCOUNTER — Telehealth: Payer: Self-pay | Admitting: General Practice

## 2020-05-21 NOTE — Telephone Encounter (Signed)
-----   Message from Hurshel Party, CNM sent at 05/19/2020 11:27 AM EDT ----- Regarding: follow up appt This G2P0 at 22 weeks has presented to MAU x 2 over the weekend with abdominal pain and headache.  We have treated her but she reports being so uncomfortable she cannot work. She has a maternity support belt but reports it does not fit, "it slides around" so she doesn't wear it.  Would it be possible to fit her in for an appointment this week and have her bring her support belt to see if we can get it to fit better and to follow up about her pain?  Thank you.

## 2020-05-21 NOTE — Telephone Encounter (Signed)
Called to schedule patient for follow up this week and patient stated that she would keep her appointment on 05/30/2020.  Instructed patient to bring maternity belt.  Pt verbalized understanding.

## 2020-05-22 ENCOUNTER — Other Ambulatory Visit: Payer: Self-pay | Admitting: *Deleted

## 2020-05-22 DIAGNOSIS — O10919 Unspecified pre-existing hypertension complicating pregnancy, unspecified trimester: Secondary | ICD-10-CM

## 2020-05-23 LAB — CULTURE, OB URINE

## 2020-05-30 ENCOUNTER — Encounter: Payer: Self-pay | Admitting: General Practice

## 2020-05-30 ENCOUNTER — Ambulatory Visit (INDEPENDENT_AMBULATORY_CARE_PROVIDER_SITE_OTHER): Payer: Medicaid Other | Admitting: Obstetrics and Gynecology

## 2020-05-30 ENCOUNTER — Other Ambulatory Visit: Payer: Self-pay

## 2020-05-30 VITALS — BP 119/72 | HR 69 | Temp 98.0°F | Wt 192.0 lb

## 2020-05-30 DIAGNOSIS — O10912 Unspecified pre-existing hypertension complicating pregnancy, second trimester: Secondary | ICD-10-CM

## 2020-05-30 DIAGNOSIS — O10919 Unspecified pre-existing hypertension complicating pregnancy, unspecified trimester: Secondary | ICD-10-CM

## 2020-05-30 DIAGNOSIS — O0992 Supervision of high risk pregnancy, unspecified, second trimester: Secondary | ICD-10-CM

## 2020-05-30 DIAGNOSIS — Z3A24 24 weeks gestation of pregnancy: Secondary | ICD-10-CM

## 2020-05-30 NOTE — Patient Instructions (Signed)
Polyhydramnios When a woman becomes pregnant, a sac forms around her growing baby. This sac, called the amniotic sac, is filled with fluid (amniotic fluid) that:  Cushions and protects the baby.  Helps the baby's lungs and gastrointestinal tract grow.  Allows the baby to move around, which helps the baby's muscles and bones develop. Polyhydramnios means that there is too much fluid in the sac. Babies born with polyhydramnios should be checked for birth defects. What are the causes? This condition may be caused by:  Diabetes in the mother.  The baby being larger than normal for the baby's age (large for gestational age).  Problems that prevent the fetus from swallowing amniotic fluid. These may include: ? An abnormal chromosome. ? Abnormality in the baby's intestinal tract. ? A birth defect in which the baby does not have a brain or parts of the brain (anencephaly).  A condition that affects twins, called twin-twin transfusion syndrome.  An illness in the mother, such as kidney or heart disease.  A tumor of the placenta (chorioangioma).  An infection in the baby. What are the signs or symptoms? Symptoms of this condition include:  A uterus that is larger than it should be for the stage of pregnancy.  Shortness of breath.  Increased feeling of pressure on the abdomen.  Increased swelling in the legs.  Preterm labor.  Preeclampsia. How is this diagnosed? This condition may be diagnosed based on:  A measurement of your abdomen. Your health care provider can diagnose the condition if he or she measures you and notices that your uterus is larger than it should be.  An ultrasound. This image may be taken by placing a device on your abdomen or into your vagina. The test can measure the amount of fluid in the amniotic sac (amniotic fluid index). It can also: ? Show if you are carrying twins or more. ? Measure the growth of the baby. ? Look for birth defects. How is this  treated? This condition is treated by removing fluid from the amniotic sac. This is done through a procedure where a needle is inserted through the skin into the uterus (amniocentesis). Follow these instructions at home:  Make sure to keep any medical conditions you have under control. If you have diabetes, talk to your health care provider about ways to manage your blood sugar.  Keep all your prenatal visits as told by your health care provider. It is important to follow your health care provider's recommendations. Contact a health care provider if:  You think your uterus has grown too fast in a short period of time.  You feel a great amount of pressure in your pelvis and are more uncomfortable than expected. Get help right away if:  You have a gush of fluid or are leaking fluid from your vagina.  You stop feeling the baby move.  You do not feel the baby kicking as much as usual.  You have diabetes and you have a hard time keeping it under control.  You have kidney or heart disease and it is causing you problems. Summary  Polyhydramnios means there is too much fluid in the amniotic sac. This can lead to birth defects.  This condition may be diagnosed based on a measurement of your abdomen or an ultrasound.  Keep all your prenatal visits as told by your health care provider. It is important to follow your health care provider's recommendations. This information is not intended to replace advice given to you by your health   care provider. Make sure you discuss any questions you have with your health care provider. Document Revised: 10/29/2017 Document Reviewed: 02/02/2017 Elsevier Patient Education  2020 Elsevier Inc.  

## 2020-05-30 NOTE — Progress Notes (Signed)
°  HIGH-RISK PREGNANCY OFFICE VISIT Patient name: Jackie Newman MRN [id number]  Date of birth: 1991/08/19 Chief Complaint:   Routine Prenatal Visit  History of Present Illness:   Jackie Newman is a 29 y.o. G8P0010 female at [redacted]w[redacted]d with an Estimated Date of Delivery: 09/16/20 being seen today for ongoing management of a high-risk pregnancy complicated by [facility] currently on Labetalol 200 mg BID Today she reports no complaints. Contractions: Not present. Vag. Bleeding: None.  Movement: Present. denies leaking of fluid.  Review of Systems:   Pertinent items are noted in HPI Denies abnormal vaginal discharge w/ itching/odor/irritation, headaches, visual changes, shortness of breath, chest pain, abdominal pain, severe nausea/vomiting, or problems with urination or bowel movements unless otherwise stated above. Pertinent History Reviewed:  Reviewed past medical,surgical, social, obstetrical and family history.  Reviewed problem list, medications and allergies. Physical Assessment:   Vitals:   05/30/20 1304  BP: 119/72  Pulse: 69  Temp: 98 F (36.7 C)  Weight: 192 lb (87.1 kg)  Body mass index is 32.96 kg/m.           Physical Examination:   General appearance: alert, well appearing, and in no distress and oriented to person, place, and time  Mental status: alert, oriented to person, place, and time, normal mood, behavior, speech, dress, motor activity, and thought processes  Skin: warm & dry   Extremities: Edema: None    Cardiovascular: normal heart rate noted  Respiratory: normal respiratory effort, no distress  Abdomen: gravid, soft, non-tender  Pelvic: Cervical exam deferred         Fetal Status: Fetal Heart Rate (bpm): 149 Fundal Height: 27 cm Movement: Present    Fetal Surveillance Testing today: none   No results found for this or any previous visit (from the past 24 hour(s)).  Assessment & Plan:  1) High-risk pregnancy G2P0010 at [redacted]w[redacted]d with an Estimated Date of Delivery:  09/16/20   2) Supervision of high risk pregnancy in second trimester - Discussed 2 hr GTT at [facility] - Advised to be fasting after midnight the night before appt, may drink water until time of appt   3) Chronic hypertension affecting pregnancy, stable - Continue Labetalol as prescribed  Meds: No orders of the defined types were placed in this encounter.   Labs/procedures today: none  Treatment Plan:  none  Reviewed: Preterm labor symptoms and general obstetric precautions including but not limited to vaginal bleeding, contractions, leaking of fluid and fetal movement were reviewed in detail with the patient.  All questions were answered. Has home bp cuff. Check bp weekly, let us know if >140/90.   Follow-up: Return in about 11 days (around 06/10/2020) for 2 hr GTT only.  No orders of the defined types were placed in this encounter.  Raelyn Mora MSN, CNM 05/30/2020 1:16 PM

## 2020-06-09 ENCOUNTER — Other Ambulatory Visit: Payer: Self-pay

## 2020-06-09 ENCOUNTER — Encounter (HOSPITAL_COMMUNITY): Payer: Self-pay | Admitting: Obstetrics and Gynecology

## 2020-06-09 ENCOUNTER — Inpatient Hospital Stay (HOSPITAL_COMMUNITY)
Admission: AD | Admit: 2020-06-09 | Discharge: 2020-06-09 | Disposition: A | Discharge status: Home / Self Care | Payer: Medicaid Other | Attending: Obstetrics and Gynecology | Admitting: Obstetrics and Gynecology

## 2020-06-09 DIAGNOSIS — Z7982 Long term (current) use of aspirin: Secondary | ICD-10-CM | POA: Insufficient documentation

## 2020-06-09 DIAGNOSIS — O98812 Other maternal infectious and parasitic diseases complicating pregnancy, second trimester: Secondary | ICD-10-CM | POA: Diagnosis present

## 2020-06-09 DIAGNOSIS — O10912 Unspecified pre-existing hypertension complicating pregnancy, second trimester: Secondary | ICD-10-CM | POA: Diagnosis not present

## 2020-06-09 DIAGNOSIS — Z3A25 25 weeks gestation of pregnancy: Secondary | ICD-10-CM

## 2020-06-09 DIAGNOSIS — O4692 Antepartum hemorrhage, unspecified, second trimester: Secondary | ICD-10-CM | POA: Diagnosis not present

## 2020-06-09 DIAGNOSIS — B373 Candidiasis of vulva and vagina: Secondary | ICD-10-CM | POA: Insufficient documentation

## 2020-06-09 DIAGNOSIS — Z881 Allergy status to other antibiotic agents status: Secondary | ICD-10-CM | POA: Insufficient documentation

## 2020-06-09 DIAGNOSIS — J45909 Unspecified asthma, uncomplicated: Secondary | ICD-10-CM | POA: Diagnosis not present

## 2020-06-09 DIAGNOSIS — O99512 Diseases of the respiratory system complicating pregnancy, second trimester: Secondary | ICD-10-CM | POA: Diagnosis not present

## 2020-06-09 DIAGNOSIS — Z79899 Other long term (current) drug therapy: Secondary | ICD-10-CM | POA: Insufficient documentation

## 2020-06-09 DIAGNOSIS — B3731 Acute candidiasis of vulva and vagina: Secondary | ICD-10-CM

## 2020-06-09 LAB — URINALYSIS, ROUTINE W REFLEX MICROSCOPIC
Bilirubin Urine: NEGATIVE
Glucose, UA: NEGATIVE mg/dL
Hgb urine dipstick: NEGATIVE
Ketones, ur: NEGATIVE mg/dL
Leukocytes,Ua: NEGATIVE
Nitrite: NEGATIVE
Protein, ur: NEGATIVE mg/dL
Specific Gravity, Urine: 1.001 — ABNORMAL LOW (ref 1.005–1.030)
pH: 7 (ref 5.0–8.0)

## 2020-06-09 LAB — WET PREP, GENITAL
Clue Cells Wet Prep HPF POC: NONE SEEN
Sperm: NONE SEEN
Trich, Wet Prep: NONE SEEN
Yeast Wet Prep HPF POC: NONE SEEN

## 2020-06-09 MED ORDER — TERCONAZOLE 0.4 % VA CREA: 1.0000 | TOPICAL_CREAM | Freq: Every day | VAGINAL | 0 refills | Status: DC

## 2020-06-09 NOTE — Discharge Instructions (Signed)
Vaginal Bleeding During Pregnancy, Second Trimester ° °A small amount of bleeding (spotting) from the vagina is relatively common during pregnancy. It usually stops on its own. Various things can cause spotting during pregnancy. Sometimes the bleeding is normal and is not a sign of a problem in the pregnancy. However, bleeding can also be a sign of something serious. Be sure to tell your health care provider about any vaginal bleeding right away. °Some possible causes of vaginal bleeding during the second trimester include: °· Infection, inflammation, or growths (polyps) on the cervix. °· A condition in which the placenta partially or completely covers the opening of the cervix inside the uterus (placenta previa). °· The placenta separating from the uterus (placenta abruption). °· Early (preterm) labor. °· The cervix opening and thinning before pregnancy is at term and before labor starts (cervical insufficiency). °· A mass of tissue developing in the uterus due to an egg being fertilized incorrectly (molar pregnancy). °Follow these instructions at home: °Activity °· Follow instructions from your health care provider about limiting your activity. Ask what activities are safe for you. °· If needed, make plans for someone to help with your regular activities. °· Do not exercise or do activities that take a lot of effort unless your health care provider approves. °· Do not lift anything that is heavier than 10 lb (4.5 kg), or the limit that your health care provider tells you, until he or she says that it is safe. °· Do not have sex or orgasms until your health care provider says that this is safe. °Medicines °· Take over-the-counter and prescription medicines only as told by your health care provider. °· Do not take aspirin because it can cause bleeding. °General instructions °· Pay attention to any changes in your symptoms. °· Write down how many pads you use each day, how often you change pads, and how soaked  (saturated) they are. °· Do not use tampons or douche. °· If you pass any tissue from your vagina, save the tissue so you can show it to your health care provider. °· Keep all follow-up visits as told by your health care provider. This is important. °Contact a health care provider if: °· You have vaginal bleeding during any time of your pregnancy. °· You have cramps or labor pains. °· You have a fever that does not get better when you take medicines. °Get help right away if: °· You have severe cramps in your back or abdomen. °· You have contractions. °· You have chills. °· You pass large clots or a large amount of tissue from your vagina. °· Your bleeding increases. °· You feel light-headed or weak, or you faint. °· You are leaking fluid or have a gush of fluid from your vagina. °Summary °· Various things can cause bleeding or spotting in pregnancy. °· Be sure to tell your health care provider about any vaginal bleeding right away. °· Follow instructions from your health care provider about limiting your activity. Ask what activities are safe for you. °This information is not intended to replace advice given to you by your health care provider. Make sure you discuss any questions you have with your health care provider. °Document Revised: 03/07/2019 Document Reviewed: 02/18/2017 °Elsevier Patient Education © 2020 Elsevier Inc. ° °

## 2020-06-09 NOTE — MAU Provider Note (Signed)
Chief Complaint:  Vaginal Bleeding   First Provider Initiated Contact with Patient 06/09/20 1202     HPI: Jackie Newman is a 29 y.o. G2P0010 at [redacted]w[redacted]d who presents to maternity admissions reporting vaginal bleeding.  Reports intermittent pink spotting for the last 3 days.  Has also had some lower abdominal cramping and a few episodes of diarrhea since yesterday.  Denies dysuria, nausea/vomiting, loss of fluid, or recent intercourse.  Good fetal movement.  Location: abdomen Quality: cramping Severity: 6/10 in pain scale Duration: 2 days Timing: intermittent Modifying factors: none Associated signs and symptoms: diarrhea, vaginal bleeding  Pregnancy Course: [facility]. CHTN on labetalol.   Past Medical History:  Diagnosis Date  . Asthma   . Congenital umbilical hernia   . Hypertension   . Ovarian cyst   . Seizures (HCC)    No follow up. Patient stated she had one seizure during her 23st birthday drinking alcohol.   OB History  Gravida Para Term Preterm AB Living  2       1    SAB TAB Ectopic Multiple Live Births  1            # Outcome Date GA Lbr Len/2nd Weight Sex Delivery Anes PTL Lv  2 Current           1 SAB 2015           Past Surgical History:  Procedure Laterality Date  . COLONOSCOPY WITH ESOPHAGOGASTRODUODENOSCOPY (EGD)  12/04/2019   Family History  Problem Relation Age of Onset  . Asthma Father   . Gout Father    Social History   Tobacco Use  . Smoking status: Passive Smoke Exposure - Never Smoker  . Smokeless tobacco: Never Used  Vaping Use  . Vaping Use: Never used  Substance Use Topics  . Alcohol use: No    Comment: occasional socially  . Drug use: Not Currently    Types: Marijuana    Comment: quit 3-4 weeks ago   Allergies  Allergen Reactions  . Doxycycline Nausea And Vomiting   No medications prior to admission.    I have reviewed patient's Past Medical Hx, Surgical Hx, Family Hx, Social Hx, medications and allergies.   ROS:  Review  of Systems  Constitutional: Negative.   Gastrointestinal: Positive for abdominal pain and diarrhea. Negative for constipation, nausea and vomiting.  Genitourinary: Positive for vaginal bleeding. Negative for dysuria and vaginal discharge.    Physical Exam   Patient Vitals for the past 24 hrs:  BP Temp Temp src Pulse Resp SpO2 Height Weight  06/09/20 1301 111/61 -- -- 72 -- -- -- --  06/09/20 1122 120/76 98.9 F (37.2 C) Oral 86 16 99 % 5\' 5"  (1.651 m) 87.8 kg    Constitutional: Well-developed, well-nourished female in no acute distress.  Cardiovascular: normal rate & rhythm, no murmur Respiratory: normal effort, lung sounds clear throughout GI: Abd soft, non-tender, gravid appropriate for gestational age. Pos BS x 4 MS: Extremities nontender, no edema, normal ROM Neurologic: Alert and oriented x 4.  GU:      Pelvic: NEFG, no blood. Thick white discharge adherent to cervix & vaginal walls.    Dilation: Closed Effacement (%): Thick Exam by:: [email or web address] NP  Fetal Tracing:  Baseline: 145 Variability: moderate Accelerations: 10x10 Decelerations: none  Toco: uterine irritability    Labs: Results for orders placed or performed during the hospital encounter of 06/09/20 (from the past 24 hour(s))  Urinalysis, Routine w reflex microscopic  Status: Abnormal   Collection Time: 06/09/20 11:25 AM  Result Value Ref Range   Color, Urine COLORLESS (A) YELLOW   APPearance CLEAR CLEAR   Specific Gravity, Urine 1.001 (L) 1.005 - 1.030   pH 7.0 5.0 - 8.0   Glucose, UA NEGATIVE NEGATIVE mg/dL   Hgb urine dipstick NEGATIVE NEGATIVE   Bilirubin Urine NEGATIVE NEGATIVE   Ketones, ur NEGATIVE NEGATIVE mg/dL   Protein, ur NEGATIVE NEGATIVE mg/dL   Nitrite NEGATIVE NEGATIVE   Leukocytes,Ua NEGATIVE NEGATIVE  Wet prep, genital     Status: Abnormal   Collection Time: 06/09/20 12:16 PM   Specimen: PATH Cytology Cervicovaginal Ancillary Only  Result Value Ref Range   Yeast Wet Prep HPF  POC NONE SEEN NONE SEEN   Trich, Wet Prep NONE SEEN NONE SEEN   Clue Cells Wet Prep HPF POC NONE SEEN NONE SEEN   WBC, Wet Prep HPF POC MANY (A) NONE SEEN   Sperm NONE SEEN     Imaging:  No results found.  MAU Course: Orders Placed This Encounter  Procedures  . Wet prep, genital  . Urinalysis, Routine w reflex microscopic  . Discharge patient   Meds ordered this encounter  Medications  . terconazole (TERAZOL 7) 0.4 % vaginal cream    Sig: Place 1 applicator vaginally at bedtime.    Dispense:  45 g    Refill:  0    Order Specific Question:   Supervising Provider    Answer:   Cheatham Bing [[id number]]    MDM: UI on toco. Cervix closed/thick. No contractions palpated.  Spec exam performed; no blood. Discharge consistent with yeast & patient reports vaginal itching. Wet prep negative but will tx for yeast based on exam & pt's symptoms.  RH positive  Assessment: 1. Vaginal yeast infection   2. [redacted] weeks gestation of pregnancy   3. Vaginal bleeding in pregnancy, second trimester     Plan: Discharge home in stable condition.  Rx terazol GC/CT pending PTL precautions    Allergies as of 06/09/2020      Reactions   Doxycycline Nausea And Vomiting      Medication List    TAKE these medications   albuterol 108 (90 Base) MCG/ACT inhaler Commonly known as: VENTOLIN HFA Inhale 1-2 puffs into the lungs every 6 (six) hours as needed for wheezing or shortness of breath.   aspirin 81 MG chewable tablet Chew 1 tablet (81 mg total) by mouth daily.   Blood Pressure Monitor Automat Devi 1 Device by Does not apply route daily. Automatic blood pressure cuff regular size. To monitor blood pressure regularly at home. ICD-10 code: O09.90   butalbital-acetaminophen-caffeine 50-325-40 MG tablet Commonly known as: FIORICET Take 1-2 tablets by mouth every 6 (six) hours as needed for headache.   Gojji Weight Scale Misc 1 Device by Does not apply route daily as needed. To weight self  daily as needed at home. ICD-10 code: O09.90   labetalol 200 MG tablet Commonly known as: NORMODYNE Take 1 tablet (200 mg total) by mouth 2 (two) times daily.   terconazole 0.4 % vaginal cream Commonly known as: TERAZOL 7 Place 1 applicator vaginally at bedtime.   Wrist Splint/Cock-Up/Left L Misc 1 Device by Does not apply route 3 times/day as needed-between meals & bedtime.   Wrist Splint/Cock-Up/Right L Misc 1 Device by Does not apply route as needed.       Judeth Horn, NP 06/09/2020 4:41 PM

## 2020-06-09 NOTE — MAU Note (Signed)
Jackie Newman is a 29 y.o. at [redacted]w[redacted]d here in MAU reporting: intermittent spotting for the past 3 days. Only sees bleeding when she wipes. Also reporting lower abdominal pain for 3 days. Been having diarrhea for 2 days. No LOF. +FM  Onset of complaint: ongoing  Pain score: 6/10  Vitals:   06/09/20 1122  BP: 120/76  Pulse: 86  Resp: 16  Temp: 98.9 F (37.2 C)  SpO2: 99%     FHT: +FM EFM applied  Lab orders placed from triage: UA

## 2020-06-10 ENCOUNTER — Other Ambulatory Visit (INDEPENDENT_AMBULATORY_CARE_PROVIDER_SITE_OTHER): Payer: Medicaid Other | Admitting: *Deleted

## 2020-06-10 DIAGNOSIS — Z3480 Encounter for supervision of other normal pregnancy, unspecified trimester: Secondary | ICD-10-CM

## 2020-06-10 LAB — GC/CHLAMYDIA PROBE AMP (~~LOC~~) NOT AT ARMC
Chlamydia: NEGATIVE
Comment: NEGATIVE
Comment: NORMAL
Neisseria Gonorrhea: NEGATIVE

## 2020-06-10 NOTE — Progress Notes (Signed)
   Patient in clinic for 28 wk labs.  Laurent Cargile L, RN  

## 2020-06-11 ENCOUNTER — Telehealth: Payer: Self-pay | Admitting: *Deleted

## 2020-06-11 LAB — GLUCOSE TOLERANCE, 2 HOURS W/ 1HR
Glucose, 1 hour: 171 mg/dL (ref 65–179)
Glucose, 2 hour: 117 mg/dL (ref 65–152)
Glucose, Fasting: 83 mg/dL (ref 65–91)

## 2020-06-11 LAB — CBC
Hematocrit: 36.2 % (ref 34.0–46.6)
Hemoglobin: 12 g/dL (ref 11.1–15.9)
MCH: 30.9 pg (ref 26.6–33.0)
MCHC: 33.1 g/dL (ref 31.5–35.7)
MCV: 93 fL (ref 79–97)
Platelets: 284 10*3/uL (ref 150–450)
RBC: 3.88 x10E6/uL (ref 3.77–5.28)
RDW: 13.1 % (ref 11.7–15.4)
WBC: 10.3 10*3/uL (ref 3.4–10.8)

## 2020-06-11 LAB — HIV ANTIBODY (ROUTINE TESTING W REFLEX): HIV Screen 4th Generation wRfx: NONREACTIVE

## 2020-06-11 LAB — RPR: RPR Ser Ql: NONREACTIVE

## 2020-06-11 NOTE — Telephone Encounter (Signed)
Patient called requesting lab results from 06/10/20. Labs results were within normal limits.  Clovis Pu, RN

## 2020-06-17 ENCOUNTER — Other Ambulatory Visit: Payer: Self-pay

## 2020-06-17 ENCOUNTER — Ambulatory Visit: Payer: Medicaid Other | Attending: Women's Health

## 2020-06-17 ENCOUNTER — Inpatient Hospital Stay (HOSPITAL_COMMUNITY)
Admission: AD | Admit: 2020-06-17 | Discharge: 2020-06-18 | Disposition: A | Discharge status: Home / Self Care | Payer: Medicaid Other | Attending: Obstetrics & Gynecology | Admitting: Obstetrics & Gynecology

## 2020-06-17 ENCOUNTER — Ambulatory Visit: Payer: Medicaid Other | Admitting: *Deleted

## 2020-06-17 ENCOUNTER — Encounter (HOSPITAL_COMMUNITY): Payer: Self-pay | Admitting: Obstetrics & Gynecology

## 2020-06-17 ENCOUNTER — Encounter: Payer: Self-pay | Admitting: *Deleted

## 2020-06-17 DIAGNOSIS — O10012 Pre-existing essential hypertension complicating pregnancy, second trimester: Secondary | ICD-10-CM

## 2020-06-17 DIAGNOSIS — O99512 Diseases of the respiratory system complicating pregnancy, second trimester: Secondary | ICD-10-CM | POA: Diagnosis not present

## 2020-06-17 DIAGNOSIS — O402XX0 Polyhydramnios, second trimester, not applicable or unspecified: Secondary | ICD-10-CM

## 2020-06-17 DIAGNOSIS — O210 Mild hyperemesis gravidarum: Secondary | ICD-10-CM | POA: Diagnosis present

## 2020-06-17 DIAGNOSIS — A5901 Trichomonal vulvovaginitis: Secondary | ICD-10-CM | POA: Diagnosis present

## 2020-06-17 DIAGNOSIS — O99612 Diseases of the digestive system complicating pregnancy, second trimester: Secondary | ICD-10-CM | POA: Diagnosis present

## 2020-06-17 DIAGNOSIS — Z3480 Encounter for supervision of other normal pregnancy, unspecified trimester: Secondary | ICD-10-CM | POA: Diagnosis present

## 2020-06-17 DIAGNOSIS — Z7982 Long term (current) use of aspirin: Secondary | ICD-10-CM | POA: Insufficient documentation

## 2020-06-17 DIAGNOSIS — Z881 Allergy status to other antibiotic agents status: Secondary | ICD-10-CM | POA: Diagnosis not present

## 2020-06-17 DIAGNOSIS — Z3A27 27 weeks gestation of pregnancy: Secondary | ICD-10-CM | POA: Insufficient documentation

## 2020-06-17 DIAGNOSIS — O10912 Unspecified pre-existing hypertension complicating pregnancy, second trimester: Secondary | ICD-10-CM | POA: Insufficient documentation

## 2020-06-17 DIAGNOSIS — J45909 Unspecified asthma, uncomplicated: Secondary | ICD-10-CM | POA: Insufficient documentation

## 2020-06-17 DIAGNOSIS — Z362 Encounter for other antenatal screening follow-up: Secondary | ICD-10-CM

## 2020-06-17 DIAGNOSIS — O10919 Unspecified pre-existing hypertension complicating pregnancy, unspecified trimester: Secondary | ICD-10-CM | POA: Insufficient documentation

## 2020-06-17 DIAGNOSIS — Z79899 Other long term (current) drug therapy: Secondary | ICD-10-CM | POA: Diagnosis not present

## 2020-06-17 DIAGNOSIS — A084 Viral intestinal infection, unspecified: Secondary | ICD-10-CM | POA: Diagnosis not present

## 2020-06-17 DIAGNOSIS — O98812 Other maternal infectious and parasitic diseases complicating pregnancy, second trimester: Secondary | ICD-10-CM | POA: Diagnosis not present

## 2020-06-17 DIAGNOSIS — A09 Infectious gastroenteritis and colitis, unspecified: Secondary | ICD-10-CM | POA: Diagnosis not present

## 2020-06-17 LAB — URINALYSIS, ROUTINE W REFLEX MICROSCOPIC
Bilirubin Urine: NEGATIVE
Glucose, UA: NEGATIVE mg/dL
Hgb urine dipstick: NEGATIVE
Ketones, ur: 80 mg/dL — AB
Leukocytes,Ua: NEGATIVE
Nitrite: NEGATIVE
Protein, ur: NEGATIVE mg/dL
Specific Gravity, Urine: 1.011 (ref 1.005–1.030)
pH: 8 (ref 5.0–8.0)

## 2020-06-17 LAB — CBC
HCT: 38.4 % (ref 36.0–46.0)
Hemoglobin: 12.7 g/dL (ref 12.0–15.0)
MCH: 30.5 pg (ref 26.0–34.0)
MCHC: 33.1 g/dL (ref 30.0–36.0)
MCV: 92.1 fL (ref 80.0–100.0)
Platelets: 280 10*3/uL (ref 150–400)
RBC: 4.17 MIL/uL (ref 3.87–5.11)
RDW: 13.3 % (ref 11.5–15.5)
WBC: 15.1 10*3/uL — ABNORMAL HIGH (ref 4.0–10.5)
nRBC: 0 % (ref 0.0–0.2)

## 2020-06-17 LAB — BASIC METABOLIC PANEL
Anion gap: 12 (ref 5–15)
BUN: <5 mg/dL — ABNORMAL LOW (ref 6–20)
CO2: 19 mmol/L — ABNORMAL LOW (ref 22–32)
Calcium: 9.3 mg/dL (ref 8.9–10.3)
Chloride: 107 mmol/L (ref 98–111)
Creatinine, Ser: 0.75 mg/dL (ref 0.44–1.00)
GFR calc Af Amer: >60 mL/min (ref >60)
GFR calc non Af Amer: >60 mL/min (ref >60)
Glucose, Bld: 93 mg/dL (ref 70–99)
Potassium: 3.6 mmol/L (ref 3.5–5.1)
Sodium: 138 mmol/L (ref 135–145)

## 2020-06-17 MED ORDER — LACTATED RINGERS IV BOLUS
1000.0000 mL | Freq: Once | INTRAVENOUS | Status: AC
  Administered 2020-06-17: 1000 mL via INTRAVENOUS

## 2020-06-17 MED ORDER — FAMOTIDINE IN NACL 20-0.9 MG/50ML-% IV SOLN
20.0000 mg | Freq: Once | INTRAVENOUS | Status: AC
  Administered 2020-06-17: 20 mg via INTRAVENOUS
  Filled 2020-06-17: qty 50

## 2020-06-17 MED ORDER — PROMETHAZINE HCL 25 MG/ML IJ SOLN
25.0000 mg | Freq: Four times a day (QID) | INTRAMUSCULAR | Status: DC | PRN
  Administered 2020-06-17: 25 mg via INTRAVENOUS
  Filled 2020-06-17: qty 1

## 2020-06-17 MED ORDER — SODIUM CHLORIDE 0.9 % IV SOLN
8.0000 mg | Freq: Once | INTRAVENOUS | Status: AC
  Administered 2020-06-17: 8 mg via INTRAVENOUS
  Filled 2020-06-17: qty 4

## 2020-06-17 NOTE — MAU Note (Addendum)
Patient reports having hot flashes, nausea, vomiting and diarrhea since her U/S today.  Vomited X 4-5 since leaving.  Denies VB/LOF.  + FM.  States she feels like baby is balling up on her right side and causing severe pain that is constant.

## 2020-06-17 NOTE — MAU Provider Note (Signed)
History     CSN: [id number]  Arrival date and time: 06/17/20 2024   First Provider Initiated Contact with Patient 06/17/20 2111      Chief Complaint  Patient presents with  . Nausea  . Emesis   29 y.o. G2P0010 @27 .0 wks presenting with N/V/D, chills, and hot flashes. Sx started this afternoon while she was having an . Reports several episodes of vomiting and watery diarrhea. She ate a hamburger type meal prior to the symptoms. Denies sick contacts or fever. Denies VB, LOF, or ctx. Reports good FM.    OB History    Gravida  2   Para      Term      Preterm      AB  1   Living        SAB  1   TAB      Ectopic      Multiple      Live Births              Past Medical History:  Diagnosis Date  . Asthma   . Congenital umbilical hernia   . Hypertension   . Ovarian cyst   . Seizures (HCC)    No follow up. Patient stated she had one seizure during her 23st birthday drinking alcohol.    Past Surgical History:  Procedure Laterality Date  . COLONOSCOPY WITH ESOPHAGOGASTRODUODENOSCOPY (EGD)  12/04/2019    Family History  Problem Relation Age of Onset  . Asthma Father   . Gout Father     Social History   Tobacco Use  . Smoking status: Passive Smoke Exposure - Never Smoker  . Smokeless tobacco: Never Used  Vaping Use  . Vaping Use: Never used  Substance Use Topics  . Alcohol use: No    Comment: occasional socially  . Drug use: Not Currently    Types: Marijuana    Comment: quit 3-4 weeks ago    Allergies:  Allergies  Allergen Reactions  . Doxycycline Nausea And Vomiting    Medications Prior to Admission  Medication Sig Dispense Refill Last Dose  . aspirin 81 MG chewable tablet Chew 1 tablet (81 mg total) by mouth daily. 30 tablet 6 06/16/2020 at Unknown time  . Blood Pressure Monitoring (BLOOD PRESSURE MONITOR AUTOMAT) DEVI 1 Device by Does not apply route daily. Automatic blood pressure cuff regular size. To monitor blood pressure regularly  at home. ICD-10 code: O09.90 1 each 0 06/17/2020 at Unknown time  . labetalol (NORMODYNE) 200 MG tablet Take 1 tablet (200 mg total) by mouth 2 (two) times daily. 60 tablet 3 06/17/2020 at Unknown time  . albuterol (VENTOLIN HFA) 108 (90 Base) MCG/ACT inhaler Inhale 1-2 puffs into the lungs every 6 (six) hours as needed for wheezing or shortness of breath. 18 g 3 More than a month at Unknown time  . butalbital-acetaminophen-caffeine (FIORICET) 50-325-40 MG tablet Take 1-2 tablets by mouth every 6 (six) hours as needed for headache. (Patient not taking: Reported on 05/30/2020) 6 tablet 0   . Elastic Bandages & Supports (WRIST SPLINT/COCK-UP/LEFT L) MISC 1 Device by Does not apply route 3 times/day as needed-between meals & bedtime. 1 each 0   . Elastic Bandages & Supports (WRIST SPLINT/COCK-UP/RIGHT L) MISC 1 Device by Does not apply route as needed. 1 each 0   . Misc. Devices (GOJJI WEIGHT SCALE) MISC 1 Device by Does not apply route daily as needed. To weight self daily as needed at home. ICD-10 code: O70.90  1 each 0   . terconazole (TERAZOL 7) 0.4 % vaginal cream Place 1 applicator vaginally at bedtime. 45 g 0     Review of Systems  Constitutional: Positive for chills. Negative for fever.  Gastrointestinal: Positive for diarrhea, nausea and vomiting. Negative for abdominal pain.  Genitourinary: Negative for dysuria, frequency, hematuria, vaginal bleeding and vaginal discharge.  Musculoskeletal: Negative for back pain.   Physical Exam   Blood pressure 118/69, pulse 96, temperature 98.6 F (37 C), resp. rate 19, last menstrual period 12/11/2019.  Physical Exam Vitals and nursing note reviewed. Exam conducted with a chaperone present.  Constitutional:      General: She is not in acute distress. HENT:     Head: Normocephalic and atraumatic.  Pulmonary:     Effort: Pulmonary effort is normal. No respiratory distress.  Abdominal:     General: There is no distension.     Tenderness: There is no  abdominal tenderness.     Comments: gravid  Musculoskeletal:        General: Normal range of motion.  Skin:    General: Skin is warm and dry.  Neurological:     General: No focal deficit present.     Mental Status: She is alert and oriented to person, place, and time.  Psychiatric:        Mood and Affect: Mood normal.   EFM: 150 bpm, mod variability, + accels, rare variable decel Toco: none  Results for orders placed or performed during the hospital encounter of 06/17/20 (from the past 24 hour(s))  CBC     Status: Abnormal   Collection Time: 06/17/20  9:17 PM  Result Value Ref Range   WBC 15.1 (H) 4.0 - 10.5 K/uL   RBC 4.17 3.87 - 5.11 MIL/uL   Hemoglobin 12.7 12.0 - 15.0 g/dL   HCT 46.8 36 - 46 %   MCV 92.1 80.0 - 100.0 fL   MCH 30.5 26.0 - 34.0 pg   MCHC 33.1 30.0 - 36.0 g/dL   RDW 03.2 12.2 - 48.2 %   Platelets 280 150 - 400 K/uL   nRBC 0.0 0.0 - 0.2 %  Basic metabolic panel     Status: Abnormal   Collection Time: 06/17/20  9:17 PM  Result Value Ref Range   Sodium 138 135 - 145 mmol/L   Potassium 3.6 3.5 - 5.1 mmol/L   Chloride 107 98 - 111 mmol/L   CO2 19 (L) 22 - 32 mmol/L   Glucose, Bld 93 70 - 99 mg/dL   BUN <5 (L) 6 - 20 mg/dL   Creatinine, Ser 5.00 0.44 - 1.00 mg/dL   Calcium 9.3 8.9 - 37.0 mg/dL   GFR calc non Af Amer >60 >60 mL/min   GFR calc Af Amer >60 >60 mL/min   Anion gap 12 5 - 15  Urinalysis, Routine w reflex microscopic     Status: Abnormal   Collection Time: 06/17/20  9:20 PM  Result Value Ref Range   Color, Urine YELLOW YELLOW   APPearance HAZY (A) CLEAR   Specific Gravity, Urine 1.011 1.005 - 1.030   pH 8.0 5.0 - 8.0   Glucose, UA NEGATIVE NEGATIVE mg/dL   Hgb urine dipstick NEGATIVE NEGATIVE   Bilirubin Urine NEGATIVE NEGATIVE   Ketones, ur 80 (A) NEGATIVE mg/dL   Protein, ur NEGATIVE NEGATIVE mg/dL   Nitrite NEGATIVE NEGATIVE   Leukocytes,Ua NEGATIVE NEGATIVE    MAU Course  Procedures Meds ordered this encounter  Medications  .  lactated ringers bolus 1,000 mL  . famotidine (PEPCID) IVPB 20 mg premix  . ondansetron (ZOFRAN) 8 mg in sodium chloride 0.9 % 50 mL IVPB  . promethazine (PHENERGAN) injection 25 mg  . lactated ringers bolus 1,000 mL  . promethazine (PHENERGAN) 25 MG tablet    Sig: Take 0.5-1 tablets (12.5-25 mg total) by mouth every 6 (six) hours as needed for nausea or vomiting.    Dispense:  30 tablet    Refill:  0    Order Specific Question:   Supervising Provider    Answer:   Lazaro Arms [[id number]]   MDM Labs ordered and reviewed. Feels better after Phenergan, no emesis. Discussed dx and treatment, usually self-limiting, supportive therapies, stressed importance of handwashing. Stable for discharge home.   Assessment and Plan   1. [redacted] weeks gestation of pregnancy   2. Viral gastroenteritis    Discharge home Follow up at [facility] as scheduled Maintain hydration Rx Phenergan Imodium OTC prn Return precautions   Allergies as of 06/18/2020      Reactions   Doxycycline Nausea And Vomiting      Medication List    TAKE these medications   albuterol 108 (90 Base) MCG/ACT inhaler Commonly known as: VENTOLIN HFA Inhale 1-2 puffs into the lungs every 6 (six) hours as needed for wheezing or shortness of breath.   aspirin 81 MG chewable tablet Chew 1 tablet (81 mg total) by mouth daily.   Blood Pressure Monitor Automat Devi 1 Device by Does not apply route daily. Automatic blood pressure cuff regular size. To monitor blood pressure regularly at home. ICD-10 code: O09.90   butalbital-acetaminophen-caffeine 50-325-40 MG tablet Commonly known as: FIORICET Take 1-2 tablets by mouth every 6 (six) hours as needed for headache.   Gojji Weight Scale Misc 1 Device by Does not apply route daily as needed. To weight self daily as needed at home. ICD-10 code: O09.90   labetalol 200 MG tablet Commonly known as: NORMODYNE Take 1 tablet (200 mg total) by mouth 2 (two) times daily.   promethazine 25 MG  tablet Commonly known as: PHENERGAN Take 0.5-1 tablets (12.5-25 mg total) by mouth every 6 (six) hours as needed for nausea or vomiting.   terconazole 0.4 % vaginal cream Commonly known as: TERAZOL 7 Place 1 applicator vaginally at bedtime.   Wrist Splint/Cock-Up/Left L Misc 1 Device by Does not apply route 3 times/day as needed-between meals & bedtime.   Wrist Splint/Cock-Up/Right L Misc 1 Device by Does not apply route as needed.      Donette Larry, CNM 06/18/2020, 12:30 AM

## 2020-06-18 ENCOUNTER — Other Ambulatory Visit: Payer: Self-pay | Admitting: *Deleted

## 2020-06-18 DIAGNOSIS — Z3A27 27 weeks gestation of pregnancy: Secondary | ICD-10-CM

## 2020-06-18 DIAGNOSIS — O98812 Other maternal infectious and parasitic diseases complicating pregnancy, second trimester: Secondary | ICD-10-CM

## 2020-06-18 DIAGNOSIS — O403XX0 Polyhydramnios, third trimester, not applicable or unspecified: Secondary | ICD-10-CM

## 2020-06-18 DIAGNOSIS — A09 Infectious gastroenteritis and colitis, unspecified: Secondary | ICD-10-CM

## 2020-06-18 MED ORDER — PROMETHAZINE HCL 25 MG PO TABS: 12.5000 mg | ORAL_TABLET | Freq: Four times a day (QID) | ORAL | 0 refills | Status: DC | PRN

## 2020-06-18 NOTE — Discharge Instructions (Signed)
Viral Gastroenteritis, Adult  Viral gastroenteritis is also known as the stomach flu. This condition may affect your stomach, your small intestine, and your large intestine. It can cause sudden watery poop (diarrhea), fever, and throwing up (vomiting). This condition is caused by certain germs (viruses). These germs can be passed from person to person very easily (are contagious). Having watery poop and throwing up can make you feel weak and cause you to not have enough water in your body (get dehydrated). This can make you tired and thirsty, make you have a dry mouth, and make it so you pee (urinate) less often. It is important to replace the fluids that you lose from having watery poop and throwing up. What are the causes?  You can get sick by catching viruses from other people.  You can also get sick by: ? Eating food, drinking water, or touching a surface that has the viruses on it (is contaminated). ? Sharing utensils or other personal items with a person who is sick. What increases the risk?  Having a weak body defense system (immune system).  Living with one or more children who are younger than 2 years old.  Living in a nursing home.  Going on cruise ships. What are the signs or symptoms? Symptoms of this condition start suddenly. Symptoms may last for a few days or for as long as a week.  Common symptoms include: ? Watery poop. ? Throwing up.  Other symptoms include: ? Fever. ? Headache. ? Feeling tired (fatigue). ? Pain in the belly (abdomen). ? Chills. ? Feeling weak. ? Feeling sick to your stomach (nauseous). ? Muscle aches. ? Not feeling hungry. How is this treated?  This condition typically goes away on its own.  The focus of treatment is to replace the fluids that you lose. This condition may be treated with: ? An ORS (oral rehydration solution). This is a drink that is sold at pharmacies and stores. ? Medicines to help with your symptoms. ? Probiotic  supplements to reduce symptoms of diarrhea. ? Fluids given through an IV tube, if needed.  Older adults and people with other diseases or a weak body defense system are at higher risk for not having enough water in the body. Follow these instructions at home: Eating and drinking   Take an ORS as told by your doctor.  Drink clear fluids in small amounts as you are able. Clear fluids include: ? Water. ? Ice chips. ? Fruit juice with water added to it (diluted). ? Low-calorie sports drinks.  Drink enough fluid to keep your pee (urine) pale yellow.  Eat small amounts of healthy foods every 3-4 hours as you are able. This may include whole grains, fruits, vegetables, lean meats, and yogurt.  Avoid fluids that have a lot of sugar or caffeine in them, such as energy drinks, sports drinks, and soda.  Avoid spicy or fatty foods.  Avoid alcohol. General instructions   Wash your hands often. This is very important after you have watery poop or you throw up. If you cannot use soap and water, use hand sanitizer.  Make sure that all people in your home wash their hands well and often.  Take over-the-counter and prescription medicines only as told by your doctor.  Rest at home while you get better.  Watch your condition for any changes.  Take a warm bath to help with any burning or pain from having watery poop.  Keep all follow-up visits as told by your doctor.   This is important. Contact a doctor if:  You cannot keep fluids down.  Your symptoms get worse.  You have new symptoms.  You feel light-headed.  You feel dizzy.  You have muscle cramps. Get help right away if:  You have chest pain.  You feel very weak.  You pass out (faint).  You see blood in your throw-up.  Your throw-up looks like coffee grounds.  You have bloody or black poop (stools) or poop that looks like tar.  You have a very bad headache, or a stiff neck, or both.  You have a rash.  You have  very bad pain, cramping, or bloating in your belly.  You have trouble breathing.  You are breathing very quickly.  You have a fast heartbeat.  Your skin feels cold and clammy.  You feel mixed up (confused).  You have pain when you pee.  You have signs of not having enough water in the body, such as: ? Dark pee, hardly any pee, or no pee. ? Cracked lips. ? Dry mouth. ? Sunken eyes. ? Feeling very sleepy. ? Feeling weak. Summary  Viral gastroenteritis is also known as the stomach flu.  This condition can cause sudden watery poop (diarrhea), fever, and throwing up (vomiting).  These germs can be passed from person to person very easily.  Take an ORS as told by your doctor. This is a drink that is sold at pharmacies and stores.  Drink fluids in small amounts many times each day as you are able. This information is not intended to replace advice given to you by your health care provider. Make sure you discuss any questions you have with your health care provider. Document Revised: 09/21/2018 Document Reviewed: 09/21/2018 Elsevier Patient Education  2020 [employer].   Food Choices to Help Relieve Diarrhea, Adult When you have diarrhea, the foods you eat and your eating habits are very important. Choosing the right foods and drinks can help:  Relieve diarrhea.  Replace lost fluids and nutrients.  Prevent dehydration. What general guidelines should I follow?  Relieving diarrhea  Choose foods with less than 2 g or .07 oz. of fiber per serving.  Limit fats to less than 8 tsp (38 g or 1.34 oz.) a day.  Avoid the following: ? Foods and beverages sweetened with high-fructose corn syrup, honey, or sugar alcohols such as xylitol, sorbitol, and mannitol. ? Foods that contain a lot of fat or sugar. ? Fried, greasy, or spicy foods. ? High-fiber grains, breads, and cereals. ? Raw fruits and vegetables.  Eat foods that are rich in probiotics. These foods include dairy  products such as yogurt and fermented milk products. They help increase healthy bacteria in the stomach and intestines (gastrointestinal tract, or GI tract).  If you have lactose intolerance, avoid dairy products. These may make your diarrhea worse.  Take medicine to help stop diarrhea (antidiarrheal medicine) only as told by your health care provider. Replacing nutrients  Eat small meals or snacks every 3-4 hours.  Eat bland foods, such as white rice, toast, or baked potato, until your diarrhea starts to get better. Gradually reintroduce nutrient-rich foods as tolerated or as told by your health care provider. This includes: ? Well-cooked protein foods. ? Peeled, seeded, and soft-cooked fruits and vegetables. ? Low-fat dairy products.  Take vitamin and mineral supplements as told by your health care provider. Preventing dehydration  Start by sipping water or a special solution to prevent dehydration (oral rehydration solution, ORS). Urine that  is clear or pale yellow means that you are getting enough fluid.  Try to drink at least 8-10 cups of fluid each day to help replace lost fluids.  You may add other liquids in addition to water, such as clear juice or decaffeinated sports drinks, as tolerated or as told by your health care provider.  Avoid drinks with caffeine, such as coffee, tea, or soft drinks.  Avoid alcohol. What foods are recommended?     The items listed may not be a complete list. Talk with your health care provider about what dietary choices are best for you. Grains White rice. White, [place], or pita breads (fresh or toasted), including plain rolls, buns, or bagels. White pasta. Saltine, soda, or graham crackers. Pretzels. Low-fiber cereal. Cooked cereals made with water (such as cornmeal, farina, or cream cereals). Plain muffins. Matzo. Melba toast. Zwieback. Vegetables Potatoes (without the skin). Most well-cooked and canned vegetables without skins or seeds. Tender  lettuce. Fruits Apple sauce. Fruits canned in juice. Cooked apricots, cherries, grapefruit, peaches, pears, or plums. Fresh bananas and cantaloupe. Meats and other protein foods Baked or boiled chicken. Eggs. Tofu. Fish. Seafood. Smooth nut butters. Ground or well-cooked tender beef, ham, veal, lamb, pork, or poultry. Dairy Plain yogurt, kefir, and unsweetened liquid yogurt. Lactose-free milk, buttermilk, skim milk, or soy milk. Low-fat or nonfat hard cheese. Beverages Water. Low-calorie sports drinks. Fruit juices without pulp. Strained tomato and vegetable juices. Decaffeinated teas. Sugar-free beverages not sweetened with sugar alcohols. Oral rehydration solutions, if approved by your health care provider. Seasoning and other foods Bouillon, broth, or soups made from recommended foods. What foods are not recommended? The items listed may not be a complete list. Talk with your health care provider about what dietary choices are best for you. Grains Whole grain, whole wheat, bran, or rye breads, rolls, pastas, and crackers. Wild or brown rice. Whole grain or bran cereals. Barley. Oats and oatmeal. Corn tortillas or taco shells. Granola. Popcorn. Vegetables Raw vegetables. Fried vegetables. Cabbage, broccoli, Brussels sprouts, artichokes, baked beans, beet greens, corn, kale, legumes, peas, sweet potatoes, and yams. Potato skins. Cooked spinach and cabbage. Fruits Dried fruit, including raisins and dates. Raw fruits. Stewed or dried prunes. Canned fruits with syrup. Meat and other protein foods Fried or fatty meats. Deli meats. Chunky nut butters. Nuts and seeds. Beans and lentils. Tomasa Blase. Hot dogs. Sausage. Dairy High-fat cheeses. Whole milk, chocolate milk, and beverages made with milk, such as milk shakes. Half-and-half. Cream. sour cream. Ice cream. Beverages Caffeinated beverages (such as coffee, tea, soda, or energy drinks). Alcoholic beverages. Fruit juices with pulp. Prune juice. Soft  drinks sweetened with high-fructose corn syrup or sugar alcohols. High-calorie sports drinks. Fats and oils Butter. Cream sauces. Margarine. Salad oils. Plain salad dressings. Olives. Avocados. Mayonnaise. Sweets and desserts Sweet rolls, doughnuts, and sweet breads. Sugar-free desserts sweetened with sugar alcohols such as xylitol and sorbitol. Seasoning and other foods Honey. Hot sauce. Chili powder. Gravy. Cream-based or milk-based soups. Pancakes and waffles. Summary  When you have diarrhea, the foods you eat and your eating habits are very important.  Make sure you get at least 8-10 cups of fluid each day, or enough to keep your urine clear or pale yellow.  Eat bland foods and gradually reintroduce healthy, nutrient-rich foods as tolerated, or as told by your health care provider.  Avoid high-fiber, fried, greasy, or spicy foods. This information is not intended to replace advice given to you by your health care provider. Make sure  you discuss any questions you have with your health care provider. Document Revised: 03/09/2019 Document Reviewed: 11/13/2016 Elsevier Patient Education  2020 Elsevier Inc.  

## 2020-06-27 ENCOUNTER — Telehealth (INDEPENDENT_AMBULATORY_CARE_PROVIDER_SITE_OTHER): Payer: Medicaid Other | Admitting: Obstetrics and Gynecology

## 2020-06-27 ENCOUNTER — Encounter: Payer: Self-pay | Admitting: Obstetrics and Gynecology

## 2020-06-27 VITALS — BP 120/76 | HR 76 | Wt 196.8 lb

## 2020-06-27 DIAGNOSIS — O10919 Unspecified pre-existing hypertension complicating pregnancy, unspecified trimester: Secondary | ICD-10-CM | POA: Diagnosis not present

## 2020-06-27 DIAGNOSIS — Z3480 Encounter for supervision of other normal pregnancy, unspecified trimester: Secondary | ICD-10-CM | POA: Diagnosis not present

## 2020-06-27 DIAGNOSIS — O0993 Supervision of high risk pregnancy, unspecified, third trimester: Secondary | ICD-10-CM

## 2020-06-27 NOTE — Progress Notes (Signed)
MY CHART VIDEO VIRTUAL OBSTETRICS VISIT ENCOUNTER NOTE  I connected with Jackie Newman on 06/30/20 at  2:30 PM EDT by My Chart video at home and verified that I am speaking with the correct person using two identifiers. Provider located at [facility] for [employer] at [place].   I discussed the limitations, risks, security and privacy concerns of performing an evaluation and management service by My Chart video and the availability of in person appointments. I also discussed with the patient that there may be a patient responsible charge related to this service. The patient expressed understanding and agreed to proceed.  Subjective:  Jackie Newman is a 29 y.o. G2P0010 at [redacted]w[redacted]d being followed for ongoing prenatal care.  She is currently monitored for the following issues for this high-risk pregnancy and has Morning sickness; Supervision of other normal pregnancy, antepartum; Trichomonas vaginalis (TV) infection; Chronic hypertension affecting pregnancy; LGSIL on Pap smear of cervix; and Supervision of high risk pregnancy in third trimester on their problem list.  Patient reports occasional pelvic cramps while changing positions in bed. Reports fetal movement. Denies any contractions, bleeding or leaking of fluid.   The following portions of the patient's history were reviewed and updated as appropriate: allergies, current medications, past family history, past medical history, past social history, past surgical history and problem list.   Objective:   General:  Alert, oriented and cooperative.   Mental Status: Normal mood and affect perceived. Normal judgment and thought content.  Rest of physical exam deferred due to type of encounter  BP 120/76   Pulse 76   Wt 196 lb 12.8 oz (89.3 kg)   LMP 12/11/2019   BMI 32.75 kg/m  **Done by patient's own at home BP cuff and scale Repeated in office: BP 120/76   P 76 Assessment and Plan:  Pregnancy: G2P0010 at [redacted]w[redacted]d  1.  Supervision of high risk pregnancy in third trimester - Discussed the normal variation of pelvic pain/pressure during pregnancy - Advised to sleep with pillows under abdomen and in between knees while side-lying  2. Chronic hypertension affecting pregnancy - Taking Labetalol 200 mg BID - Came in office for in-person BP check -- nmL BP in office >> 120/76 - In-person visits only d/t faulty BP cuff  Preterm labor symptoms and general obstetric precautions including but not limited to vaginal bleeding, contractions, leaking of fluid and fetal movement were reviewed in detail with the patient.  I discussed the assessment and treatment plan with the patient. The patient was provided an opportunity to ask questions and all were answered. The patient agreed with the plan and demonstrated an understanding of the instructions. The patient was advised to call back or seek an in-person office evaluation/go to [facility] at [facility] for any urgent or concerning symptoms. Please refer to After Visit Summary for other counseling recommendations.   I provided 10 minutes of non-face-to-face time during this encounter. There was 5 minutes of chart review time spent prior to this encounter. Total time spent = 15 minutes.  Return in about 2 weeks (around 07/11/2020) for Return OB visit.  Future Appointments  Date Time Provider Department Center  07/01/2020  8:30 AM [facility] NURSE [facility]  07/01/2020  8:45 AM [facility] US5 [facility]  07/11/2020  3:50 PM Raelyn Mora, CNM [facility] None  07/15/2020  2:30 PM [facility] NURSE [facility]  07/15/2020  2:45 PM [facility] US5 [facility]    Shavonna Corella Arita Miss, CNM Center for [facility] [facility], [facility]  Group

## 2020-06-30 ENCOUNTER — Encounter: Payer: Self-pay | Admitting: Obstetrics and Gynecology

## 2020-07-01 ENCOUNTER — Encounter: Payer: Self-pay | Admitting: *Deleted

## 2020-07-01 ENCOUNTER — Other Ambulatory Visit: Payer: Self-pay

## 2020-07-01 ENCOUNTER — Ambulatory Visit: Payer: Medicaid Other

## 2020-07-01 ENCOUNTER — Ambulatory Visit: Payer: Medicaid Other | Attending: Obstetrics

## 2020-07-01 ENCOUNTER — Other Ambulatory Visit: Payer: Self-pay | Admitting: Obstetrics

## 2020-07-01 ENCOUNTER — Ambulatory Visit: Payer: Medicaid Other | Admitting: *Deleted

## 2020-07-01 DIAGNOSIS — O403XX0 Polyhydramnios, third trimester, not applicable or unspecified: Secondary | ICD-10-CM

## 2020-07-01 DIAGNOSIS — Z3A29 29 weeks gestation of pregnancy: Secondary | ICD-10-CM | POA: Diagnosis not present

## 2020-07-01 DIAGNOSIS — A5901 Trichomonal vulvovaginitis: Secondary | ICD-10-CM | POA: Diagnosis present

## 2020-07-01 DIAGNOSIS — Z3480 Encounter for supervision of other normal pregnancy, unspecified trimester: Secondary | ICD-10-CM

## 2020-07-01 DIAGNOSIS — O210 Mild hyperemesis gravidarum: Secondary | ICD-10-CM | POA: Diagnosis present

## 2020-07-01 DIAGNOSIS — O10913 Unspecified pre-existing hypertension complicating pregnancy, third trimester: Secondary | ICD-10-CM | POA: Diagnosis not present

## 2020-07-11 ENCOUNTER — Other Ambulatory Visit: Payer: Self-pay

## 2020-07-11 ENCOUNTER — Ambulatory Visit (INDEPENDENT_AMBULATORY_CARE_PROVIDER_SITE_OTHER): Payer: Medicaid Other | Admitting: Obstetrics and Gynecology

## 2020-07-11 VITALS — BP 109/70 | HR 60 | Wt 199.0 lb

## 2020-07-11 DIAGNOSIS — O0993 Supervision of high risk pregnancy, unspecified, third trimester: Secondary | ICD-10-CM

## 2020-07-11 DIAGNOSIS — O10919 Unspecified pre-existing hypertension complicating pregnancy, unspecified trimester: Secondary | ICD-10-CM

## 2020-07-11 DIAGNOSIS — Z3A30 30 weeks gestation of pregnancy: Secondary | ICD-10-CM

## 2020-07-11 NOTE — Progress Notes (Signed)
  HIGH-RISK PREGNANCY OFFICE VISIT Patient name: Jackie Newman MRN [id number]  Date of birth: Apr 25, 1991 Chief Complaint:   Routine Prenatal Visit  History of Present Illness:   Jackie Newman is a 29 y.o. G73P0010 female at [redacted]w[redacted]d with an Estimated Date of Delivery: 09/16/20 being seen today for ongoing management of a high-risk pregnancy complicated by [facility] currently on Labetalol 200 mg BID & bASA 81 mg daily Today she reports no complaints. Contractions: Not present.  .  Movement: Present. denies leaking of fluid.  Review of Systems:   Pertinent items are noted in HPI Denies abnormal vaginal discharge w/ itching/odor/irritation, headaches, visual changes, shortness of breath, chest pain, abdominal pain, severe nausea/vomiting, or problems with urination or bowel movements unless otherwise stated above. Pertinent History Reviewed:  Reviewed past medical,surgical, social, obstetrical and family history.  Reviewed problem list, medications and allergies. Physical Assessment:   Vitals:   07/11/20 1548  BP: 109/70  Pulse: 60  Weight: 199 lb (90.3 kg)  Body mass index is 33.12 kg/m.           Physical Examination:   General appearance: alert, well appearing, and in no distress  Mental status: alert, oriented to person, place, and time  Skin: warm & dry   Extremities:      Cardiovascular: normal heart rate noted  Respiratory: normal respiratory effort, no distress  Abdomen: gravid, soft, non-tender  Pelvic: Cervical exam deferred         Fetal Status: Fetal Heart Rate (bpm): 154 Fundal Height: 32 cm Movement: Present    Fetal Surveillance Testing today: none   No results found for this or any previous visit (from the past 24 hour(s)).  Assessment & Plan:  1) High-risk pregnancy G2P0010 at [redacted]w[redacted]d with an Estimated Date of Delivery: 09/16/20   2) Supervision of high risk pregnancy in third trimester - Discussed normal to measure 2 cm above or below what current weeks are  3)  Chronic hypertension affecting pregnancy - Continue bASA and Labetalol as previously prescribed  - F/U U/S scheduled for 07/15/2020  4) [redacted] weeks gestation of pregnancy   Meds: No orders of the defined types were placed in this encounter.   Labs/procedures today: none  Treatment Plan:  Continue Labetalol 200 mg BID  Reviewed: Preterm labor symptoms and general obstetric precautions including but not limited to vaginal bleeding, contractions, leaking of fluid and fetal movement were reviewed in detail with the patient.  All questions were answered. Has home bp cuff. Check bp weekly, let us know if >140/90.   Follow-up: Return in about 2 weeks (around 07/25/2020) for Return OB visit.  No orders of the defined types were placed in this encounter.  Raelyn Mora MSN, CNM 07/11/2020 4:03 PM

## 2020-07-13 ENCOUNTER — Encounter: Payer: Self-pay | Admitting: Obstetrics and Gynecology

## 2020-07-15 ENCOUNTER — Encounter: Payer: Self-pay | Admitting: *Deleted

## 2020-07-15 ENCOUNTER — Other Ambulatory Visit: Payer: Self-pay

## 2020-07-15 ENCOUNTER — Ambulatory Visit: Payer: Medicaid Other | Admitting: *Deleted

## 2020-07-15 ENCOUNTER — Ambulatory Visit: Payer: Medicaid Other | Attending: Obstetrics

## 2020-07-15 DIAGNOSIS — A5901 Trichomonal vulvovaginitis: Secondary | ICD-10-CM | POA: Diagnosis present

## 2020-07-15 DIAGNOSIS — O10013 Pre-existing essential hypertension complicating pregnancy, third trimester: Secondary | ICD-10-CM

## 2020-07-15 DIAGNOSIS — Z362 Encounter for other antenatal screening follow-up: Secondary | ICD-10-CM | POA: Diagnosis not present

## 2020-07-15 DIAGNOSIS — Z3480 Encounter for supervision of other normal pregnancy, unspecified trimester: Secondary | ICD-10-CM

## 2020-07-15 DIAGNOSIS — O210 Mild hyperemesis gravidarum: Secondary | ICD-10-CM | POA: Diagnosis present

## 2020-07-15 DIAGNOSIS — O403XX1 Polyhydramnios, third trimester, fetus 1: Secondary | ICD-10-CM

## 2020-07-15 DIAGNOSIS — Z3A31 31 weeks gestation of pregnancy: Secondary | ICD-10-CM | POA: Diagnosis not present

## 2020-07-15 DIAGNOSIS — O403XX0 Polyhydramnios, third trimester, not applicable or unspecified: Secondary | ICD-10-CM | POA: Diagnosis present

## 2020-07-16 ENCOUNTER — Other Ambulatory Visit: Payer: Self-pay | Admitting: *Deleted

## 2020-07-16 DIAGNOSIS — O10919 Unspecified pre-existing hypertension complicating pregnancy, unspecified trimester: Secondary | ICD-10-CM

## 2020-07-24 ENCOUNTER — Encounter (HOSPITAL_COMMUNITY): Payer: Self-pay | Admitting: Obstetrics and Gynecology

## 2020-07-24 ENCOUNTER — Ambulatory Visit (HOSPITAL_BASED_OUTPATIENT_CLINIC_OR_DEPARTMENT_OTHER): Payer: Medicaid Other

## 2020-07-24 ENCOUNTER — Ambulatory Visit: Payer: Medicaid Other | Admitting: *Deleted

## 2020-07-24 ENCOUNTER — Other Ambulatory Visit: Payer: Self-pay

## 2020-07-24 ENCOUNTER — Inpatient Hospital Stay (HOSPITAL_COMMUNITY)
Admission: AD | Admit: 2020-07-24 | Discharge: 2020-07-26 | DRG: 833 | Disposition: A | Discharge status: Home / Self Care | Payer: Medicaid Other | Attending: Obstetrics and Gynecology | Admitting: Obstetrics and Gynecology

## 2020-07-24 DIAGNOSIS — J069 Acute upper respiratory infection, unspecified: Secondary | ICD-10-CM | POA: Diagnosis present

## 2020-07-24 DIAGNOSIS — Z3480 Encounter for supervision of other normal pregnancy, unspecified trimester: Secondary | ICD-10-CM | POA: Insufficient documentation

## 2020-07-24 DIAGNOSIS — O10913 Unspecified pre-existing hypertension complicating pregnancy, third trimester: Secondary | ICD-10-CM

## 2020-07-24 DIAGNOSIS — O210 Mild hyperemesis gravidarum: Secondary | ICD-10-CM | POA: Insufficient documentation

## 2020-07-24 DIAGNOSIS — O10919 Unspecified pre-existing hypertension complicating pregnancy, unspecified trimester: Secondary | ICD-10-CM

## 2020-07-24 DIAGNOSIS — Z20822 Contact with and (suspected) exposure to covid-19: Secondary | ICD-10-CM | POA: Diagnosis present

## 2020-07-24 DIAGNOSIS — R748 Abnormal levels of other serum enzymes: Secondary | ICD-10-CM | POA: Diagnosis present

## 2020-07-24 DIAGNOSIS — Z3A32 32 weeks gestation of pregnancy: Secondary | ICD-10-CM | POA: Diagnosis not present

## 2020-07-24 DIAGNOSIS — O403XX1 Polyhydramnios, third trimester, fetus 1: Secondary | ICD-10-CM | POA: Diagnosis not present

## 2020-07-24 DIAGNOSIS — R7401 Elevation of levels of liver transaminase levels: Secondary | ICD-10-CM | POA: Diagnosis present

## 2020-07-24 DIAGNOSIS — Z7722 Contact with and (suspected) exposure to environmental tobacco smoke (acute) (chronic): Secondary | ICD-10-CM | POA: Diagnosis present

## 2020-07-24 DIAGNOSIS — A5901 Trichomonal vulvovaginitis: Secondary | ICD-10-CM | POA: Insufficient documentation

## 2020-07-24 DIAGNOSIS — O99513 Diseases of the respiratory system complicating pregnancy, third trimester: Principal | ICD-10-CM | POA: Diagnosis present

## 2020-07-24 DIAGNOSIS — R1010 Upper abdominal pain, unspecified: Secondary | ICD-10-CM

## 2020-07-24 DIAGNOSIS — O409XX0 Polyhydramnios, unspecified trimester, not applicable or unspecified: Secondary | ICD-10-CM | POA: Diagnosis present

## 2020-07-24 DIAGNOSIS — O403XX0 Polyhydramnios, third trimester, not applicable or unspecified: Secondary | ICD-10-CM | POA: Diagnosis present

## 2020-07-24 DIAGNOSIS — O212 Late vomiting of pregnancy: Secondary | ICD-10-CM | POA: Diagnosis present

## 2020-07-24 LAB — CBC WITH DIFFERENTIAL/PLATELET
Abs Immature Granulocytes: 0.29 10*3/uL — ABNORMAL HIGH (ref 0.00–0.07)
Basophils Absolute: 0.1 10*3/uL (ref 0.0–0.1)
Basophils Relative: 1 %
Eosinophils Absolute: 0.1 10*3/uL (ref 0.0–0.5)
Eosinophils Relative: 0 %
HCT: 40.2 % (ref 36.0–46.0)
Hemoglobin: 13.7 g/dL (ref 12.0–15.0)
Immature Granulocytes: 2 %
Lymphocytes Relative: 11 %
Lymphs Abs: 1.7 10*3/uL (ref 0.7–4.0)
MCH: 31.1 pg (ref 26.0–34.0)
MCHC: 34.1 g/dL (ref 30.0–36.0)
MCV: 91.2 fL (ref 80.0–100.0)
Monocytes Absolute: 1.2 10*3/uL — ABNORMAL HIGH (ref 0.1–1.0)
Monocytes Relative: 7 %
Neutro Abs: 12.5 10*3/uL — ABNORMAL HIGH (ref 1.7–7.7)
Neutrophils Relative %: 79 %
Platelets: 293 10*3/uL (ref 150–400)
RBC: 4.41 MIL/uL (ref 3.87–5.11)
RDW: 13.7 % (ref 11.5–15.5)
WBC: 15.8 10*3/uL — ABNORMAL HIGH (ref 4.0–10.5)
nRBC: 0 % (ref 0.0–0.2)

## 2020-07-24 LAB — COMPREHENSIVE METABOLIC PANEL
ALT: 42 U/L (ref 0–44)
AST: 46 U/L — ABNORMAL HIGH (ref 15–41)
Albumin: 3.3 g/dL — ABNORMAL LOW (ref 3.5–5.0)
Alkaline Phosphatase: 104 U/L (ref 38–126)
Anion gap: 15 (ref 5–15)
BUN: 5 mg/dL — ABNORMAL LOW (ref 6–20)
CO2: 17 mmol/L — ABNORMAL LOW (ref 22–32)
Calcium: 9.7 mg/dL (ref 8.9–10.3)
Chloride: 103 mmol/L (ref 98–111)
Creatinine, Ser: 0.61 mg/dL (ref 0.44–1.00)
GFR calc Af Amer: >60 mL/min (ref >60)
GFR calc non Af Amer: >60 mL/min (ref >60)
Glucose, Bld: 90 mg/dL (ref 70–99)
Potassium: 3.6 mmol/L (ref 3.5–5.1)
Sodium: 135 mmol/L (ref 135–145)
Total Bilirubin: 0.8 mg/dL (ref 0.3–1.2)
Total Protein: 7.5 g/dL (ref 6.5–8.1)

## 2020-07-24 LAB — SARS CORONAVIRUS 2 BY RT PCR (HOSPITAL ORDER, PERFORMED IN ~~LOC~~ HOSPITAL LAB): SARS Coronavirus 2: NEGATIVE

## 2020-07-24 LAB — AMYLASE: Amylase: 168 U/L — ABNORMAL HIGH (ref 28–100)

## 2020-07-24 LAB — LIPASE, BLOOD: Lipase: 25 U/L (ref 11–51)

## 2020-07-24 MED ORDER — PROMETHAZINE HCL 25 MG/ML IJ SOLN
12.5000 mg | Freq: Once | INTRAMUSCULAR | Status: AC
  Administered 2020-07-24: 12.5 mg via INTRAVENOUS
  Filled 2020-07-24: qty 1

## 2020-07-24 MED ORDER — LACTATED RINGERS IV SOLN: INTRAVENOUS | Status: DC

## 2020-07-24 MED ORDER — FLUTICASONE PROPIONATE 50 MCG/ACT NA SUSP
2.0000 | Freq: Every day | NASAL | Status: DC
  Administered 2020-07-24 – 2020-07-25 (×2): 2 via NASAL
  Filled 2020-07-24: qty 16

## 2020-07-24 MED ORDER — HYOSCYAMINE SULFATE 0.125 MG SL SUBL
0.2500 mg | SUBLINGUAL_TABLET | Freq: Once | SUBLINGUAL | Status: AC
  Administered 2020-07-24: 0.25 mg via SUBLINGUAL
  Filled 2020-07-24: qty 2

## 2020-07-24 MED ORDER — ONDANSETRON HCL 4 MG/2ML IJ SOLN
4.0000 mg | Freq: Once | INTRAMUSCULAR | Status: AC
  Administered 2020-07-24: 4 mg via INTRAVENOUS
  Filled 2020-07-24: qty 2

## 2020-07-24 MED ORDER — HYOSCYAMINE SULFATE 0.5 MG/ML IJ SOLN: 0.2500 mg | Freq: Once | INTRAMUSCULAR | Status: DC

## 2020-07-24 NOTE — MAU Note (Signed)
Jackie Newman is a 29 y.o. at [redacted]w[redacted]d here in MAU reporting: abdominal pain, vomiting, and loose stool, unable to urinate  Onset of complaint: yesterday Pain score: 10 There were no vitals filed for this visit.

## 2020-07-25 ENCOUNTER — Telehealth: Payer: Self-pay | Admitting: Obstetrics and Gynecology

## 2020-07-25 ENCOUNTER — Encounter: Payer: Medicaid Other | Admitting: Obstetrics and Gynecology

## 2020-07-25 ENCOUNTER — Inpatient Hospital Stay (HOSPITAL_COMMUNITY): Payer: Medicaid Other

## 2020-07-25 DIAGNOSIS — O99513 Diseases of the respiratory system complicating pregnancy, third trimester: Principal | ICD-10-CM

## 2020-07-25 DIAGNOSIS — J069 Acute upper respiratory infection, unspecified: Secondary | ICD-10-CM

## 2020-07-25 DIAGNOSIS — R7401 Elevation of levels of liver transaminase levels: Secondary | ICD-10-CM | POA: Diagnosis not present

## 2020-07-25 DIAGNOSIS — O219 Vomiting of pregnancy, unspecified: Secondary | ICD-10-CM

## 2020-07-25 DIAGNOSIS — O4703 False labor before 37 completed weeks of gestation, third trimester: Secondary | ICD-10-CM | POA: Diagnosis not present

## 2020-07-25 DIAGNOSIS — O403XX0 Polyhydramnios, third trimester, not applicable or unspecified: Secondary | ICD-10-CM | POA: Diagnosis present

## 2020-07-25 DIAGNOSIS — Z3A32 32 weeks gestation of pregnancy: Secondary | ICD-10-CM | POA: Diagnosis not present

## 2020-07-25 DIAGNOSIS — O212 Late vomiting of pregnancy: Secondary | ICD-10-CM | POA: Diagnosis present

## 2020-07-25 DIAGNOSIS — O409XX0 Polyhydramnios, unspecified trimester, not applicable or unspecified: Secondary | ICD-10-CM | POA: Diagnosis present

## 2020-07-25 DIAGNOSIS — R748 Abnormal levels of other serum enzymes: Secondary | ICD-10-CM | POA: Diagnosis not present

## 2020-07-25 DIAGNOSIS — Z20822 Contact with and (suspected) exposure to covid-19: Secondary | ICD-10-CM | POA: Diagnosis present

## 2020-07-25 DIAGNOSIS — Z7722 Contact with and (suspected) exposure to environmental tobacco smoke (acute) (chronic): Secondary | ICD-10-CM | POA: Diagnosis present

## 2020-07-25 DIAGNOSIS — R109 Unspecified abdominal pain: Secondary | ICD-10-CM | POA: Diagnosis present

## 2020-07-25 DIAGNOSIS — O99891 Other specified diseases and conditions complicating pregnancy: Secondary | ICD-10-CM | POA: Diagnosis not present

## 2020-07-25 LAB — TYPE AND SCREEN
ABO/RH(D): O POS
Antibody Screen: NEGATIVE

## 2020-07-25 LAB — RESPIRATORY PANEL BY PCR

## 2020-07-25 LAB — GC/CHLAMYDIA PROBE AMP (~~LOC~~) NOT AT ARMC
Chlamydia: NEGATIVE
Comment: NEGATIVE
Comment: NORMAL
Neisseria Gonorrhea: NEGATIVE

## 2020-07-25 LAB — FETAL FIBRONECTIN: Fetal Fibronectin: POSITIVE — AB

## 2020-07-25 MED ORDER — MAGNESIUM SULFATE BOLUS VIA INFUSION
4.0000 g | Freq: Once | INTRAVENOUS | Status: AC
  Administered 2020-07-25: 4 g via INTRAVENOUS
  Filled 2020-07-25: qty 1000

## 2020-07-25 MED ORDER — BETAMETHASONE SOD PHOS & ACET 6 (3-3) MG/ML IJ SUSP
12.0000 mg | Freq: Once | INTRAMUSCULAR | Status: DC
  Filled 2020-07-25: qty 5

## 2020-07-25 MED ORDER — ASPIRIN 81 MG PO CHEW
81.0000 mg | CHEWABLE_TABLET | Freq: Every day | ORAL | Status: DC
  Administered 2020-07-25 – 2020-07-26 (×2): 81 mg via ORAL
  Filled 2020-07-25 (×2): qty 1

## 2020-07-25 MED ORDER — DOCUSATE SODIUM 100 MG PO CAPS: 100.0000 mg | ORAL_CAPSULE | Freq: Two times a day (BID) | ORAL | Status: DC | PRN

## 2020-07-25 MED ORDER — BETAMETHASONE SOD PHOS & ACET 6 (3-3) MG/ML IJ SUSP
12.0000 mg | Freq: Once | INTRAMUSCULAR | Status: AC
  Administered 2020-07-25: 12 mg via INTRAMUSCULAR
  Filled 2020-07-25: qty 5

## 2020-07-25 MED ORDER — CALCIUM CARBONATE ANTACID 500 MG PO CHEW: 2.0000 | CHEWABLE_TABLET | ORAL | Status: DC | PRN

## 2020-07-25 MED ORDER — ZOLPIDEM TARTRATE 5 MG PO TABS: 5.0000 mg | ORAL_TABLET | Freq: Every evening | ORAL | Status: DC | PRN

## 2020-07-25 MED ORDER — LABETALOL HCL 200 MG PO TABS
200.0000 mg | ORAL_TABLET | Freq: Two times a day (BID) | ORAL | Status: DC
  Administered 2020-07-25 – 2020-07-26 (×3): 200 mg via ORAL
  Filled 2020-07-25 (×3): qty 1

## 2020-07-25 MED ORDER — ACETAMINOPHEN 325 MG PO TABS: 650.0000 mg | ORAL_TABLET | ORAL | Status: DC | PRN

## 2020-07-25 MED ORDER — NIFEDIPINE 10 MG PO CAPS
10.0000 mg | ORAL_CAPSULE | ORAL | Status: DC | PRN
  Administered 2020-07-25: 10 mg via ORAL
  Filled 2020-07-25 (×2): qty 1

## 2020-07-25 MED ORDER — MAGNESIUM SULFATE 40 GM/1000ML IV SOLN
2.0000 g/h | INTRAVENOUS | Status: DC
  Administered 2020-07-25: 2 g/h via INTRAVENOUS
  Filled 2020-07-25: qty 1000

## 2020-07-25 MED ORDER — ALBUTEROL SULFATE (2.5 MG/3ML) 0.083% IN NEBU: 3.0000 mL | INHALATION_SOLUTION | Freq: Four times a day (QID) | RESPIRATORY_TRACT | Status: DC | PRN

## 2020-07-25 MED ORDER — PRENATAL MULTIVITAMIN CH
1.0000 | ORAL_TABLET | Freq: Every day | ORAL | Status: DC
  Administered 2020-07-26: 1 via ORAL
  Filled 2020-07-25: qty 1

## 2020-07-25 MED ORDER — TERBUTALINE SULFATE 1 MG/ML IJ SOLN
0.2500 mg | Freq: Once | INTRAMUSCULAR | Status: AC
  Administered 2020-07-25: 0.25 mg via SUBCUTANEOUS
  Filled 2020-07-25: qty 1

## 2020-07-25 MED ORDER — PROMETHAZINE HCL 25 MG PO TABS: 12.5000 mg | ORAL_TABLET | Freq: Four times a day (QID) | ORAL | Status: DC | PRN

## 2020-07-25 MED ORDER — ONDANSETRON HCL 4 MG/2ML IJ SOLN
4.0000 mg | Freq: Once | INTRAMUSCULAR | Status: AC
  Administered 2020-07-25: 4 mg via INTRAVENOUS
  Filled 2020-07-25: qty 2

## 2020-07-25 MED ORDER — LACTATED RINGERS IV SOLN: INTRAVENOUS | Status: DC

## 2020-07-25 NOTE — MAU Provider Note (Signed)
Chief Complaint:  Abdominal Pain, Nausea, and Emesis   First Provider Initiated Contact with Patient 07/24/20 2218     HPI: Jackie Newman is a 28 y.o. G2P0010 at 32w3dwho presents via EMS to maternity admissions reporting nausea, vomiting, abdominal pain, loose stools, and nasal congestion. 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  On admission she it retching and uncomfortable.  . She reports good fetal movement, denies LOF, vaginal bleeding, vaginal itching/burning, urinary symptoms, h/a, dizziness, constipation or fever/chills.  She denies headache, visual changes or RUQ abdominal pain.  Since she had new symptoms and significant nasal congestion, we initiated precautions and tested for Covid.  Abdominal Pain This is a recurrent problem. The current episode started today. The problem occurs intermittently. The problem has been unchanged. The pain is located in the generalized abdominal region. The pain is moderate. The quality of the pain is cramping. The abdominal pain does not radiate. Associated symptoms include diarrhea, nausea and vomiting. Pertinent negatives include no constipation, dysuria, fever, frequency, headaches or myalgias. Nothing aggravates the pain. The pain is relieved by nothing. She has tried nothing for the symptoms.  Emesis  This is a recurrent problem. The current episode started today. The problem occurs intermittently. The problem has been unchanged. There has been no fever. Associated symptoms include abdominal pain, diarrhea and URI. Pertinent negatives include no chills, fever, headaches or myalgias. She has tried nothing for the symptoms.    RN Note: Jackie Newman is a 28 y.o. at [redacted]w[redacted]d here in MAU reporting: abdominal pain, vomiting, and loose stool, unable to urinate  Onset of complaint: yesterday Pain score: 10  Past Medical History: Past Medical History:  Diagnosis Date  . Asthma   . Congenital umbilical hernia   .  Hypertension   . Ovarian cyst   . Seizures (HCC)    No follow up. Patient stated she had one seizure during her 23st birthday drinking alcohol.    Past obstetric history: OB History  Gravida Para Term Preterm AB Living  2       1 0  SAB TAB Ectopic Multiple Live Births  1            # Outcome Date GA Lbr Len/2nd Weight Sex Delivery Anes PTL Lv  2 Current           1 SAB 2015            Past Surgical History: Past Surgical History:  Procedure Laterality Date  . COLONOSCOPY WITH ESOPHAGOGASTRODUODENOSCOPY (EGD)  12/04/2019    Family History: Family History  Problem Relation Age of Onset  . Asthma Father   . Gout Father     Social History: Social History   Tobacco Use  . Smoking status: Passive Smoke Exposure - Never Smoker  . Smokeless tobacco: Never Used  Vaping Use  . Vaping Use: Never used  Substance Use Topics  . Alcohol use: No    Comment: occasional socially  . Drug use: Not Currently    Types: Marijuana    Comment: quit 3-4 weeks ago    Allergies:  Allergies  Allergen Reactions  . Doxycycline Nausea And Vomiting    Meds:  Medications Prior to Admission  Medication Sig Dispense Refill Last Dose  . aspirin 81 MG chewable tablet Chew 1 tablet (81 mg total) by mouth daily. 30 tablet 6 07/23/2020 at Unknown time  . labetalol (NORMODYNE) 200 MG tablet Take 1 tablet (200 mg total) by mouth 2 (two)   times daily. 60 tablet 3 07/23/2020 at Unknown time  . promethazine (PHENERGAN) 25 MG tablet Take 0.5-1 tablets (12.5-25 mg total) by mouth every 6 (six) hours as needed for nausea or vomiting. 30 tablet 0 07/23/2020 at Unknown time  . albuterol (VENTOLIN HFA) 108 (90 Base) MCG/ACT inhaler Inhale 1-2 puffs into the lungs every 6 (six) hours as needed for wheezing or shortness of breath. 18 g 3   . Blood Pressure Monitoring (BLOOD PRESSURE MONITOR AUTOMAT) DEVI 1 Device by Does not apply route daily. Automatic blood pressure cuff regular size. To monitor blood pressure  regularly at home. ICD-10 code: O09.90 (Patient not taking: Reported on 07/11/2020) 1 each 0   . butalbital-acetaminophen-caffeine (FIORICET) 50-325-40 MG tablet Take 1-2 tablets by mouth every 6 (six) hours as needed for headache. (Patient not taking: Reported on 05/30/2020) 6 tablet 0   . Elastic Bandages & Supports (WRIST SPLINT/COCK-UP/LEFT L) MISC 1 Device by Does not apply route 3 times/day as needed-between meals & bedtime. 1 each 0   . Elastic Bandages & Supports (WRIST SPLINT/COCK-UP/RIGHT L) MISC 1 Device by Does not apply route as needed. 1 each 0   . Misc. Devices (GOJJI WEIGHT SCALE) MISC 1 Device by Does not apply route daily as needed. To weight self daily as needed at home. ICD-10 code: O09.90 1 each 0   . terconazole (TERAZOL 7) 0.4 % vaginal cream Place 1 applicator vaginally at bedtime. (Patient not taking: Reported on 07/11/2020) 45 g 0     I have reviewed patient's Past Medical Hx, Surgical Hx, Family Hx, Social Hx, medications and allergies.   ROS:  Review of Systems  Constitutional: Negative for chills and fever.  Gastrointestinal: Positive for abdominal pain, diarrhea, nausea and vomiting. Negative for constipation.  Genitourinary: Negative for dysuria and frequency.  Musculoskeletal: Negative for myalgias.  Neurological: Negative for headaches.   Other systems negative  Physical Exam   Patient Vitals for the past 24 hrs:  BP Temp Temp src Pulse Resp SpO2  07/25/20 0145 130/70 -- -- -- -- --  07/24/20 2230 125/77 -- -- (!) 121 -- --  07/24/20 2200 135/74 98.8 F (37.1 C) Oral (!) 131 20 98 %   Vitals:   07/24/20 2200 07/24/20 2230 07/25/20 0145  BP: 135/74 125/77 130/70  Pulse: (!) 131 (!) 121   Resp: 20    Temp: 98.8 F (37.1 C)    TempSrc: Oral    SpO2: 98%      Constitutional: Well-developed, well-nourished female in no acute distress, but retching and uncomfortable with abdominal cramping.   Significant nasal congestion.  .  Cardiovascular: normal rate  and rhythm Respiratory: normal effort, clear to auscultation bilaterally GI: Abd soft, non-tender, gravid appropriate for gestational age.   No rebound or guarding. MS: Extremities nontender, no edema, normal ROM Neurologic: Alert and oriented x 4.  GU: Neg CVAT.  PELVIC EXAM:  Dilation: Closed Effacement (%): Thick Cervical Position: Posterior Station: Ballotable Exam by:: Ryanna Teschner, CNM  Dilation: 1 Effacement (%): 10 Cervical Position: Posterior Station: Ballotable Exam by:: Baily Hovanec CNM  FHT:  Baseline 180 (while pt vomiting) , moderate variability, accelerations present, no decelerations Contractions:  Irregular Difficult to trace due to pt vomiting   Labs: Results for orders placed or performed during the hospital encounter of 07/24/20 (from the past 24 hour(s))  CBC with Differential/Platelet     Status: Abnormal   Collection Time: 07/24/20 10:04 PM  Result Value Ref Range   WBC 15.8 (  H) 4.0 - 10.5 K/uL   RBC 4.41 3.87 - 5.11 MIL/uL   Hemoglobin 13.7 12.0 - 15.0 g/dL   HCT 40.2 36 - 46 %   MCV 91.2 80.0 - 100.0 fL   MCH 31.1 26.0 - 34.0 pg   MCHC 34.1 30.0 - 36.0 g/dL   RDW 13.7 11.5 - 15.5 %   Platelets 293 150 - 400 K/uL   nRBC 0.0 0.0 - 0.2 %   Neutrophils Relative % 79 %   Neutro Abs 12.5 (H) 1.7 - 7.7 K/uL   Lymphocytes Relative 11 %   Lymphs Abs 1.7 0.7 - 4.0 K/uL   Monocytes Relative 7 %   Monocytes Absolute 1.2 (H) 0 - 1 K/uL   Eosinophils Relative 0 %   Eosinophils Absolute 0.1 0 - 0 K/uL   Basophils Relative 1 %   Basophils Absolute 0.1 0 - 0 K/uL   Immature Granulocytes 2 %   Abs Immature Granulocytes 0.29 (H) 0.00 - 0.07 K/uL  Comprehensive metabolic panel     Status: Abnormal   Collection Time: 07/24/20 10:04 PM  Result Value Ref Range   Sodium 135 135 - 145 mmol/L   Potassium 3.6 3.5 - 5.1 mmol/L   Chloride 103 98 - 111 mmol/L   CO2 17 (L) 22 - 32 mmol/L   Glucose, Bld 90 70 - 99 mg/dL   BUN 5 (L) 6 - 20 mg/dL   Creatinine, Ser 0.61  0.44 - 1.00 mg/dL   Calcium 9.7 8.9 - 10.3 mg/dL   Total Protein 7.5 6.5 - 8.1 g/dL   Albumin 3.3 (L) 3.5 - 5.0 g/dL   AST 46 (H) 15 - 41 U/L   ALT 42 0 - 44 U/L   Alkaline Phosphatase 104 38 - 126 U/L   Total Bilirubin 0.8 0.3 - 1.2 mg/dL   GFR calc non Af Amer >60 >60 mL/min   GFR calc Af Amer >60 >60 mL/min   Anion gap 15 5 - 15  Amylase     Status: Abnormal   Collection Time: 07/24/20 10:04 PM  Result Value Ref Range   Amylase 168 (H) 28 - 100 U/L  Lipase, blood     Status: None   Collection Time: 07/24/20 10:04 PM  Result Value Ref Range   Lipase 25 11 - 51 U/L  SARS Coronavirus 2 by RT PCR (hospital order, performed in [place] hospital lab) Nasopharyngeal Nasopharyngeal Swab     Status: None   Collection Time: 07/24/20 10:05 PM   Specimen: Nasopharyngeal Swab  Result Value Ref Range   SARS Coronavirus 2 NEGATIVE NEGATIVE  Fetal fibronectin     Status: Abnormal   Collection Time: 07/25/20 12:54 AM  Result Value Ref Range   Fetal Fibronectin POSITIVE (A) NEGATIVE   O/Positive/-- (04/07 1541)  Imaging:  US MFM FETAL BPP WO NON STRESS  Result Date: 07/24/2020 ----------------------------------------------------------------------  OBSTETRICS REPORT                       (Signed Final 07/24/2020 02:39 pm) ---------------------------------------------------------------------- Patient Info  ID #:       [id number]                          D.O.B.:  01/11/1991 (28 yrs)  Name:       Jackie Newman                Visit Date: 07/24/2020 09:22 am ----------------------------------------------------------------------   Performed By  Attending:        Corenthian Booker      Referred By:      [facility]                    MD  Performed By:     Hannah Bazemore        Location[facility]                    [facility]                                     [facility] ---------------------------------------------------------------------- Orders  #  Description                           Code        Ordered By  1  US MFM FETAL BPP WO NON               76819.01    RAVI SHANKAR     STRESS ----------------------------------------------------------------------  #  Order #                     Accession #                Episode #  1  [id number]                   [id number]                 [id number] ---------------------------------------------------------------------- Indications  Polyhydramnios, third trimester, antepartum    O40.3XX1  condition or complication, fetus 1  Hypertension - Chronic/Pre-existing            O10.019  (Labetalol)  Low Risk NIPS, Neg Horizon & Neg AFP  [redacted] weeks gestation of pregnancy                Z3A.32 ---------------------------------------------------------------------- Fetal Evaluation  Num Of Fetuses:         1  Fetal Heart Rate(bpm):  131  Cardiac Activity:       Observed  Presentation:           Cephalic  Placenta:               Anterior  P. Cord Insertion:      Previously Visualized  Amniotic Fluid  AFI FV:      Polyhydramnios  AFI Sum(cm)     %Tile       Largest Pocket(cm)  25.36           96            8.8  RUQ(cm)       RLQ(cm)       LUQ(cm)        LLQ(cm)  4.16          7.9           4.5            8.8 ---------------------------------------------------------------------- Biophysical Evaluation  Amniotic F.V:   Within normal limits       F. Tone:        Observed  F. Movement:    Observed                   Score:          8/8  F. Breathing:   Observed ---------------------------------------------------------------------- OB History  Gravidity:    2         Term:   0         SAB:   1  Living:       0 ---------------------------------------------------------------------- Gestational Age  LMP:           32w 2d        Date:  12/11/19                 EDD:   09/16/20  Best:          32w 2d     Det. By:  LMP  (12/11/19)          EDD:    09/16/20 ---------------------------------------------------------------------- Anatomy  Thoracic:              Appears normal         Bladder:                Appears normal  Stomach:               Appears normal, left                         sided ---------------------------------------------------------------------- Cervix Uterus Adnexa  Cervix  Not visualized (advanced GA >24wks) ---------------------------------------------------------------------- Impression  Antenatal testing due to polyhydramnios  Chronic hypertension on antihypertensive therapy.  Biophsical profile 8/8 with good fetal movement and elevated  amniotic fluid volume. ---------------------------------------------------------------------- Recommendations  Continue weekly testing. ----------------------------------------------------------------------               Corenthian Booker, MD Electronically Signed Final Report   07/24/2020 02:39 pm ----------------------------------------------------------------------  US MFM FETAL BPP WO NON STRESS  Result Date: 07/15/2020 ----------------------------------------------------------------------  OBSTETRICS REPORT                       (Signed Final 07/15/2020 04:03 pm) ---------------------------------------------------------------------- Patient Info  ID #:       [id number]                          D.O.B.:  08/12/1991 (28 yrs)  Name:       Jackie Newman                Visit Date: 07/15/2020 03:50 pm ---------------------------------------------------------------------- Performed By  Attending:        Ravi Shankar MD        Referred By:      [facility]  Performed By:     Jovancia Adrien        Location:         [facility]                      [facility]                                     [facility]  ---------------------------------------------------------------------- Orders  #  Description                           Code        Ordered By  1  US MFM FETAL BPP WO NON               76819.01    YU FANG     STRESS  2  US MFM OB FOLLOW UP                   76816.01    YU FANG ----------------------------------------------------------------------  #  Order #                     Accession #                Episode #  1  [id number]                   [id number]                 [id number]  2  [id number]                   [id number]                 [id number] ---------------------------------------------------------------------- Indications  Encounter for other antenatal screening        Z36.2  follow-up  Polyhydramnios, third trimester, antepartum    O40.3XX1  condition or complication, fetus 1  Hypertension - Chronic/Pre-existing            O10.019  (Labetalol)  Low Risk NIPS, Neg Horizon & Neg AFP  [redacted] weeks gestation of pregnancy                Z3A.31 ---------------------------------------------------------------------- Fetal Evaluation  Num Of Fetuses:         1  Fetal Heart Rate(bpm):  150  Cardiac Activity:       Observed  Presentation:           Cephalic  Placenta:               Anterior  P. Cord Insertion:      Visualized  Amniotic Fluid  AFI FV:      Within normal limits  AFI Sum(cm)     %Tile       Largest Pocket(cm)  23.74           95            9.01  RUQ(cm)       RLQ(cm)       LUQ(cm)        LLQ(cm)  9.01          3.65          8.21           2.87 ---------------------------------------------------------------------- Biophysical Evaluation  Amniotic F.V:   Pocket => 2 cm             F. Tone:        Observed  F. Movement:    Observed                   Score:          8/8  F. Breathing:   Observed ---------------------------------------------------------------------- Biometry  BPD:      84.1  mm     G. Age:  33w 6d         98  %    CI:        80.23   %    70 - 86                                                           FL/HC:      19.7   %    19.3 - 21.3  HC:      296.6  mm     G. Age:  32w 6d         64  %    HC/AC:      1.07        0.96 - 1.17  AC:      277.7  mm     G. Age:  31w 6d         71  %    FL/BPD:     69.4   %    71 - 87  FL:       58.4  mm     G. Age:  30w 4d         23  %    FL/AC:      21.0   %    20 - 24  HUM:        51  mm     G. Age:  29w 6d         28  %  LV:        4.4  mm  Est. FW:    1818  gm           4 lb     62  % ---------------------------------------------------------------------- OB History  Gravidity:    2         Term:   0         SAB:   1  Living:       0 ---------------------------------------------------------------------- Gestational Age  LMP:           31w 0d        Date:  12/11/19                 EDD:   09/16/20  U/S Today:     32w 2d                                          EDD:   09/07/20  Best:          31w 0d     Det. By:  LMP  (12/11/19)          EDD:   09/16/20 ---------------------------------------------------------------------- Anatomy  Cranium:               Appears normal         Aortic Arch:            Previously seen  Cavum:                 Appears normal         Ductal Arch:            Previously seen  Ventricles:            Appears normal         Diaphragm:              Appears normal  Choroid Plexus:        Previously seen        Stomach:                Appears normal, left                                                                        sided  Cerebellum:            Previously seen        Abdomen:                Appears normal  Posterior Fossa:       Previously seen        Abdominal Wall:         Previously seen  Nuchal Fold:           Not applicable (>20    Cord Vessels:           Previously seen                         wks GA)  Face:                  Orbits and profile     Kidneys:                Appear normal                         previously seen  Lips:                  Previously seen        Bladder:                Appears normal  Thoracic:              Appears  normal         Spine:                  Previously seen  Heart:                 Previously seen        Upper Extremities:        Previously seen  RVOT:                  Previously seen        Lower Extremities:      Previously seen  LVOT:                  Previously seen ---------------------------------------------------------------------- Impression  Chronic hypertension.  Well controlled on labetalol.  Patient  takes labetalol 200 mg twice daily.  Blood pressure today at  our office is 120/74 mmHg.  Fetal growth is appropriate for gestational age .Amniotic fluid  is normal and good fetal activity is seen .Antenatal testing is  reassuring. BPP 8/8.  We reassured the patient of the findings. ---------------------------------------------------------------------- Recommendations  -Continue weekly BPP till delivery. ----------------------------------------------------------------------                  Ravi Shankar, MD Electronically Signed Final Report   07/15/2020 04:03 pm ----------------------------------------------------------------------  US MFM FETAL BPP WO NON STRESS  Result Date: 07/01/2020 ----------------------------------------------------------------------  OBSTETRICS REPORT                       (Signed Final 07/01/2020 09:38 am) ---------------------------------------------------------------------- Patient Info  ID #:       [id number]                          D.O.B.:  01/19/1991 (28 yrs)  Name:       Jackie Newman                Visit Date: 07/01/2020 09:05 am ---------------------------------------------------------------------- Performed By  Attending:        Victor Fang MD         Referred By:      [facility]  Performed By:     Jovancia Adrien        Location[facility]                                     [facility] ---------------------------------------------------------------------- Orders  #  Description                           Code        Ordered By  1  US MFM OB LIMITED                     76815.01    YU FANG  2    US MFM FETAL BPP WO NON               76819.01    YU FANG     STRESS ----------------------------------------------------------------------  #  Order #                     Accession #                Episode #  1  [id number]                   [id number]                 [id number]  2  [id number]                   [id number]                 [id number] ---------------------------------------------------------------------- Indications  Polyhydramnios, third trimester, antepartum    O40.3XX1  condition or complication, fetus 1  Hypertension - Chronic/Pre-existing            O10.019  (Labetalol)  Low Risk NIPS, Neg Horizon & Neg AFP  [redacted] weeks gestation of pregnancy                Z3A.29 ---------------------------------------------------------------------- Fetal Evaluation  Num Of Fetuses:         1  Fetal Heart Rate(bpm):  154  Cardiac Activity:       Observed  Presentation:           Cephalic  Placenta:               Anterior  P. Cord Insertion:      Visualized  Amniotic Fluid  AFI FV:      Subjectively increased  AFI Sum(cm)     %Tile       Largest Pocket(cm)  25.68           > 97        8.49  RUQ(cm)       RLQ(cm)       LUQ(cm)        LLQ(cm)  6.63          3.77          6.79           8.49 ---------------------------------------------------------------------- Biometry  LV:        3.9  mm ---------------------------------------------------------------------- OB History  Gravidity:    2         Term:   0         SAB:   1  Living:       0 ---------------------------------------------------------------------- Gestational Age  LMP:           29w 0d        Date:  12/11/19                 EDD:   09/16/20  Best:          29w 0d     Det. By:  LMP  (12/11/19)          EDD:   09/16/20  ---------------------------------------------------------------------- Anatomy  Cranium:               Previously seen        Aortic Arch:            Previously seen  Cavum:                   Previously seen        Ductal Arch:            Previously seen  Ventricles:            Appears normal         Diaphragm:              Appears normal  Choroid Plexus:        Previously seen        Stomach:                Appears normal, left                                                                        sided  Cerebellum:            Previously seen        Abdomen:                Previously seen  Posterior Fossa:       Previously seen        Abdominal Wall:         Previously seen  Nuchal Fold:           Not applicable (>20    Cord Vessels:           Previously seen                         wks GA)  Face:                  Orbits and profile     Kidneys:                Previously seen                         previously seen  Lips:                  Previously seen        Bladder:                Appears normal  Thoracic:              Previously seen        Spine:                  Previously seen  Heart:                 Previously seen        Upper Extremities:      Previously seen  RVOT:                  Previously seen        Lower Extremities:      Previously seen  LVOT:                  Previously seen  Other:  Heels previously visualized. Nasal bone visualized. ---------------------------------------------------------------------- Comments  This patient was seen for fetal testing due to polyhydramnios  that was noted during her last ultrasound exam.  She reports  that she has screened negative for gestational diabetes and    reports feeling vigorous fetal movements throughout the day.  A biophysical profile performed today was 8 out of 8.  Polyhydramnios continues to be noted on today's ultrasound  exam.  She will return in 1 week for another biophysical profile.  ----------------------------------------------------------------------                   Victor Fang, MD Electronically Signed Final Report   07/01/2020 09:38 am ----------------------------------------------------------------------  US MFM OB FOLLOW UP  Result Date: 07/15/2020 ----------------------------------------------------------------------  OBSTETRICS REPORT                       (Signed Final 07/15/2020 04:03 pm) ---------------------------------------------------------------------- Patient Info  ID #:       [id number]                          D.O.B.:  01/14/1991 (28 yrs)  Name:       Jackie Newman                Visit Date: 07/15/2020 03:50 pm ---------------------------------------------------------------------- Performed By  Attending:        Ravi Shankar MD        Referred By:      [facility]  Performed By:     Jovancia Adrien        Location:         [facility] [facility]                    RDMS                                     [facility]                                                             [facility] ---------------------------------------------------------------------- Orders  #  Description                           Code        Ordered By  1  US MFM FETAL BPP WO NON               76819.01    YU FANG     STRESS  2  US MFM OB FOLLOW UP                   76816.01    YU FANG ----------------------------------------------------------------------  #  Order #                     Accession #                Episode #  1  [id number]                     [id number]                 [id number]  2  [id number]                   [id number]                 [id number] ---------------------------------------------------------------------- Indications  Encounter for other antenatal screening        Z36.2  follow-up  Polyhydramnios, third trimester, antepartum    O40.3XX1  condition or complication, fetus 1  Hypertension -  Chronic/Pre-existing            O10.019  (Labetalol)  Low Risk NIPS, Neg Horizon & Neg AFP  [redacted] weeks gestation of pregnancy                Z3A.31 ---------------------------------------------------------------------- Fetal Evaluation  Num Of Fetuses:         1  Fetal Heart Rate(bpm):  150  Cardiac Activity:       Observed  Presentation:           Cephalic  Placenta:               Anterior  P. Cord Insertion:      Visualized  Amniotic Fluid  AFI FV:      Within normal limits  AFI Sum(cm)     %Tile       Largest Pocket(cm)  23.74           95          9.01  RUQ(cm)       RLQ(cm)       LUQ(cm)        LLQ(cm)  9.01          3.65          8.21           2.87 ---------------------------------------------------------------------- Biophysical Evaluation  Amniotic F.V:   Pocket => 2 cm             F. Tone:        Observed  F. Movement:    Observed                   Score:          8/8  F. Breathing:   Observed ---------------------------------------------------------------------- Biometry  BPD:      84.1  mm     G. Age:  33w 6d         98  %    CI:        80.23   %    70 - 86                                                          FL/HC:      19.7   %    19.3 - 21.3  HC:      296.6  mm     G. Age:  32w 6d         64  %    HC/AC:      1.07        0.96 - 1.17  AC:      277.7  mm     G. Age:  31w 6d           71  %    FL/BPD:     69.4   %    71 - 87  FL:       58.4  mm     G. Age:  30w 4d         23  %    FL/AC:      21.0   %    20 - 24  HUM:        51  mm     G. Age:  29w 6d         28  %  LV:        4.4  mm  Est. FW:    1818  gm           4 lb     62  % ---------------------------------------------------------------------- OB History  Gravidity:    2         Term:   0         SAB:   1  Living:       0 ---------------------------------------------------------------------- Gestational Age  LMP:           31w 0d        Date:  12/11/19                 EDD:   09/16/20  U/S Today:     32w 2d                                         EDD:   09/07/20  Best:          31w 0d     Det. By:  LMP  (12/11/19)          EDD:   09/16/20 ---------------------------------------------------------------------- Anatomy  Cranium:               Appears normal         Aortic Arch:            Previously seen  Cavum:                 Appears normal         Ductal Arch:            Previously seen  Ventricles:            Appears normal         Diaphragm:              Appears normal  Choroid Plexus:        Previously seen        Stomach:                Appears normal, left                                                                        sided  Cerebellum:            Previously seen        Abdomen:                Appears normal  Posterior Fossa:         Previously seen        Abdominal Wall:         Previously seen  Nuchal Fold:           Not applicable (>20    Cord Vessels:           Previously seen                         wks GA)  Face:                  Orbits and profile     Kidneys:                Appear normal                         previously seen  Lips:                  Previously seen        Bladder:                Appears normal  Thoracic:              Appears normal         Spine:                  Previously seen  Heart:                 Previously seen        Upper Extremities:      Previously seen  RVOT:                  Previously seen        Lower Extremities:      Previously seen  LVOT:                  Previously seen ---------------------------------------------------------------------- Impression  Chronic hypertension.  Well controlled on labetalol.  Patient  takes labetalol 200 mg twice daily.  Blood pressure today at  our office is 120/74 mmHg.  Fetal growth is appropriate for gestational age .Amniotic fluid  is normal and good fetal activity is seen .Antenatal testing is  reassuring. BPP 8/8.  We reassured the patient of the findings. ---------------------------------------------------------------------- Recommendations  -Continue weekly BPP till  delivery. ----------------------------------------------------------------------                  Ravi Shankar, MD Electronically Signed Final Report   07/15/2020 04:03 pm ----------------------------------------------------------------------  US MFM OB LIMITED  Result Date: 07/01/2020 ----------------------------------------------------------------------  OBSTETRICS REPORT                       (Signed Final 07/01/2020 09:38 am) ---------------------------------------------------------------------- Patient Info  ID #:       [id number]                          D.O.B.:  12/12/1990 (28 yrs)  Name:       Jackie Newman                Visit Date: 07/01/2020 09:05 am ---------------------------------------------------------------------- Performed By  Attending:        Victor Fang MD         Referred By:      [facility]  Performed By:     Jovancia Adrien          [facility]                                     [facility]                                                             [facility] ---------------------------------------------------------------------- Orders  #  Description                           Code        Ordered By  1  US MFM OB LIMITED                     76815.01    YU FANG  2  US MFM FETAL BPP WO NON               76819.01    YU FANG     STRESS ----------------------------------------------------------------------  #  Order #                     Accession #                Episode #  1  [id number]                   [id number]                 [id number]  2  [id number]                   [id number]                 [id number] ---------------------------------------------------------------------- Indications  Polyhydramnios, third trimester, antepartum    O40.3XX1  condition or complication, fetus 1  Hypertension - Chronic/Pre-existing            O10.019  (Labetalol)  Low Risk NIPS, Neg  Horizon & Neg AFP  [redacted] weeks gestation of pregnancy                Z3A.29 ---------------------------------------------------------------------- Fetal Evaluation  Num Of Fetuses:         1  Fetal Heart Rate(bpm):  154  Cardiac Activity:       Observed  Presentation:           Cephalic  Placenta:               Anterior  P. Cord Insertion:      Visualized  Amniotic Fluid  AFI FV:      Subjectively increased  AFI Sum(cm)     %Tile       Largest Pocket(cm)  25.68           >   97        8.49  RUQ(cm)       RLQ(cm)       LUQ(cm)        LLQ(cm)  6.63          3.77          6.79           8.49 ---------------------------------------------------------------------- Biometry  LV:        3.9  mm ---------------------------------------------------------------------- OB History  Gravidity:    2         Term:   0         SAB:   1  Living:       0 ---------------------------------------------------------------------- Gestational Age  LMP:           29w 0d        Date:  12/11/19                 EDD:   09/16/20  Best:          29w 0d     Det. By:  LMP  (12/11/19)          EDD:   09/16/20 ---------------------------------------------------------------------- Anatomy  Cranium:               Previously seen        Aortic Arch:            Previously seen  Cavum:                 Previously seen        Ductal Arch:            Previously seen  Ventricles:            Appears normal         Diaphragm:              Appears normal  Choroid Plexus:        Previously seen        Stomach:                Appears normal, left                                                                        sided  Cerebellum:            Previously seen        Abdomen:                Previously seen  Posterior Fossa:       Previously seen        Abdominal Wall:         Previously seen  Nuchal Fold:           Not applicable (>20    Cord Vessels:           Previously seen                         wks GA)  Face:                  Orbits and profile     Kidneys:                   Previously seen                         previously seen  Lips:                  Previously seen        Bladder:                Appears normal  Thoracic:              Previously seen        Spine:                  Previously seen  Heart:                 Previously seen        Upper Extremities:      Previously seen  RVOT:                  Previously seen        Lower Extremities:      Previously seen  LVOT:                  Previously seen  Other:  Heels previously visualized. Nasal bone visualized. ---------------------------------------------------------------------- Comments  This patient was seen for fetal testing due to polyhydramnios  that was noted during her last ultrasound exam.  She reports  that she has screened negative for gestational diabetes and  reports feeling vigorous fetal movements throughout the day.  A biophysical profile performed today was 8 out of 8.  Polyhydramnios continues to be noted on today's ultrasound  exam.  She will return in 1 week for another biophysical profile. ----------------------------------------------------------------------                   Victor Fang, MD Electronically Signed Final Report   07/01/2020 09:38 am ----------------------------------------------------------------------  US ABDOMEN LIMITED RUQ  Result Date: 07/25/2020 CLINICAL DATA:  Upper abdominal pain, [redacted] weeks pregnant, elevated serum amylase EXAM: ULTRASOUND ABDOMEN LIMITED RIGHT UPPER QUADRANT COMPARISON:  None. FINDINGS: Gallbladder: No gallstones or wall thickening visualized. No sonographic Murphy sign noted by sonographer. Common bile duct: Diameter: 3 mm in mid diameter Liver: No focal lesion identified. Within normal limits in parenchymal echogenicity. Portal vein is patent on color Doppler imaging with normal direction of blood flow towards the liver. Other: No ascites identified within the right upper quadrant. IMPRESSION: Unremarkable right upper quadrant ultrasound as described.  Electronically Signed   By: Ashesh  Parikh MD   On: 07/25/2020 01:42    MAU Course/MDM: I have ordered labs and reviewed results. Amylase is slightly elevated as is WBC.  Lipase normal NST reviewed, initially tachycardic with one deep variable when patient vomiting.  As patient condition improved, FHR came down to a baseline of 140 with accels and good variability  Labs drawn initially and Covid test done IV started and bolus given Phenergan and zofran given for vomiting Levsin given which was vomited. RUQ US ordered when amylase result noted.  This was negative for cholelithiasis Preterm contractions were not abated by the fluid boluses, so FFn sent and Cervix checked and was closed.  FFn resulted as positive.  We gave Procardia for tocolysis. Consult Dr Pickens with presentation, exam findings and test results. He recommended Terbutaline instead of Procardia and Betamethasone.  Procardia converted contractions to shorter ones, less painful.    Patient refused Betamethasone due to being afraid of   the pain of the injection.  I reviewed risks of fetal lung immaturity and need for more support in NICU and possible deleterious effects on preterm baby. She again refused. Cervix rechecked and noted to be 1cm now, slightly shorter/soft.  (this was 2 hrs post 1st chk) Dr Pickens recommends Magnesium sulfate, admission and Betamethasone (refused again)  Assessment: Single IUP at [redacted]w[redacted]d Preterm Labor Nausea and vomiting Elevated Amylase with mild leukocytosis URI (covid negative)  Plan: Admit for observation Routine orders Magnesium Sulfate infusion for Tocolysis and neuroprotection US was done yesterday at [facility] and GBS done Labor team to follow. .  Taiyo Kozma CNM, MSN Certified Nurse-Midwife 07/25/2020 1:56 AM  

## 2020-07-25 NOTE — Progress Notes (Signed)
FACULTY PRACTICE ANTEPARTUM PROGRESS NOTE  Jackie Newman is a 29 y.o. G2P0010 at [redacted]w[redacted]d who is admitted for Preterm labor.  Estimated Date of Delivery: 09/16/20 Fetal presentation is cephalic.  Length of Stay:  0 Days. Admitted 07/24/2020  Subjective: Patient reports normal fetal movement.  She reports some contractions. Feeling better from nausea/vomiting standpoint, moreso her nasal congestion is bothering her now. Denies bleeding and leaking of fluid per vagina.  Vitals:  Blood pressure 119/74, pulse 90, temperature 98.1 F (36.7 C), temperature source Axillary, resp. rate 18, last menstrual period 12/11/2019, SpO2 100 %. Physical Examination: CONSTITUTIONAL: Well-developed, well-nourished female in no acute distress. Appears fatigued and congested HENT:  Normocephalic, atraumatic, External right and left ear normal. Oropharynx is clear and moist EYES: Conjunctivae and EOM are normal. Pupils are equal, round, and reactive to light. No scleral icterus.  NECK: Normal range of motion, supple, no masses. SKIN: Skin is warm and dry. No rash noted. Not diaphoretic. No erythema. No pallor. NEUROLGIC: Alert and oriented to person, place, and time. Normal reflexes, muscle tone coordination. No cranial nerve deficit noted. PSYCHIATRIC: Normal mood and affect. Normal behavior. Normal judgment and thought content. CARDIOVASCULAR: Normal heart rate noted RESPIRATORY: Effort normal, no problems with respiration noted MUSCULOSKELETAL: Normal range of motion. No edema and no tenderness. ABDOMEN: Soft, nontender, nondistended, gravid. CERVIX: examined by Dr. Mayford Knife, unchanged  Fetal monitoring: FHR: 140 bpm, Variability: moderate, Accelerations: Present, Decelerations: Absent  Uterine activity: no contractions per hour  Results for orders placed or performed during the hospital encounter of 07/24/20 (from the past 48 hour(s))  CBC with Differential/Platelet     Status: Abnormal   Collection Time:  07/24/20 10:04 PM  Result Value Ref Range   WBC 15.8 (H) 4.0 - 10.5 K/uL   RBC 4.41 3.87 - 5.11 MIL/uL   Hemoglobin 13.7 12.0 - 15.0 g/dL   HCT 90.2 36 - 46 %   MCV 91.2 80.0 - 100.0 fL   MCH 31.1 26.0 - 34.0 pg   MCHC 34.1 30.0 - 36.0 g/dL   RDW 40.9 73.5 - 32.9 %   Platelets 293 150 - 400 K/uL   nRBC 0.0 0.0 - 0.2 %   Neutrophils Relative % 79 %   Neutro Abs 12.5 (H) 1.7 - 7.7 K/uL   Lymphocytes Relative 11 %   Lymphs Abs 1.7 0.7 - 4.0 K/uL   Monocytes Relative 7 %   Monocytes Absolute 1.2 (H) 0 - 1 K/uL   Eosinophils Relative 0 %   Eosinophils Absolute 0.1 0 - 0 K/uL   Basophils Relative 1 %   Basophils Absolute 0.1 0 - 0 K/uL   Immature Granulocytes 2 %   Abs Immature Granulocytes 0.29 (H) 0.00 - 0.07 K/uL    Comment: Performed at [facility], [address] [place], [place] [address]  Comprehensive metabolic panel     Status: Abnormal   Collection Time: 07/24/20 10:04 PM  Result Value Ref Range   Sodium 135 135 - 145 mmol/L   Potassium 3.6 3.5 - 5.1 mmol/L   Chloride 103 98 - 111 mmol/L   CO2 17 (L) 22 - 32 mmol/L   Glucose, Bld 90 70 - 99 mg/dL    Comment: Glucose reference range applies only to samples taken after fasting for at least 8 hours.   BUN 5 (L) 6 - 20 mg/dL   Creatinine, Ser 8.34 0.44 - 1.00 mg/dL   Calcium 9.7 8.9 - 19.6 mg/dL   Total Protein  7.5 6.5 - 8.1 g/dL   Albumin 3.3 (L) 3.5 - 5.0 g/dL   AST 46 (H) 15 - 41 U/L   ALT 42 0 - 44 U/L   Alkaline Phosphatase 104 38 - 126 U/L   Total Bilirubin 0.8 0.3 - 1.2 mg/dL   GFR calc non Af Amer >60 >60 mL/min   GFR calc Af Amer >60 >60 mL/min   Anion gap 15 5 - 15    Comment: Performed at [facility], [address], [place], [place] [address]  Amylase     Status: Abnormal   Collection Time: 07/24/20 10:04 PM  Result Value Ref Range   Amylase 168 (H) 28 - 100 U/L    Comment: Performed at [facility], [address], [place], [place] [address]  Lipase, blood     Status: None    Collection Time: 07/24/20 10:04 PM  Result Value Ref Range   Lipase 25 11 - 51 U/L    Comment: Performed at [facility], [address], [place], [place] [address]  SARS Coronavirus 2 by RT PCR (hospital order, performed in [facility] hospital lab) Nasopharyngeal Nasopharyngeal Swab     Status: None   Collection Time: 07/24/20 10:05 PM   Specimen: Nasopharyngeal Swab  Result Value Ref Range   SARS Coronavirus 2 NEGATIVE NEGATIVE    Comment: (NOTE) SARS-CoV-2 target nucleic acids are NOT DETECTED.  The SARS-CoV-2 RNA is generally detectable in upper and lower respiratory specimens during the acute phase of infection. The lowest concentration of SARS-CoV-2 viral copies this assay can detect is 250 copies / mL. A negative result does not preclude SARS-CoV-2 infection and should not be used as the sole basis for treatment or other patient management decisions.  A negative result may occur with improper specimen collection / handling, submission of specimen other than nasopharyngeal swab, presence of viral mutation(s) within the areas targeted by this assay, and inadequate number of viral copies (<250 copies / mL). A negative result must be combined with clinical observations, patient history, and epidemiological information.  Fact Sheet for Patients:   BoilerBrush.com.cy  Fact Sheet for Healthcare Providers: https://pope.com/  This test is not yet approved or  cleared by the [place] FDA and has been authorized for detection and/or diagnosis of SARS-CoV-2 by FDA under an Emergency Use Authorization (EUA).  This EUA will remain in effect (meaning this test can be used) for the duration of the COVID-19 declaration under Section 564(b)(1) of the Act, 21 U.S.C. section 360bbb-3(b)(1), unless the authorization is terminated or revoked sooner.  Performed at [facility], [address] [place], [place] [address]   Type  and screen [facility]     Status: None   Collection Time: 07/24/20 10:05 PM  Result Value Ref Range   ABO/RH(D) O POS    Antibody Screen NEG    Sample Expiration      07/27/2020,2359 Performed at [facility], [address], [place], [place] [address]   Fetal fibronectin     Status: Abnormal   Collection Time: 07/25/20 12:54 AM  Result Value Ref Range   Fetal Fibronectin POSITIVE (A) NEGATIVE    Comment: Performed at [facility], [address] [place], [place] [address]  GC/Chlamydia probe amp ([place])not at [facility]     Status: None   Collection Time: 07/25/20  3:15 AM  Result Value Ref Range   Neisseria Gonorrhea Negative  Chlamydia Negative    Comment Normal Reference Ranger Chlamydia - Negative    Comment      Normal Reference Range Neisseria Gonorrhea - Negative    I have reviewed the patient's current medications.  ASSESSMENT: Active Problems:   Preterm labor   Elevated amylase   Elevated AST (SGOT)   Polyhydramnios affecting pregnancy   PLAN:  Pre-term labor - will dc mag now - monitor contractions - reviewed recommendation for antenatal corticosteroids, importance of decreasing risk of fetal lung maturity, pt declines, states she needs to "build up her courage"  cHTN - cont home labetalol - cont baby asa  URI - prn meds - respiratory panel   Continue routine antenatal care.   Baldemar Lenis, M.D. Attending [facility] [employer] (Faculty Practice)  07/25/2020 11:47 AM

## 2020-07-25 NOTE — H&P (Signed)
Chief Complaint:  Abdominal Pain, Nausea, and Emesis   First Provider Initiated Contact with Patient 07/24/20 2218     HPI: Jackie Newman is a 29 y.o. G2P0010 at 29w3dwho presents via EMS to maternity admissions reporting nausea, vomiting, abdominal pain, loose stools, and nasal congestion. 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  On admission she it retching and uncomfortable.  . She reports good fetal movement, denies LOF, vaginal bleeding, vaginal itching/burning, urinary symptoms, h/a, dizziness, constipation or fever/chills.  She denies headache, visual changes or RUQ abdominal pain.  Since she had new symptoms and significant nasal congestion, we initiated precautions and tested for Covid.  Abdominal Pain This is a recurrent problem. The current episode started today. The problem occurs intermittently. The problem has been unchanged. The pain is located in the generalized abdominal region. The pain is moderate. The quality of the pain is cramping. The abdominal pain does not radiate. Associated symptoms include diarrhea, nausea and vomiting. Pertinent negatives include no constipation, dysuria, fever, frequency, headaches or myalgias. Nothing aggravates the pain. The pain is relieved by nothing. She has tried nothing for the symptoms.  Emesis  This is a recurrent problem. The current episode started today. The problem occurs intermittently. The problem has been unchanged. There has been no fever. Associated symptoms include abdominal pain, diarrhea and URI. Pertinent negatives include no chills, fever, headaches or myalgias. She has tried nothing for the symptoms.    RN Note: Jackie Newman is a 29 y.o. at [redacted]w[redacted]d here in [facility]U reporting: abdominal pain, vomiting, and loose stool, unable to urinate  Onset of complaint: yesterday Pain score: 10  Past Medical History: Past Medical History:  Diagnosis Date  . Asthma   . Congenital umbilical hernia   .  Hypertension   . Ovarian cyst   . Seizures (HCC)    No follow up. Patient stated she had one seizure during her 23st birthday drinking alcohol.    Past obstetric history: OB History  Gravida Para Term Preterm AB Living  2       1 0  SAB TAB Ectopic Multiple Live Births  1            # Outcome Date GA Lbr Len/2nd Weight Sex Delivery Anes PTL Lv  2 Current           1 SAB 2015            Past Surgical History: Past Surgical History:  Procedure Laterality Date  . COLONOSCOPY WITH ESOPHAGOGASTRODUODENOSCOPY (EGD)  12/04/2019    Family History: Family History  Problem Relation Age of Onset  . Asthma Father   . Gout Father     Social History: Social History   Tobacco Use  . Smoking status: Passive Smoke Exposure - Never Smoker  . Smokeless tobacco: Never Used  Vaping Use  . Vaping Use: Never used  Substance Use Topics  . Alcohol use: No    Comment: occasional socially  . Drug use: Not Currently    Types: Marijuana    Comment: quit 3-4 weeks ago    Allergies:  Allergies  Allergen Reactions  . Doxycycline Nausea And Vomiting    Meds:  Medications Prior to Admission  Medication Sig Dispense Refill Last Dose  . aspirin 81 MG chewable tablet Chew 1 tablet (81 mg total) by mouth daily. 30 tablet 6 07/23/2020 at Unknown time  . labetalol (NORMODYNE) 200 MG tablet Take 1 tablet (200 mg total) by mouth 2 (two)  times daily. 60 tablet 3 07/23/2020 at Unknown time  . promethazine (PHENERGAN) 25 MG tablet Take 0.5-1 tablets (12.5-25 mg total) by mouth every 6 (six) hours as needed for nausea or vomiting. 30 tablet 0 07/23/2020 at Unknown time  . albuterol (VENTOLIN HFA) 108 (90 Base) MCG/ACT inhaler Inhale 1-2 puffs into the lungs every 6 (six) hours as needed for wheezing or shortness of breath. 18 g 3   . Blood Pressure Monitoring (BLOOD PRESSURE MONITOR AUTOMAT) DEVI 1 Device by Does not apply route daily. Automatic blood pressure cuff regular size. To monitor blood pressure  regularly at home. ICD-10 code: O68.90 (Patient not taking: Reported on 07/11/2020) 1 each 0   . butalbital-acetaminophen-caffeine (FIORICET) 50-325-40 MG tablet Take 1-2 tablets by mouth every 6 (six) hours as needed for headache. (Patient not taking: Reported on 05/30/2020) 6 tablet 0   . Elastic Bandages & Supports (WRIST SPLINT/COCK-UP/LEFT L) MISC 1 Device by Does not apply route 3 times/day as needed-between meals & bedtime. 1 each 0   . Elastic Bandages & Supports (WRIST SPLINT/COCK-UP/RIGHT L) MISC 1 Device by Does not apply route as needed. 1 each 0   . Misc. Devices (GOJJI WEIGHT SCALE) MISC 1 Device by Does not apply route daily as needed. To weight self daily as needed at home. ICD-10 code: O09.90 1 each 0   . terconazole (TERAZOL 7) 0.4 % vaginal cream Place 1 applicator vaginally at bedtime. (Patient not taking: Reported on 07/11/2020) 45 g 0     I have reviewed patient's Past Medical Hx, Surgical Hx, Family Hx, Social Hx, medications and allergies.   ROS:  Review of Systems  Constitutional: Negative for chills and fever.  Gastrointestinal: Positive for abdominal pain, diarrhea, nausea and vomiting. Negative for constipation.  Genitourinary: Negative for dysuria and frequency.  Musculoskeletal: Negative for myalgias.  Neurological: Negative for headaches.   Other systems negative  Physical Exam   Patient Vitals for the past 24 hrs:  BP Temp Temp src Pulse Resp SpO2  07/25/20 0145 130/70 -- -- -- -- --  07/24/20 2230 125/77 -- -- (!) 121 -- --  07/24/20 2200 135/74 98.8 F (37.1 C) Oral (!) 131 20 98 %   Vitals:   07/24/20 2200 07/24/20 2230 07/25/20 0145  BP: 135/74 125/77 130/70  Pulse: (!) 131 (!) 121   Resp: 20    Temp: 98.8 F (37.1 C)    TempSrc: Oral    SpO2: 98%      Constitutional: Well-developed, well-nourished female in no acute distress, but retching and uncomfortable with abdominal cramping.   Significant nasal congestion.  .  Cardiovascular: normal rate  and rhythm Respiratory: normal effort, clear to auscultation bilaterally GI: Abd soft, non-tender, gravid appropriate for gestational age.   No rebound or guarding. MS: Extremities nontender, no edema, normal ROM Neurologic: Alert and oriented x 4.  GU: Neg CVAT.  PELVIC EXAM:  Dilation: Closed Effacement (%): Thick Cervical Position: Posterior Station: Ballotable Exam by:: Wynelle Bourgeois, CNM  Dilation: 1 Effacement (%): 10 Cervical Position: Posterior Station: Ballotable Exam by:: [employer]  FHT:  Baseline 180 (while pt vomiting) , moderate variability, accelerations present, no decelerations Contractions:  Irregular Difficult to trace due to pt vomiting   Labs: Results for orders placed or performed during the hospital encounter of 07/24/20 (from the past 24 hour(s))  CBC with Differential/Platelet     Status: Abnormal   Collection Time: 07/24/20 10:04 PM  Result Value Ref Range   WBC 15.8 (  H) 4.0 - 10.5 K/uL   RBC 4.41 3.87 - 5.11 MIL/uL   Hemoglobin 13.7 12.0 - 15.0 g/dL   HCT 16.1 36 - 46 %   MCV 91.2 80.0 - 100.0 fL   MCH 31.1 26.0 - 34.0 pg   MCHC 34.1 30.0 - 36.0 g/dL   RDW 09.6 04.5 - 40.9 %   Platelets 293 150 - 400 K/uL   nRBC 0.0 0.0 - 0.2 %   Neutrophils Relative % 79 %   Neutro Abs 12.5 (H) 1.7 - 7.7 K/uL   Lymphocytes Relative 11 %   Lymphs Abs 1.7 0.7 - 4.0 K/uL   Monocytes Relative 7 %   Monocytes Absolute 1.2 (H) 0 - 1 K/uL   Eosinophils Relative 0 %   Eosinophils Absolute 0.1 0 - 0 K/uL   Basophils Relative 1 %   Basophils Absolute 0.1 0 - 0 K/uL   Immature Granulocytes 2 %   Abs Immature Granulocytes 0.29 (H) 0.00 - 0.07 K/uL  Comprehensive metabolic panel     Status: Abnormal   Collection Time: 07/24/20 10:04 PM  Result Value Ref Range   Sodium 135 135 - 145 mmol/L   Potassium 3.6 3.5 - 5.1 mmol/L   Chloride 103 98 - 111 mmol/L   CO2 17 (L) 22 - 32 mmol/L   Glucose, Bld 90 70 - 99 mg/dL   BUN 5 (L) 6 - 20 mg/dL   Creatinine, Ser 8.11  0.44 - 1.00 mg/dL   Calcium 9.7 8.9 - 91.4 mg/dL   Total Protein 7.5 6.5 - 8.1 g/dL   Albumin 3.3 (L) 3.5 - 5.0 g/dL   AST 46 (H) 15 - 41 U/L   ALT 42 0 - 44 U/L   Alkaline Phosphatase 104 38 - 126 U/L   Total Bilirubin 0.8 0.3 - 1.2 mg/dL   GFR calc non Af Amer >60 >60 mL/min   GFR calc Af Amer >60 >60 mL/min   Anion gap 15 5 - 15  Amylase     Status: Abnormal   Collection Time: 07/24/20 10:04 PM  Result Value Ref Range   Amylase 168 (H) 28 - 100 U/L  Lipase, blood     Status: None   Collection Time: 07/24/20 10:04 PM  Result Value Ref Range   Lipase 25 11 - 51 U/L  SARS Coronavirus 2 by RT PCR (hospital order, performed in [facility] hospital lab) Nasopharyngeal Nasopharyngeal Swab     Status: None   Collection Time: 07/24/20 10:05 PM   Specimen: Nasopharyngeal Swab  Result Value Ref Range   SARS Coronavirus 2 NEGATIVE NEGATIVE  Fetal fibronectin     Status: Abnormal   Collection Time: 07/25/20 12:54 AM  Result Value Ref Range   Fetal Fibronectin POSITIVE (A) NEGATIVE   O/Positive/-- (04/07 1541)  Imaging:  [place] MFM FETAL BPP WO NON STRESS  Result Date: 07/24/2020 ----------------------------------------------------------------------  OBSTETRICS REPORT                       (Signed Final 07/24/2020 02:39 pm) ---------------------------------------------------------------------- Patient Info  ID #:       [id number]                          D.O.B.:  08/02/1991 (28 yrs)  Name:       [name]                Visit Date: 07/24/2020 09:22 am ----------------------------------------------------------------------  Performed By  Attending:        Lin Landsman      Referred By:      Mayra Neer                    MD  Performed By:     Lenise Arena        [facility]                    RD[facility]                                     [facility]                                                             [facility] ---------------------------------------------------------------------- Orders  #  Description                           Code        Ordered By  1  [place] MFM FETAL BPP WO NON               76819.01    RAVI [facility]     STRESS ----------------------------------------------------------------------  #  Order #                     Accession #                Episode #  1  [id number]                   [id number]                 [id number] ---------------------------------------------------------------------- Indications  Polyhydramnios, third trimester, antepartum    O40.3XX1  condition or complication, fetus 1  Hypertension - Chronic/Pre-existing            O10.019  (Labetalol)  Low Risk NIPS, Neg Horizon & Neg AFP  [redacted] weeks gestation of pregnancy                Z3A.32 ---------------------------------------------------------------------- Fetal Evaluation  Num Of Fetuses:         1  Fetal Heart Rate(bpm):  131  Cardiac Activity:       Observed  Presentation:           Cephalic  Placenta:               Anterior  P. Cord Insertion:      Previously Visualized  Amniotic Fluid  AFI FV:      Polyhydramnios  AFI Sum(cm)     %Tile       Largest Pocket(cm)  25.36           96  8.8  RUQ(cm)       RLQ(cm)       LUQ(cm)        LLQ(cm)  4.16          7.9           4.5            8.8 ---------------------------------------------------------------------- Biophysical Evaluation  Amniotic F.V:   Within normal limits       F. Tone:        Observed  F. Movement:    Observed                   Score:          8/8  F. Breathing:   Observed ---------------------------------------------------------------------- OB History  Gravidity:    2         Term:   0         SAB:   1  Living:       0 ---------------------------------------------------------------------- Gestational Age  LMP:           32w 2d        Date:  12/11/19                 EDD:   09/16/20  Best:          Armida Sans 2d     Det. By:  LMP  (12/11/19)          EDD:    09/16/20 ---------------------------------------------------------------------- Anatomy  Thoracic:              Appears normal         Bladder:                Appears normal  Stomach:               Appears normal, left                         sided ---------------------------------------------------------------------- Cervix Uterus Adnexa  Cervix  Not visualized (advanced GA >24wks) ---------------------------------------------------------------------- Impression  Antenatal testing due to polyhydramnios  Chronic hypertension on antihypertensive therapy.  Biophsical profile 8/8 with good fetal movement and elevated  amniotic fluid volume. ---------------------------------------------------------------------- Recommendations  Continue weekly testing. ----------------------------------------------------------------------               Lin Landsman, MD Electronically Signed Final Report   07/24/2020 02:39 pm ----------------------------------------------------------------------  [place] MFM FETAL BPP WO NON STRESS  Result Date: 07/15/2020 ----------------------------------------------------------------------  OBSTETRICS REPORT                       (Signed Final 07/15/2020 04:03 pm) ---------------------------------------------------------------------- Patient Info  ID #:       [id number]                          D.O.B.:  [birth year] (28 yrs)  Name:       [name]                Visit Date: 07/15/2020 03:50 pm ---------------------------------------------------------------------- Performed By  Attending:        Noralee Space MD        Referred By:      [facility]  Performed By:     Sandi Mealy        Location[facility]  RDMS                                     [facility]  ---------------------------------------------------------------------- Orders  #  Description                           Code        Ordered By  1  [place] MFM FETAL BPP WO NON               76819.01    YU FANG     STRESS  2  [place] MFM OB FOLLOW UP                   76816.01    YU FANG ----------------------------------------------------------------------  #  Order #                     Accession #                Episode #  1  [id number]                   [id number]                 [id number]  2  [id number]                   [id number]                 [id number] ---------------------------------------------------------------------- Indications  Encounter for other antenatal screening        Z36.2  follow-up  Polyhydramnios, third trimester, antepartum    O40.3XX1  condition or complication, fetus 1  Hypertension - Chronic/Pre-existing            O10.019  (Labetalol)  Low Risk NIPS, Neg Horizon & Neg AFP  [redacted] weeks gestation of pregnancy                Z3A.31 ---------------------------------------------------------------------- Fetal Evaluation  Num Of Fetuses:         1  Fetal Heart Rate(bpm):  150  Cardiac Activity:       Observed  Presentation:           Cephalic  Placenta:               Anterior  P. Cord Insertion:      Visualized  Amniotic Fluid  AFI FV:      Within normal limits  AFI Sum(cm)     %Tile       Largest Pocket(cm)  23.74           95  9.01  RUQ(cm)       RLQ(cm)       LUQ(cm)        LLQ(cm)  9.01          3.65          8.21           2.87 ---------------------------------------------------------------------- Biophysical Evaluation  Amniotic F.V:   Pocket => 2 cm             F. Tone:        Observed  F. Movement:    Observed                   Score:          8/8  F. Breathing:   Observed ---------------------------------------------------------------------- Biometry  BPD:      84.1  mm     G. Age:  33w 6d         98  %    CI:        80.23   %    70 - 86                                                           FL/HC:      19.7   %    19.3 - 21.3  HC:      296.6  mm     G. Age:  32w 6d         64  %    HC/AC:      1.07        0.96 - 1.17  AC:      277.7  mm     G. Age:  31w 6d         71  %    FL/BPD:     69.4   %    71 - 87  FL:       58.4  mm     G. Age:  30w 4d         23  %    FL/AC:      21.0   %    20 - 24  HUM:        51  mm     G. Age:  29w 6d         28  %  LV:        4.4  mm  Est. FW:    1818  gm           4 lb     62  % ---------------------------------------------------------------------- OB History  Gravidity:    2         Term:   0         SAB:   1  Living:       0 ---------------------------------------------------------------------- Gestational Age  LMP:           31w 0d        Date:  12/11/19                 EDD:   09/16/20  U/S Today:     32w 2d  EDD:   09/07/20  Best:          Bobbye Riggs 0d     Det. By:  LMP  (12/11/19)          EDD:   09/16/20 ---------------------------------------------------------------------- Anatomy  Cranium:               Appears normal         Aortic Arch:            Previously seen  Cavum:                 Appears normal         Ductal Arch:            Previously seen  Ventricles:            Appears normal         Diaphragm:              Appears normal  Choroid Plexus:        Previously seen        Stomach:                Appears normal, left                                                                        sided  Cerebellum:            Previously seen        Abdomen:                Appears normal  Posterior Fossa:       Previously seen        Abdominal Wall:         Previously seen  Nuchal Fold:           Not applicable (>20    Cord Vessels:           Previously seen                         wks GA)  Face:                  Orbits and profile     Kidneys:                Appear normal                         previously seen  Lips:                  Previously seen        Bladder:                Appears normal  Thoracic:              Appears  normal         Spine:                  Previously seen  Heart:                 Previously seen        Upper Extremities:  Previously seen  RVOT:                  Previously seen        Lower Extremities:      Previously seen  LVOT:                  Previously seen ---------------------------------------------------------------------- Impression  Chronic hypertension.  Well controlled on labetalol.  Patient  takes labetalol 200 mg twice daily.  Blood pressure today at  our office is 120/74 mmHg.  Fetal growth is appropriate for gestational age .Amniotic fluid  is normal and good fetal activity is seen .Antenatal testing is  reassuring. BPP 8/8.  We reassured the patient of the findings. ---------------------------------------------------------------------- Recommendations  -Continue weekly BPP till delivery. ----------------------------------------------------------------------                  Noralee Space, MD Electronically Signed Final Report   07/15/2020 04:03 pm ----------------------------------------------------------------------  [place] MFM FETAL BPP WO NON STRESS  Result Date: 07/01/2020 ----------------------------------------------------------------------  OBSTETRICS REPORT                       (Signed Final 07/01/2020 09:38 am) ---------------------------------------------------------------------- Patient Info  ID #:       [id number]                          D.O.B.:  1991/02/16 (28 yrs)  Name:       [name]                Visit Date: 07/01/2020 09:05 am ---------------------------------------------------------------------- Performed By  Attending:        Ma Rings MD         Referred By:      [facility]  Performed By:     Sandi Mealy        Location[facility]                    RDMS                                     [facility] ---------------------------------------------------------------------- Orders  #  Description                           Code        Ordered By  1  [place] MFM OB LIMITED                     76815.01    YU FANG  2  [place] MFM FETAL BPP WO NON               [id number]    YU FANG     STRESS ----------------------------------------------------------------------  #  Order #                     Accession #                Episode #  1  [id number]                   [id number]                 [id number]  2  [id number]                   [id number]                 [id number] ---------------------------------------------------------------------- Indications  Polyhydramnios, third trimester, antepartum    O40.3XX1  condition or complication, fetus 1  Hypertension - Chronic/Pre-existing            O10.019  (Labetalol)  Low Risk NIPS, Neg Horizon & Neg AFP  [redacted] weeks gestation of pregnancy                Z3A.29 ---------------------------------------------------------------------- Fetal Evaluation  Num Of Fetuses:         1  Fetal Heart Rate(bpm):  154  Cardiac Activity:       Observed  Presentation:           Cephalic  Placenta:               Anterior  P. Cord Insertion:      Visualized  Amniotic Fluid  AFI FV:      Subjectively increased  AFI Sum(cm)     %Tile       Largest Pocket(cm)  25.68           > 97        8.49  RUQ(cm)       RLQ(cm)       LUQ(cm)        LLQ(cm)  6.63          3.77          6.79           8.49 ---------------------------------------------------------------------- Biometry  LV:        3.9  mm ---------------------------------------------------------------------- OB History  Gravidity:    2         Term:   0         SAB:   1  Living:       0 ---------------------------------------------------------------------- Gestational Age  LMP:           29w 0d        Date:  12/11/19                 EDD:   09/16/20  Best:          29w 0d     Det. By:  LMP  (12/11/19)          EDD:   09/16/20  ---------------------------------------------------------------------- Anatomy  Cranium:               Previously seen        Aortic Arch:            Previously seen  Cavum:  Previously seen        Ductal Arch:            Previously seen  Ventricles:            Appears normal         Diaphragm:              Appears normal  Choroid Plexus:        Previously seen        Stomach:                Appears normal, left                                                                        sided  Cerebellum:            Previously seen        Abdomen:                Previously seen  Posterior Fossa:       Previously seen        Abdominal Wall:         Previously seen  Nuchal Fold:           Not applicable (>20    Cord Vessels:           Previously seen                         wks GA)  Face:                  Orbits and profile     Kidneys:                Previously seen                         previously seen  Lips:                  Previously seen        Bladder:                Appears normal  Thoracic:              Previously seen        Spine:                  Previously seen  Heart:                 Previously seen        Upper Extremities:      Previously seen  RVOT:                  Previously seen        Lower Extremities:      Previously seen  LVOT:                  Previously seen  Other:  Heels previously visualized. Nasal bone visualized. ---------------------------------------------------------------------- Comments  This patient was seen for fetal testing due to polyhydramnios  that was noted during her last ultrasound exam.  She reports  that she has screened negative for gestational diabetes and  reports feeling vigorous fetal movements throughout the day.  A biophysical profile performed today was 8 out of 8.  Polyhydramnios continues to be noted on today's ultrasound  exam.  She will return in 1 week for another biophysical profile.  ----------------------------------------------------------------------                   Ma Rings, MD Electronically Signed Final Report   07/01/2020 09:38 am ----------------------------------------------------------------------  [place] MFM OB FOLLOW UP  Result Date: 07/15/2020 ----------------------------------------------------------------------  OBSTETRICS REPORT                       (Signed Final 07/15/2020 04:03 pm) ---------------------------------------------------------------------- Patient Info  ID #:       [id number]                          D.O.B.:  03/06/1991 (28 yrs)  Name:       [name]                Visit Date: 07/15/2020 03:50 pm ---------------------------------------------------------------------- Performed By  Attending:        Noralee Space MD        Referred By:      [facility]  Performed By:     Sandi Mealy        [facility]                                     [facility] ---------------------------------------------------------------------- Orders  #  Description                           Code        Ordered By  1  [place] MFM FETAL BPP WO NON               76819.01    YU FANG     STRESS  2  [place] MFM OB FOLLOW UP                   40981.19    YU FANG ----------------------------------------------------------------------  #  Order #                     Accession #                Episode #  1  [id number]  [id number]                 [id number]  2  [id number]                   [id number]                 [id number] ---------------------------------------------------------------------- Indications  Encounter for other antenatal screening        Z36.2  follow-up  Polyhydramnios, third trimester, antepartum    O40.3XX1  condition or complication, fetus 1  Hypertension -  Chronic/Pre-existing            O10.019  (Labetalol)  Low Risk NIPS, Neg Horizon & Neg AFP  [redacted] weeks gestation of pregnancy                Z3A.31 ---------------------------------------------------------------------- Fetal Evaluation  Num Of Fetuses:         1  Fetal Heart Rate(bpm):  150  Cardiac Activity:       Observed  Presentation:           Cephalic  Placenta:               Anterior  P. Cord Insertion:      Visualized  Amniotic Fluid  AFI FV:      Within normal limits  AFI Sum(cm)     %Tile       Largest Pocket(cm)  23.74           95          9.01  RUQ(cm)       RLQ(cm)       LUQ(cm)        LLQ(cm)  9.01          3.65          8.21           2.87 ---------------------------------------------------------------------- Biophysical Evaluation  Amniotic F.V:   Pocket => 2 cm             F. Tone:        Observed  F. Movement:    Observed                   Score:          8/8  F. Breathing:   Observed ---------------------------------------------------------------------- Biometry  BPD:      84.1  mm     G. Age:  33w 6d         98  %    CI:        80.23   %    70 - 86                                                          FL/HC:      19.7   %    19.3 - 21.3  HC:      296.6  mm     G. Age:  32w 6d         64  %    HC/AC:      1.07        0.96 - 1.17  AC:      277.7  mm     G. Age:  31w 6d  71  %    FL/BPD:     69.4   %    71 - 87  FL:       58.4  mm     G. Age:  30w 4d         23  %    FL/AC:      21.0   %    20 - 24  HUM:        51  mm     G. Age:  29w 6d         28  %  LV:        4.4  mm  Est. FW:    1818  gm           4 lb     62  % ---------------------------------------------------------------------- OB History  Gravidity:    2         Term:   0         SAB:   1  Living:       0 ---------------------------------------------------------------------- Gestational Age  LMP:           31w 0d        Date:  12/11/19                 EDD:   09/16/20  U/S Today:     32w 2d                                         EDD:   09/07/20  Best:          31w 0d     Det. By:  LMP  (12/11/19)          EDD:   09/16/20 ---------------------------------------------------------------------- Anatomy  Cranium:               Appears normal         Aortic Arch:            Previously seen  Cavum:                 Appears normal         Ductal Arch:            Previously seen  Ventricles:            Appears normal         Diaphragm:              Appears normal  Choroid Plexus:        Previously seen        Stomach:                Appears normal, left                                                                        sided  Cerebellum:            Previously seen        Abdomen:                Appears normal  Posterior Fossa:  Previously seen        Abdominal Wall:         Previously seen  Nuchal Fold:           Not applicable (>20    Cord Vessels:           Previously seen                         wks GA)  Face:                  Orbits and profile     Kidneys:                Appear normal                         previously seen  Lips:                  Previously seen        Bladder:                Appears normal  Thoracic:              Appears normal         Spine:                  Previously seen  Heart:                 Previously seen        Upper Extremities:      Previously seen  RVOT:                  Previously seen        Lower Extremities:      Previously seen  LVOT:                  Previously seen ---------------------------------------------------------------------- Impression  Chronic hypertension.  Well controlled on labetalol.  Patient  takes labetalol 200 mg twice daily.  Blood pressure today at  our office is 120/74 mmHg.  Fetal growth is appropriate for gestational age .Amniotic fluid  is normal and good fetal activity is seen .Antenatal testing is  reassuring. BPP 8/8.  We reassured the patient of the findings. ---------------------------------------------------------------------- Recommendations  -Continue weekly BPP till  delivery. ----------------------------------------------------------------------                  Noralee Space, MD Electronically Signed Final Report   07/15/2020 04:03 pm ----------------------------------------------------------------------  [place] MFM OB LIMITED  Result Date: 07/01/2020 ----------------------------------------------------------------------  OBSTETRICS REPORT                       (Signed Final 07/01/2020 09:38 am) ---------------------------------------------------------------------- Patient Info  ID #:       [id number]                          D.O.B.:  01/15/1991 (28 yrs)  Name:       [name]                Visit Date: 07/01/2020 09:05 am ---------------------------------------------------------------------- Performed By  Attending:        Ma Rings MD         Referred By:      [facility]  Performed By:     Sandi Mealy  Location[facility]                                     [facility] ---------------------------------------------------------------------- Orders  #  Description                           Code        Ordered By  1  [place] MFM OB LIMITED                     76815.01    YU FANG  2  [place] MFM FETAL BPP WO NON               76819.01    YU FANG     STRESS ----------------------------------------------------------------------  #  Order #                     Accession #                Episode #  1  [id number]                   [id number]                 [id number]  2  [id number]                   [id number]                 [id number] ---------------------------------------------------------------------- Indications  Polyhydramnios, third trimester, antepartum    O40.3XX1  condition or complication, fetus 1  Hypertension - Chronic/Pre-existing            O10.019  (Labetalol)  Low Risk NIPS, Neg  Horizon & Neg AFP  [redacted] weeks gestation of pregnancy                Z3A.29 ---------------------------------------------------------------------- Fetal Evaluation  Num Of Fetuses:         1  Fetal Heart Rate(bpm):  154  Cardiac Activity:       Observed  Presentation:           Cephalic  Placenta:               Anterior  P. Cord Insertion:      Visualized  Amniotic Fluid  AFI FV:      Subjectively increased  AFI Sum(cm)     %Tile       Largest Pocket(cm)  25.68           >  97        8.49  RUQ(cm)       RLQ(cm)       LUQ(cm)        LLQ(cm)  6.63          3.77          6.79           8.49 ---------------------------------------------------------------------- Biometry  LV:        3.9  mm ---------------------------------------------------------------------- OB History  Gravidity:    2         Term:   0         SAB:   1  Living:       0 ---------------------------------------------------------------------- Gestational Age  LMP:           29w 0d        Date:  12/11/19                 EDD:   09/16/20  Best:          29w 0d     Det. By:  LMP  (12/11/19)          EDD:   09/16/20 ---------------------------------------------------------------------- Anatomy  Cranium:               Previously seen        Aortic Arch:            Previously seen  Cavum:                 Previously seen        Ductal Arch:            Previously seen  Ventricles:            Appears normal         Diaphragm:              Appears normal  Choroid Plexus:        Previously seen        Stomach:                Appears normal, left                                                                        sided  Cerebellum:            Previously seen        Abdomen:                Previously seen  Posterior Fossa:       Previously seen        Abdominal Wall:         Previously seen  Nuchal Fold:           Not applicable (>20    Cord Vessels:           Previously seen                         wks GA)  Face:                  Orbits and profile     Kidneys:  Previously seen                         previously seen  Lips:                  Previously seen        Bladder:                Appears normal  Thoracic:              Previously seen        Spine:                  Previously seen  Heart:                 Previously seen        Upper Extremities:      Previously seen  RVOT:                  Previously seen        Lower Extremities:      Previously seen  LVOT:                  Previously seen  Other:  Heels previously visualized. Nasal bone visualized. ---------------------------------------------------------------------- Comments  This patient was seen for fetal testing due to polyhydramnios  that was noted during her last ultrasound exam.  She reports  that she has screened negative for gestational diabetes and  reports feeling vigorous fetal movements throughout the day.  A biophysical profile performed today was 8 out of 8.  Polyhydramnios continues to be noted on today's ultrasound  exam.  She will return in 1 week for another biophysical profile. ----------------------------------------------------------------------                   Ma Rings, MD Electronically Signed Final Report   07/01/2020 09:38 am ----------------------------------------------------------------------  US ABDOMEN LIMITED RUQ  Result Date: 07/25/2020 CLINICAL DATA:  Upper abdominal pain, [redacted] weeks pregnant, elevated serum amylase EXAM: ULTRASOUND ABDOMEN LIMITED RIGHT UPPER QUADRANT COMPARISON:  None. FINDINGS: Gallbladder: No gallstones or wall thickening visualized. No sonographic Murphy sign noted by sonographer. Common bile duct: Diameter: 3 mm in mid diameter Liver: No focal lesion identified. Within normal limits in parenchymal echogenicity. Portal vein is patent on color Doppler imaging with normal direction of blood flow towards the liver. Other: No ascites identified within the right upper quadrant. IMPRESSION: Unremarkable right upper quadrant ultrasound as described.  Electronically Signed   By: Helyn Numbers MD   On: 07/25/2020 01:42    MAU Course/MDM: I have ordered labs and reviewed results. Amylase is slightly elevated as is WBC.  Lipase normal NST reviewed, initially tachycardic with one deep variable when patient vomiting.  As patient condition improved, FHR came down to a baseline of 140 with accels and good variability  Labs drawn initially and Covid test done IV started and bolus given Phenergan and zofran given for vomiting Levsin given which was vomited. RUQ [place] ordered when amylase result noted.  This was negative for cholelithiasis Preterm contractions were not abated by the fluid boluses, so FFn sent and Cervix checked and was closed.  FFn resulted as positive.  We gave Procardia for tocolysis. Consult Dr Vergie Living with presentation, exam findings and test results. He recommended Terbutaline instead of Procardia and Betamethasone.  Procardia converted contractions to shorter ones, less painful.    Patient refused Betamethasone due to being afraid of  the pain of the injection.  I reviewed risks of fetal lung immaturity and need for more support in NICU and possible deleterious effects on preterm baby. She again refused. Cervix rechecked and noted to be 1cm now, slightly shorter/soft.  (this was 2 hrs post 1st chk) Dr Vergie Living recommends Magnesium sulfate, admission and Betamethasone (refused again)  Assessment: Single IUP at [redacted]w[redacted]d Preterm Labor Nausea and vomiting Elevated Amylase with mild leukocytosis URI (covid negative)  Plan: Admit for observation Routine orders Magnesium Sulfate infusion for Tocolysis and neuroprotection [place] was done yesterday at [facility] and GBS done Labor team to follow. Wynelle Bourgeois CNM, MSN Certified Nurse-Midwife 07/25/2020 1:56 AM

## 2020-07-25 NOTE — Telephone Encounter (Signed)
TC to discuss importance of needing BMZ. Advised patient to ask to have area numbed up with ice prior to injection. Patient willing to try BMZ, if she can have the area numbed. She states she is "tired of feeling like a [place] pig."   TC to notify Dr. Earlene Plater of phone call.  Jackie Newman, CNM

## 2020-07-26 DIAGNOSIS — O99891 Other specified diseases and conditions complicating pregnancy: Secondary | ICD-10-CM

## 2020-07-26 DIAGNOSIS — R748 Abnormal levels of other serum enzymes: Secondary | ICD-10-CM

## 2020-07-26 DIAGNOSIS — O403XX0 Polyhydramnios, third trimester, not applicable or unspecified: Secondary | ICD-10-CM

## 2020-07-26 DIAGNOSIS — Z3A32 32 weeks gestation of pregnancy: Secondary | ICD-10-CM

## 2020-07-26 DIAGNOSIS — R7401 Elevation of levels of liver transaminase levels: Secondary | ICD-10-CM

## 2020-07-26 DIAGNOSIS — O4703 False labor before 37 completed weeks of gestation, third trimester: Secondary | ICD-10-CM

## 2020-07-26 LAB — COMPREHENSIVE METABOLIC PANEL
ALT: 31 U/L (ref 0–44)
AST: 26 U/L (ref 15–41)
Albumin: 2.6 g/dL — ABNORMAL LOW (ref 3.5–5.0)
Alkaline Phosphatase: 77 U/L (ref 38–126)
Anion gap: 12 (ref 5–15)
BUN: <5 mg/dL — ABNORMAL LOW (ref 6–20)
CO2: 19 mmol/L — ABNORMAL LOW (ref 22–32)
Calcium: 9 mg/dL (ref 8.9–10.3)
Chloride: 105 mmol/L (ref 98–111)
Creatinine, Ser: 0.65 mg/dL (ref 0.44–1.00)
GFR calc Af Amer: >60 mL/min (ref >60)
GFR calc non Af Amer: >60 mL/min (ref >60)
Glucose, Bld: 92 mg/dL (ref 70–99)
Potassium: 3.9 mmol/L (ref 3.5–5.1)
Sodium: 136 mmol/L (ref 135–145)
Total Bilirubin: 0.7 mg/dL (ref 0.3–1.2)
Total Protein: 6 g/dL — ABNORMAL LOW (ref 6.5–8.1)

## 2020-07-26 LAB — AMYLASE: Amylase: 93 U/L (ref 28–100)

## 2020-07-26 MED ORDER — BETAMETHASONE SOD PHOS & ACET 6 (3-3) MG/ML IJ SUSP
12.0000 mg | Freq: Once | INTRAMUSCULAR | Status: AC
  Administered 2020-07-26: 12 mg via INTRAMUSCULAR

## 2020-07-26 MED ORDER — FLUTICASONE PROPIONATE 50 MCG/ACT NA SUSP: 2.0000 | Freq: Every day | NASAL | 0 refills | Status: DC

## 2020-07-26 NOTE — Progress Notes (Signed)
Pt discharged to home with father of her baby.  Condition stable.  Pt ambulated to the car with RN.  No equipment for home ordered at discharge.

## 2020-07-26 NOTE — Discharge Instructions (Signed)
Preterm Labor and Birth Information ° °The normal length of a pregnancy is 39-41 weeks. Preterm labor is when labor starts before 37 completed weeks of pregnancy. °What are the risk factors for preterm labor? °Preterm labor is more likely to occur in women who: °· Have certain infections during pregnancy such as a bladder infection, sexually transmitted infection, or infection inside the uterus (chorioamnionitis). °· Have a shorter-than-normal cervix. °· Have gone into preterm labor before. °· Have had surgery on their cervix. °· Are younger than age 17 or older than age 35. °· Are African American. °· Are pregnant with twins or multiple babies (multiple gestation). °· Take street drugs or smoke while pregnant. °· Do not gain enough weight while pregnant. °· Became pregnant shortly after having been pregnant. °What are the symptoms of preterm labor? °Symptoms of preterm labor include: °· Cramps similar to those that can happen during a menstrual period. The cramps may happen with diarrhea. °· Pain in the abdomen or lower back. °· Regular uterine contractions that may feel like tightening of the abdomen. °· A feeling of increased pressure in the pelvis. °· Increased watery or bloody mucus discharge from the vagina. °· Water breaking (ruptured amniotic sac). °Why is it important to recognize signs of preterm labor? °It is important to recognize signs of preterm labor because babies who are born prematurely may not be fully developed. This can put them at an increased risk for: °· Long-term (chronic) heart and lung problems. °· Difficulty immediately after birth with regulating body systems, including blood sugar, body temperature, heart rate, and breathing rate. °· Bleeding in the brain. °· Cerebral palsy. °· Learning difficulties. °· Death. °These risks are highest for babies who are born before 34 weeks of pregnancy. °How is preterm labor treated? °Treatment depends on the length of your pregnancy, your condition,  and the health of your baby. It may involve: °· Having a stitch (suture) placed in your cervix to prevent your cervix from opening too early (cerclage). °· Taking or being given medicines, such as: °? Hormone medicines. These may be given early in pregnancy to help support the pregnancy. °? Medicine to stop contractions. °? Medicines to help mature the baby’s lungs. These may be prescribed if the risk of delivery is high. °? Medicines to prevent your baby from developing cerebral palsy. °If the labor happens before 34 weeks of pregnancy, you may need to stay in the hospital. °What should I do if I think I am in preterm labor? °If you think that you are going into preterm labor, call your health care provider right away. °How can I prevent preterm labor in future pregnancies? °To increase your chance of having a full-term pregnancy: °· Do not use any tobacco products, such as cigarettes, chewing tobacco, and e-cigarettes. If you need help quitting, ask your health care provider. °· Do not use street drugs or medicines that have not been prescribed to you during your pregnancy. °· Talk with your health care provider before taking any herbal supplements, even if you have been taking them regularly. °· Make sure you gain a healthy amount of weight during your pregnancy. °· Watch for infection. If you think that you might have an infection, get it checked right away. °· Make sure to tell your health care provider if you have gone into preterm labor before. °This information is not intended to replace advice given to you by your health care provider. Make sure you discuss any questions you have with your   health care provider. °Document Revised: 03/10/2019 Document Reviewed: 04/08/2016 °Elsevier Patient Education © 2020 [employer] ° °

## 2020-07-26 NOTE — Discharge Summary (Signed)
Antenatal Physician Discharge Summary  Patient ID: Jackie StaggersShantell Poplaski MRN: [id number] DOB/AGE: Jan 18, 1991 29 y.o.  Admit date: 07/24/2020 Discharge date: 07/26/2020  Admission Diagnoses:Active Problems:   Preterm labor   Elevated amylase   Elevated AST (SGOT)   Polyhydramnios affecting pregnancy    Discharge Diagnoses: same  Prenatal Procedures: NST and ultrasound  Consults: None  Hospital Course:  Jackie StaggersShantell Montag is a 29 y.o. G2P0010 with IUP at 8539w4d admitted for threatened preterm labor.  She was admitted with contractions, noted to have a cervical exam of 1/50/ballotable was a change from closed thick and posterior..  No leaking of fluid and no bleeding.  She was initially started on magnesium sulfate for tocolysis and neuroprotection and also received betamethasone x 2 doses.  Her tocolysis was discontinued. She was observed, fetal heart rate monitoring remained reassuring, and she had no signs/symptoms of progressing preterm labor or other maternal-fetal concerns.  Her cervical exam was unchanged from admission.  She was deemed stable for discharge to home with outpatient follow up.  Discharge Exam: Temp:  [97.7 F (36.5 C)-98.5 F (36.9 C)] 97.9 F (36.6 C) (08/27 1135) Pulse Rate:  [77-103] 103 (08/27 1135) Resp:  [18-20] 18 (08/27 1135) BP: (97-123)/(50-80) 97/56 (08/27 1135) SpO2:  [98 %-100 %] 99 % (08/27 1135) Physical Examination: CONSTITUTIONAL: Well-developed, well-nourished female in no acute distress.  HENT:  Normocephalic, atraumatic, External right and left ear normal. Oropharynx is clear and moist EYES: Conjunctivae and EOM are normal. Pupils are equal, round, and reactive to light. No scleral icterus.  NECK: Normal range of motion, supple, no masses SKIN: Skin is warm and dry. No rash noted. Not diaphoretic. No erythema. No pallor. NEUROLGIC: Alert and oriented to person, place, and time. Normal reflexes, muscle tone coordination. No cranial nerve deficit  noted. PSYCHIATRIC: Normal mood and affect. Normal behavior. Normal judgment and thought content. CARDIOVASCULAR: Normal heart rate noted, regular rhythm RESPIRATORY: Effort and breath sounds normal, no problems with respiration noted MUSCULOSKELETAL: Normal range of motion. No edema and no tenderness. 2+ distal pulses. ABDOMEN: Soft, nontender, nondistended, gravid. CERVIX: Dilation: 1 Effacement (%): Thick Cervical Position: Posterior Station: Ballotable Exam by:: Dr. Mayford KnifeWilliams  Fetal monitoring: FHR: 125 bpm, Variability: moderate, Accelerations: Present, Decelerations: Absent  Uterine activity: Minimal contractions per hour  Significant Diagnostic Studies:  Results for orders placed or performed during the hospital encounter of 07/24/20 (from the past 168 hour(s))  CBC with Differential/Platelet   Collection Time: 07/24/20 10:04 PM  Result Value Ref Range   WBC 15.8 (H) 4.0 - 10.5 K/uL   RBC 4.41 3.87 - 5.11 MIL/uL   Hemoglobin 13.7 12.0 - 15.0 g/dL   HCT 40.940.2 36 - 46 %   MCV 91.2 80.0 - 100.0 fL   MCH 31.1 26.0 - 34.0 pg   MCHC 34.1 30.0 - 36.0 g/dL   RDW 81.113.7 91.411.5 - 78.215.5 %   Platelets 293 150 - 400 K/uL   nRBC 0.0 0.0 - 0.2 %   Neutrophils Relative % 79 %   Neutro Abs 12.5 (H) 1.7 - 7.7 K/uL   Lymphocytes Relative 11 %   Lymphs Abs 1.7 0.7 - 4.0 K/uL   Monocytes Relative 7 %   Monocytes Absolute 1.2 (H) 0 - 1 K/uL   Eosinophils Relative 0 %   Eosinophils Absolute 0.1 0 - 0 K/uL   Basophils Relative 1 %   Basophils Absolute 0.1 0 - 0 K/uL   Immature Granulocytes 2 %   Abs Immature Granulocytes 0.29 (H)  0.00 - 0.07 K/uL  Comprehensive metabolic panel   Collection Time: 07/24/20 10:04 PM  Result Value Ref Range   Sodium 135 135 - 145 mmol/L   Potassium 3.6 3.5 - 5.1 mmol/L   Chloride 103 98 - 111 mmol/L   CO2 17 (L) 22 - 32 mmol/L   Glucose, Bld 90 70 - 99 mg/dL   BUN 5 (L) 6 - 20 mg/dL   Creatinine, Ser 1.91 0.44 - 1.00 mg/dL   Calcium 9.7 8.9 - 47.8 mg/dL    Total Protein 7.5 6.5 - 8.1 g/dL   Albumin 3.3 (L) 3.5 - 5.0 g/dL   AST 46 (H) 15 - 41 U/L   ALT 42 0 - 44 U/L   Alkaline Phosphatase 104 38 - 126 U/L   Total Bilirubin 0.8 0.3 - 1.2 mg/dL   GFR calc non Af Amer >60 >60 mL/min   GFR calc Af Amer >60 >60 mL/min   Anion gap 15 5 - 15  Amylase   Collection Time: 07/24/20 10:04 PM  Result Value Ref Range   Amylase 168 (H) 28 - 100 U/L  Lipase, blood   Collection Time: 07/24/20 10:04 PM  Result Value Ref Range   Lipase 25 11 - 51 U/L  SARS Coronavirus 2 by RT PCR (hospital order, performed in [facility] hospital lab) Nasopharyngeal Nasopharyngeal Swab   Collection Time: 07/24/20 10:05 PM   Specimen: Nasopharyngeal Swab  Result Value Ref Range   SARS Coronavirus 2 NEGATIVE NEGATIVE  Type and screen [facility]   Collection Time: 07/24/20 10:05 PM  Result Value Ref Range   ABO/RH(D) O POS    Antibody Screen NEG    Sample Expiration      07/27/2020,2359 Performed at [facility], [address], [place], [place] [address]   Fetal fibronectin   Collection Time: 07/25/20 12:54 AM  Result Value Ref Range   Fetal Fibronectin POSITIVE (A) NEGATIVE  GC/Chlamydia probe amp ([place])not at [facility]   Collection Time: 07/25/20  3:15 AM  Result Value Ref Range   Neisseria Gonorrhea Negative    Chlamydia Negative    Comment Normal Reference Ranger Chlamydia - Negative    Comment      Normal Reference Range Neisseria Gonorrhea - Negative  Respiratory Panel by PCR   Collection Time: 07/25/20  1:00 PM   Specimen: Nasopharyngeal Swab; Respiratory  Result Value Ref Range   Adenovirus NOT DETECTED NOT DETECTED   Coronavirus 229E NOT DETECTED NOT DETECTED   Coronavirus HKU1 NOT DETECTED NOT DETECTED   Coronavirus NL63 NOT DETECTED NOT DETECTED   Coronavirus OC43 NOT DETECTED NOT DETECTED   Metapneumovirus NOT DETECTED NOT DETECTED   Rhinovirus / Enterovirus NOT DETECTED NOT DETECTED   Influenza A NOT DETECTED NOT  DETECTED   Influenza B NOT DETECTED NOT DETECTED   Parainfluenza Virus 1 NOT DETECTED NOT DETECTED   Parainfluenza Virus 2 NOT DETECTED NOT DETECTED   Parainfluenza Virus 3 NOT DETECTED NOT DETECTED   Parainfluenza Virus 4 NOT DETECTED NOT DETECTED   Respiratory Syncytial Virus DETECTED (A) NOT DETECTED   Bordetella pertussis NOT DETECTED NOT DETECTED   Chlamydophila pneumoniae NOT DETECTED NOT DETECTED   Mycoplasma pneumoniae NOT DETECTED NOT DETECTED  Amylase   Collection Time: 07/26/20  5:58 AM  Result Value Ref Range   Amylase 93 28 - 100 U/L  Comprehensive metabolic panel   Collection Time: 07/26/20  5:58 AM  Result Value Ref Range   Sodium  136 135 - 145 mmol/L   Potassium 3.9 3.5 - 5.1 mmol/L   Chloride 105 98 - 111 mmol/L   CO2 19 (L) 22 - 32 mmol/L   Glucose, Bld 92 70 - 99 mg/dL   BUN <5 (L) 6 - 20 mg/dL   Creatinine, Ser 8.93 0.44 - 1.00 mg/dL   Calcium 9.0 8.9 - 81.0 mg/dL   Total Protein 6.0 (L) 6.5 - 8.1 g/dL   Albumin 2.6 (L) 3.5 - 5.0 g/dL   AST 26 15 - 41 U/L   ALT 31 0 - 44 U/L   Alkaline Phosphatase 77 38 - 126 U/L   Total Bilirubin 0.7 0.3 - 1.2 mg/dL   GFR calc non Af Amer >60 >60 mL/min   GFR calc Af Amer >60 >60 mL/min   Anion gap 12 5 - 15   [place] MFM FETAL BPP WO NON STRESS  Result Date: 07/24/2020 ----------------------------------------------------------------------  OBSTETRICS REPORT                       (Signed Final 07/24/2020 02:39 pm) ---------------------------------------------------------------------- Patient Info  ID #:       [id number]                          D.O.B.:  [birth year] (28 yrs)  Name:       Jackie Newman                Visit Date: 07/24/2020 09:22 am ---------------------------------------------------------------------- Performed By  Attending:        Lin Landsman      Referred By:      [facility]                    MD  Performed By:     Lenise Arena        [facility]                    RD[facility]                                      [facility] ---------------------------------------------------------------------- Orders  #  Description                           Code        Ordered By  1  [place] MFM FETAL BPP WO NON               27782.42    RAVI SHANKAR     STRESS ----------------------------------------------------------------------  #  Order #                     Accession #                Episode #  1  [id number]                   [id number]                 [id number] ---------------------------------------------------------------------- Indications  Polyhydramnios, third trimester, antepartum    O40.3XX1  condition or complication, fetus 1  Hypertension - Chronic/Pre-existing            O10.019  (Labetalol)  Low Risk NIPS, Neg Horizon & Neg AFP  [redacted] weeks gestation of pregnancy                Z3A.32 ---------------------------------------------------------------------- Fetal Evaluation  Num Of Fetuses:         1  Fetal Heart Rate(bpm):  131  Cardiac Activity:       Observed  Presentation:           Cephalic  Placenta:               Anterior  P. Cord Insertion:      Previously Visualized  Amniotic Fluid  AFI FV:      Polyhydramnios  AFI Sum(cm)     %Tile       Largest Pocket(cm)  25.36           96          8.8  RUQ(cm)       RLQ(cm)       LUQ(cm)        LLQ(cm)  4.16          7.9           4.5            8.8 ---------------------------------------------------------------------- Biophysical Evaluation  Amniotic F.V:   Within normal limits       F. Tone:        Observed  F. Movement:    Observed                   Score:          8/8  F. Breathing:   Observed ---------------------------------------------------------------------- OB History  Gravidity:    2         Term:   0         SAB:   1  Living:       0 ----------------------------------------------------------------------  Gestational Age  LMP:           32w 2d        Date:  12/11/19                 EDD:   09/16/20  Best:          Armida Sans 2d     Det. By:  LMP  (12/11/19)          EDD:   09/16/20 ---------------------------------------------------------------------- Anatomy  Thoracic:              Appears normal         Bladder:                Appears normal  Stomach:               Appears normal, left  sided ---------------------------------------------------------------------- Cervix Uterus Adnexa  Cervix  Not visualized (advanced GA >24wks) ---------------------------------------------------------------------- Impression  Antenatal testing due to polyhydramnios  Chronic hypertension on antihypertensive therapy.  Biophsical profile 8/8 with good fetal movement and elevated  amniotic fluid volume. ---------------------------------------------------------------------- Recommendations  Continue weekly testing. ----------------------------------------------------------------------               Lin Landsman, MD Electronically Signed Final Report   07/24/2020 02:39 pm ----------------------------------------------------------------------  [place] MFM FETAL BPP WO NON STRESS  Result Date: 07/15/2020 ----------------------------------------------------------------------  OBSTETRICS REPORT                       (Signed Final 07/15/2020 04:03 pm) ---------------------------------------------------------------------- Patient Info  ID #:       [id number]                          D.O.B.:  1991-10-27 (28 yrs)  Name:       Jackie Newman                Visit Date: 07/15/2020 03:50 pm ---------------------------------------------------------------------- Performed By  Attending:        Noralee Space MD        Referred By:      [facility]  Performed By:     Sandi Mealy        Location[facility]                    RD[facility]                                     [facility] ---------------------------------------------------------------------- Orders  #  Description                           Code        Ordered By  1  [place] MFM FETAL BPP WO NON               76819.01    YU FANG     STRESS  2  [place] MFM OB FOLLOW UP                   82956.21    YU FANG ----------------------------------------------------------------------  #  Order #                     Accession #                Episode #  1  [id number]  [id number]                 [id number]  2  [id number]                   [id number]                 [id number] ---------------------------------------------------------------------- Indications  Encounter for other antenatal screening        Z36.2  follow-up  Polyhydramnios, third trimester, antepartum    O40.3XX1  condition or complication, fetus 1  Hypertension - Chronic/Pre-existing            O10.019  (Labetalol)  Low Risk NIPS, Neg Horizon & Neg AFP  [redacted] weeks gestation of pregnancy                Z3A.31 ---------------------------------------------------------------------- Fetal Evaluation  Num Of Fetuses:         1  Fetal Heart Rate(bpm):  150  Cardiac Activity:       Observed  Presentation:           Cephalic  Placenta:               Anterior  P. Cord Insertion:      Visualized  Amniotic Fluid  AFI FV:      Within normal limits  AFI Sum(cm)     %Tile       Largest Pocket(cm)  23.74           95          9.01  RUQ(cm)       RLQ(cm)       LUQ(cm)        LLQ(cm)  9.01          3.65          8.21           2.87 ---------------------------------------------------------------------- Biophysical Evaluation  Amniotic F.V:   Pocket => 2 cm             F. Tone:        Observed  F. Movement:    Observed                   Score:          8/8  F. Breathing:   Observed ---------------------------------------------------------------------- Biometry  BPD:      84.1  mm     G. Age:  33w  6d         98  %    CI:        80.23   %    70 - 86                                                          FL/HC:      19.7   %    19.3 - 21.3  HC:      296.6  mm     G. Age:  32w 6d         64  %    HC/AC:      1.07        0.96 - 1.17  AC:      277.7  mm     G. Age:  31w 6d  71  %    FL/BPD:     69.4   %    71 - 87  FL:       58.4  mm     G. Age:  30w 4d         23  %    FL/AC:      21.0   %    20 - 24  HUM:        51  mm     G. Age:  29w 6d         28  %  LV:        4.4  mm  Est. FW:    1818  gm           4 lb     62  % ---------------------------------------------------------------------- OB History  Gravidity:    2         Term:   0         SAB:   1  Living:       0 ---------------------------------------------------------------------- Gestational Age  LMP:           31w 0d        Date:  12/11/19                 EDD:   09/16/20  U/S Today:     32w 2d                                        EDD:   09/07/20  Best:          31w 0d     Det. By:  LMP  (12/11/19)          EDD:   09/16/20 ---------------------------------------------------------------------- Anatomy  Cranium:               Appears normal         Aortic Arch:            Previously seen  Cavum:                 Appears normal         Ductal Arch:            Previously seen  Ventricles:            Appears normal         Diaphragm:              Appears normal  Choroid Plexus:        Previously seen        Stomach:                Appears normal, left                                                                        sided  Cerebellum:            Previously seen        Abdomen:                Appears normal  Posterior Fossa:  Previously seen        Abdominal Wall:         Previously seen  Nuchal Fold:           Not applicable (>20    Cord Vessels:           Previously seen                         wks GA)  Face:                  Orbits and profile     Kidneys:                Appear normal                         previously seen  Lips:                   Previously seen        Bladder:                Appears normal  Thoracic:              Appears normal         Spine:                  Previously seen  Heart:                 Previously seen        Upper Extremities:      Previously seen  RVOT:                  Previously seen        Lower Extremities:      Previously seen  LVOT:                  Previously seen ---------------------------------------------------------------------- Impression  Chronic hypertension.  Well controlled on labetalol.  Patient  takes labetalol 200 mg twice daily.  Blood pressure today at  our office is 120/74 mmHg.  Fetal growth is appropriate for gestational age .Amniotic fluid  is normal and good fetal activity is seen .Antenatal testing is  reassuring. BPP 8/8.  We reassured the patient of the findings. ---------------------------------------------------------------------- Recommendations  -Continue weekly BPP till delivery. ----------------------------------------------------------------------                  Noralee Space, MD Electronically Signed Final Report   07/15/2020 04:03 pm ----------------------------------------------------------------------  [place] MFM FETAL BPP WO NON STRESS  Result Date: 07/01/2020 ----------------------------------------------------------------------  OBSTETRICS REPORT                       (Signed Final 07/01/2020 09:38 am) ---------------------------------------------------------------------- Patient Info  ID #:       [id number]                          D.O.B.:  1991/01/11 (28 yrs)  Name:       Jackie Newman                Visit Date: 07/01/2020 09:05 am ---------------------------------------------------------------------- Performed By  Attending:        Ma Rings MD         Referred By:      [facility]  Performed By:     Sandi Mealy  Location[facility]                    RD[facility]                                     [facility] ---------------------------------------------------------------------- Orders  #  Description                           Code        Ordered By  1  [place] MFM OB LIMITED                     76815.01    YU FANG  2  [place] MFM FETAL BPP WO NON               76819.01    YU FANG     STRESS ----------------------------------------------------------------------  #  Order #                     Accession #                Episode #  1  [id number]                   [id number]                 [id number]  2  [id number]                   [id number]                 [id number] ---------------------------------------------------------------------- Indications  Polyhydramnios, third trimester, antepartum    O40.3XX1  condition or complication, fetus 1  Hypertension - Chronic/Pre-existing            O10.019  (Labetalol)  Low Risk NIPS, Neg Horizon & Neg AFP  [redacted] weeks gestation of pregnancy                Z3A.29 ---------------------------------------------------------------------- Fetal Evaluation  Num Of Fetuses:         1  Fetal Heart Rate(bpm):  154  Cardiac Activity:       Observed  Presentation:           Cephalic  Placenta:               Anterior  P. Cord Insertion:      Visualized  Amniotic Fluid  AFI FV:      Subjectively increased  AFI Sum(cm)     %Tile       Largest Pocket(cm)  25.68           >  97        8.49  RUQ(cm)       RLQ(cm)       LUQ(cm)        LLQ(cm)  6.63          3.77          6.79           8.49 ---------------------------------------------------------------------- Biometry  LV:        3.9  mm ---------------------------------------------------------------------- OB History  Gravidity:    2         Term:   0         SAB:   1  Living:       0 ---------------------------------------------------------------------- Gestational Age  LMP:           29w 0d        Date:  12/11/19                 EDD:   09/16/20  Best:           29w 0d     Det. By:  LMP  (12/11/19)          EDD:   09/16/20 ---------------------------------------------------------------------- Anatomy  Cranium:               Previously seen        Aortic Arch:            Previously seen  Cavum:                 Previously seen        Ductal Arch:            Previously seen  Ventricles:            Appears normal         Diaphragm:              Appears normal  Choroid Plexus:        Previously seen        Stomach:                Appears normal, left                                                                        sided  Cerebellum:            Previously seen        Abdomen:                Previously seen  Posterior Fossa:       Previously seen        Abdominal Wall:         Previously seen  Nuchal Fold:           Not applicable (>20    Cord Vessels:           Previously seen                         wks GA)  Face:                  Orbits and profile     Kidneys:  Previously seen                         previously seen  Lips:                  Previously seen        Bladder:                Appears normal  Thoracic:              Previously seen        Spine:                  Previously seen  Heart:                 Previously seen        Upper Extremities:      Previously seen  RVOT:                  Previously seen        Lower Extremities:      Previously seen  LVOT:                  Previously seen  Other:  Heels previously visualized. Nasal bone visualized. ---------------------------------------------------------------------- Comments  This patient was seen for fetal testing due to polyhydramnios  that was noted during her last ultrasound exam.  She reports  that she has screened negative for gestational diabetes and  reports feeling vigorous fetal movements throughout the day.  A biophysical profile performed today was 8 out of 8.  Polyhydramnios continues to be noted on today's ultrasound  exam.  She will return in 1 week for another biophysical profile.  ----------------------------------------------------------------------                   Ma Rings, MD Electronically Signed Final Report   07/01/2020 09:38 am ----------------------------------------------------------------------  [place] MFM OB FOLLOW UP  Result Date: 07/15/2020 ----------------------------------------------------------------------  OBSTETRICS REPORT                       (Signed Final 07/15/2020 04:03 pm) ---------------------------------------------------------------------- Patient Info  ID #:       [id number]                          D.O.B.:  [birth year] (28 yrs)  Name:       Jackie Newman                Visit Date: 07/15/2020 03:50 pm ---------------------------------------------------------------------- Performed By  Attending:        Noralee Space MD        Referred By:      [facility]  Performed By:     Sandi Mealy        [facility]                    RD[facility]                                     [facility] ---------------------------------------------------------------------- Orders  #  Description                           Code        Ordered By  1  [place] MFM FETAL BPP WO NON               [id number]    YU FANG     STRESS  2  [place] MFM OB FOLLOW UP                   [id number]    YU FANG ----------------------------------------------------------------------  #  Order #                     Accession #                Episode #  1  [id number]                   [id number]                 [id number]  2  [id number]                   [id number]                 [id number] ---------------------------------------------------------------------- Indications  Encounter for other antenatal screening        Z36.2  follow-up  Polyhydramnios, third trimester, antepartum    O40.3XX1  condition or complication, fetus 1  Hypertension -  Chronic/Pre-existing            O10.019  (Labetalol)  Low Risk NIPS, Neg Horizon & Neg AFP  [redacted] weeks gestation of pregnancy                Z3A.31 ---------------------------------------------------------------------- Fetal Evaluation  Num Of Fetuses:         1  Fetal Heart Rate(bpm):  150  Cardiac Activity:       Observed  Presentation:           Cephalic  Placenta:               Anterior  P. Cord Insertion:      Visualized  Amniotic Fluid  AFI FV:      Within normal limits  AFI Sum(cm)     %Tile       Largest Pocket(cm)  23.74           95          9.01  RUQ(cm)       RLQ(cm)       LUQ(cm)        LLQ(cm)  9.01          3.65          8.21           2.87 ---------------------------------------------------------------------- Biophysical Evaluation  Amniotic F.V:   Pocket => 2 cm             F. Tone:        Observed  F. Movement:  Observed                   Score:          8/8  F. Breathing:   Observed ---------------------------------------------------------------------- Biometry  BPD:      84.1  mm     G. Age:  33w 6d         98  %    CI:        80.23   %    70 - 86                                                          FL/HC:      19.7   %    19.3 - 21.3  HC:      296.6  mm     G. Age:  32w 6d         64  %    HC/AC:      1.07        0.96 - 1.17  AC:      277.7  mm     G. Age:  31w 6d         71  %    FL/BPD:     69.4   %    71 - 87  FL:       58.4  mm     G. Age:  30w 4d         23  %    FL/AC:      21.0   %    20 - 24  HUM:        51  mm     G. Age:  29w 6d         28  %  LV:        4.4  mm  Est. FW:    1818  gm           4 lb     62  % ---------------------------------------------------------------------- OB History  Gravidity:    2         Term:   0         SAB:   1  Living:       0 ---------------------------------------------------------------------- Gestational Age  LMP:           31w 0d        Date:  12/11/19                 EDD:   09/16/20  U/S Today:     32w 2d                                         EDD:   09/07/20  Best:          31w 0d     Det. By:  LMP  (12/11/19)          EDD:   09/16/20 ---------------------------------------------------------------------- Anatomy  Cranium:               Appears normal         Aortic Arch:  Previously seen  Cavum:                 Appears normal         Ductal Arch:            Previously seen  Ventricles:            Appears normal         Diaphragm:              Appears normal  Choroid Plexus:        Previously seen        Stomach:                Appears normal, left                                                                        sided  Cerebellum:            Previously seen        Abdomen:                Appears normal  Posterior Fossa:       Previously seen        Abdominal Wall:         Previously seen  Nuchal Fold:           Not applicable (>20    Cord Vessels:           Previously seen                         wks GA)  Face:                  Orbits and profile     Kidneys:                Appear normal                         previously seen  Lips:                  Previously seen        Bladder:                Appears normal  Thoracic:              Appears normal         Spine:                  Previously seen  Heart:                 Previously seen        Upper Extremities:      Previously seen  RVOT:                  Previously seen        Lower Extremities:      Previously seen  LVOT:                  Previously seen ---------------------------------------------------------------------- Impression  Chronic hypertension.  Well controlled on labetalol.  Patient  takes labetalol 200 mg twice daily.  Blood pressure  today at  our office is 120/74 mmHg.  Fetal growth is appropriate for gestational age .Amniotic fluid  is normal and good fetal activity is seen .Antenatal testing is  reassuring. BPP 8/8.  We reassured the patient of the findings. ---------------------------------------------------------------------- Recommendations  -Continue weekly BPP till  delivery. ----------------------------------------------------------------------                  Noralee Space, MD Electronically Signed Final Report   07/15/2020 04:03 pm ----------------------------------------------------------------------  [place] MFM OB LIMITED  Result Date: 07/01/2020 ----------------------------------------------------------------------  OBSTETRICS REPORT                       (Signed Final 07/01/2020 09:38 am) ---------------------------------------------------------------------- Patient Info  ID #:       [id number]                          D.O.B.:  [birth year] (28 yrs)  Name:       Jackie Newman                Visit Date: 07/01/2020 09:05 am ---------------------------------------------------------------------- Performed By  Attending:        Ma Rings MD         Referred By:      [facility]  Performed By:     Sandi Mealy        [facility] ---------------------------------------------------------------------- Orders  #  Description                           Code        Ordered By  1  [place] MFM OB LIMITED                     76815.01    YU FANG  2  [place] MFM FETAL BPP WO NON               40981.19    YU FANG     STRESS ----------------------------------------------------------------------  #  Order #                     Accession #                Episode #  1  [id number]  [id number]                 [id number]  2  [id number]                   [id number]                 [id number] ---------------------------------------------------------------------- Indications  Polyhydramnios, third trimester, antepartum    O40.3XX1  condition or complication, fetus 1  Hypertension - Chronic/Pre-existing            O10.019  (Labetalol)  Low Risk NIPS, Neg  Horizon & Neg AFP  [redacted] weeks gestation of pregnancy                Z3A.29 ---------------------------------------------------------------------- Fetal Evaluation  Num Of Fetuses:         1  Fetal Heart Rate(bpm):  154  Cardiac Activity:       Observed  Presentation:           Cephalic  Placenta:               Anterior  P. Cord Insertion:      Visualized  Amniotic Fluid  AFI FV:      Subjectively increased  AFI Sum(cm)     %Tile       Largest Pocket(cm)  25.68           > 97        8.49  RUQ(cm)       RLQ(cm)       LUQ(cm)        LLQ(cm)  6.63          3.77          6.79           8.49 ---------------------------------------------------------------------- Biometry  LV:        3.9  mm ---------------------------------------------------------------------- OB History  Gravidity:    2         Term:   0         SAB:   1  Living:       0 ---------------------------------------------------------------------- Gestational Age  LMP:           29w 0d        Date:  12/11/19                 EDD:   09/16/20  Best:          29w 0d     Det. By:  LMP  (12/11/19)          EDD:   09/16/20 ---------------------------------------------------------------------- Anatomy  Cranium:               Previously seen        Aortic Arch:            Previously seen  Cavum:                 Previously seen        Ductal Arch:            Previously seen  Ventricles:            Appears normal         Diaphragm:              Appears normal  Choroid Plexus:        Previously seen        Stomach:  Appears normal, left                                                                        sided  Cerebellum:            Previously seen        Abdomen:                Previously seen  Posterior Fossa:       Previously seen        Abdominal Wall:         Previously seen  Nuchal Fold:           Not applicable (>20    Cord Vessels:           Previously seen                         wks GA)  Face:                  Orbits and profile     Kidneys:                 Previously seen                         previously seen  Lips:                  Previously seen        Bladder:                Appears normal  Thoracic:              Previously seen        Spine:                  Previously seen  Heart:                 Previously seen        Upper Extremities:      Previously seen  RVOT:                  Previously seen        Lower Extremities:      Previously seen  LVOT:                  Previously seen  Other:  Heels previously visualized. Nasal bone visualized. ---------------------------------------------------------------------- Comments  This patient was seen for fetal testing due to polyhydramnios  that was noted during her last ultrasound exam.  She reports  that she has screened negative for gestational diabetes and  reports feeling vigorous fetal movements throughout the day.  A biophysical profile performed today was 8 out of 8.  Polyhydramnios continues to be noted on today's ultrasound  exam.  She will return in 1 week for another biophysical profile. ----------------------------------------------------------------------                   Ma Rings, MD Electronically Signed Final Report   07/01/2020 09:38 am ----------------------------------------------------------------------  US ABDOMEN LIMITED RUQ  Result Date: 07/25/2020 CLINICAL DATA:  Upper abdominal pain, [redacted] weeks pregnant, elevated serum amylase EXAM: ULTRASOUND  ABDOMEN LIMITED RIGHT UPPER QUADRANT COMPARISON:  None. FINDINGS: Gallbladder: No gallstones or wall thickening visualized. No sonographic Murphy sign noted by sonographer. Common bile duct: Diameter: 3 mm in mid diameter Liver: No focal lesion identified. Within normal limits in parenchymal echogenicity. Portal vein is patent on color Doppler imaging with normal direction of blood flow towards the liver. Other: No ascites identified within the right upper quadrant. IMPRESSION: Unremarkable right upper quadrant ultrasound as described.  Electronically Signed   By: Helyn Numbers MD   On: 07/25/2020 01:42    Future Appointments  Date Time Provider Department Center  07/29/2020  7:45 AM [facility] NURSE [facility]  07/29/2020  8:00 AM [facility] US1 [facility]  08/07/2020  7:15 AM [facility] NURSE [facility]  08/07/2020  7:30 AM [facility] US3 [facility]  08/13/2020  9:15 AM [facility] NURSE [facility]  08/13/2020  9:30 AM [facility] US3 [facility]    Discharge Condition: Stable  Discharge disposition: 01-Home or Self Care       Discharge Instructions    Discharge activity:  No Restrictions   Complete by: As directed    Discharge diet:  No restrictions   Complete by: As directed    Do not have sex or do anything that might make you have an orgasm   Complete by: As directed    Fetal Kick Count:  Lie on our left side for one hour after a meal, and count the number of times your baby kicks.  If it is less than 5 times, get up, move around and drink some juice.  Repeat the test 30 minutes later.  If it is still less than 5 kicks in an hour, notify your doctor.   Complete by: As directed    Notify physician for a general feeling that "something is not right"   Complete by: As directed    Notify physician for increase or change in vaginal discharge   Complete by: As directed    Notify physician for intestinal cramps, with or without diarrhea, sometimes described as "gas pain"   Complete by: As directed    Notify physician for leaking of fluid   Complete by: As directed    Notify physician for low, dull backache, unrelieved by heat or Tylenol   Complete by: As directed    Notify physician for menstrual like cramps   Complete by: As directed    Notify physician for pelvic pressure   Complete by: As directed    Notify physician for uterine contractions.  These may be painless and feel like the uterus is tightening or the baby is  "balling up"   Complete by: As directed    Notify physician for vaginal bleeding   Complete by: As  directed    PRETERM LABOR:  Includes any of the follwing symptoms that occur between 20 - [redacted] weeks gestation.  If these symptoms are not stopped, preterm labor can result in preterm delivery, placing your baby at risk   Complete by: As directed      Allergies as of 07/26/2020      Reactions   Doxycycline Nausea And Vomiting      Medication List    TAKE these medications   albuterol 108 (90 Base) MCG/ACT inhaler Commonly known as: VENTOLIN HFA Inhale 1-2 puffs into the lungs every 6 (six) hours as needed for wheezing or shortness of breath.   aspirin 81 MG chewable tablet Chew 1 tablet (81 mg total) by mouth daily.  Blood Pressure Monitor Automat Devi 1 Device by Does not apply route daily. Automatic blood pressure cuff regular size. To monitor blood pressure regularly at home. ICD-10 code: O09.90   butalbital-acetaminophen-caffeine 50-325-40 MG tablet Commonly known as: FIORICET Take 1-2 tablets by mouth every 6 (six) hours as needed for headache.   fluticasone 50 MCG/ACT nasal spray Commonly known as: FLONASE Place 2 sprays into both nostrils daily for 6 days. Start taking on: July 27, 2020   Gojji Weight Scale Misc 1 Device by Does not apply route daily as needed. To weight self daily as needed at home. ICD-10 code: O09.90   guaiFENesin 600 MG 12 hr tablet Commonly known as: MUCINEX Take 600 mg by mouth 2 (two) times daily as needed for cough.   labetalol 200 MG tablet Commonly known as: NORMODYNE Take 1 tablet (200 mg total) by mouth 2 (two) times daily.   promethazine 25 MG tablet Commonly known as: PHENERGAN Take 0.5-1 tablets (12.5-25 mg total) by mouth every 6 (six) hours as needed for nausea or vomiting.   terconazole 0.4 % vaginal cream Commonly known as: TERAZOL 7 Place 1 applicator vaginally at bedtime.   Wrist Splint/Cock-Up/Left L Misc 1 Device by Does not apply route 3 times/day as needed-between meals & bedtime.   Wrist Splint/Cock-Up/Right L  Misc 1 Device by Does not apply route as needed.       Follow-up Information    [facility] Follow up.   Specialty: Obstetrics and Gynecology Contact information: [address] [place] [address] [phone]              Signed: Reva Bores M.D. 07/26/2020, 1:59 PM

## 2020-07-27 LAB — CULTURE, BETA STREP (GROUP B ONLY)

## 2020-07-27 LAB — OB RESULTS CONSOLE GBS: GBS: POSITIVE

## 2020-07-28 ENCOUNTER — Encounter: Payer: Self-pay | Admitting: Student

## 2020-07-28 DIAGNOSIS — B951 Streptococcus, group B, as the cause of diseases classified elsewhere: Secondary | ICD-10-CM | POA: Insufficient documentation

## 2020-07-29 ENCOUNTER — Other Ambulatory Visit: Payer: Self-pay

## 2020-07-29 ENCOUNTER — Ambulatory Visit: Payer: Medicaid Other | Attending: Obstetrics and Gynecology

## 2020-07-29 ENCOUNTER — Encounter: Payer: Self-pay | Admitting: *Deleted

## 2020-07-29 ENCOUNTER — Ambulatory Visit: Payer: Medicaid Other | Admitting: *Deleted

## 2020-07-29 DIAGNOSIS — O10013 Pre-existing essential hypertension complicating pregnancy, third trimester: Secondary | ICD-10-CM

## 2020-07-29 DIAGNOSIS — Z362 Encounter for other antenatal screening follow-up: Secondary | ICD-10-CM

## 2020-07-29 DIAGNOSIS — A5901 Trichomonal vulvovaginitis: Secondary | ICD-10-CM | POA: Insufficient documentation

## 2020-07-29 DIAGNOSIS — O10919 Unspecified pre-existing hypertension complicating pregnancy, unspecified trimester: Secondary | ICD-10-CM | POA: Diagnosis present

## 2020-07-29 DIAGNOSIS — O210 Mild hyperemesis gravidarum: Secondary | ICD-10-CM | POA: Diagnosis present

## 2020-07-29 DIAGNOSIS — Z3480 Encounter for supervision of other normal pregnancy, unspecified trimester: Secondary | ICD-10-CM | POA: Insufficient documentation

## 2020-07-29 DIAGNOSIS — O403XX1 Polyhydramnios, third trimester, fetus 1: Secondary | ICD-10-CM

## 2020-07-29 DIAGNOSIS — Z3A33 33 weeks gestation of pregnancy: Secondary | ICD-10-CM

## 2020-08-02 ENCOUNTER — Other Ambulatory Visit: Payer: Self-pay | Admitting: Obstetrics and Gynecology

## 2020-08-02 ENCOUNTER — Encounter: Payer: Self-pay | Admitting: General Practice

## 2020-08-02 DIAGNOSIS — O10919 Unspecified pre-existing hypertension complicating pregnancy, unspecified trimester: Secondary | ICD-10-CM

## 2020-08-07 ENCOUNTER — Ambulatory Visit: Payer: Medicaid Other | Admitting: *Deleted

## 2020-08-07 ENCOUNTER — Ambulatory Visit: Payer: Medicaid Other | Attending: Obstetrics and Gynecology

## 2020-08-07 ENCOUNTER — Other Ambulatory Visit: Payer: Self-pay

## 2020-08-07 ENCOUNTER — Ambulatory Visit (INDEPENDENT_AMBULATORY_CARE_PROVIDER_SITE_OTHER): Payer: Medicaid Other | Admitting: Student

## 2020-08-07 ENCOUNTER — Encounter: Payer: Self-pay | Admitting: *Deleted

## 2020-08-07 VITALS — BP 117/79 | HR 95 | Wt 191.8 lb

## 2020-08-07 DIAGNOSIS — O403XX0 Polyhydramnios, third trimester, not applicable or unspecified: Secondary | ICD-10-CM

## 2020-08-07 DIAGNOSIS — A5901 Trichomonal vulvovaginitis: Secondary | ICD-10-CM | POA: Diagnosis present

## 2020-08-07 DIAGNOSIS — O210 Mild hyperemesis gravidarum: Secondary | ICD-10-CM | POA: Diagnosis present

## 2020-08-07 DIAGNOSIS — Z3480 Encounter for supervision of other normal pregnancy, unspecified trimester: Secondary | ICD-10-CM | POA: Insufficient documentation

## 2020-08-07 DIAGNOSIS — I10 Essential (primary) hypertension: Secondary | ICD-10-CM

## 2020-08-07 DIAGNOSIS — O10919 Unspecified pre-existing hypertension complicating pregnancy, unspecified trimester: Secondary | ICD-10-CM | POA: Insufficient documentation

## 2020-08-07 DIAGNOSIS — O219 Vomiting of pregnancy, unspecified: Secondary | ICD-10-CM

## 2020-08-07 DIAGNOSIS — Z3A34 34 weeks gestation of pregnancy: Secondary | ICD-10-CM

## 2020-08-07 DIAGNOSIS — O10913 Unspecified pre-existing hypertension complicating pregnancy, third trimester: Secondary | ICD-10-CM | POA: Diagnosis not present

## 2020-08-07 MED ORDER — PROMETHAZINE HCL 25 MG PO TABS: 25.0000 mg | ORAL_TABLET | Freq: Four times a day (QID) | ORAL | 1 refills | Status: DC | PRN

## 2020-08-07 NOTE — Progress Notes (Signed)
Pt. Presents for ROB, [redacted]w[redacted]d.  Pt. Denies any questions or concerns at this time

## 2020-08-07 NOTE — Progress Notes (Signed)
   PRENATAL VISIT NOTE  Subjective:  Jackie Newman is a 29 y.o. G2P0010 at [redacted]w[redacted]d being seen today for ongoing prenatal care.  She is currently monitored for the following issues for this high-risk pregnancy and has Morning sickness; Supervision of other normal pregnancy, antepartum; Trichomonas vaginalis (TV) infection; Chronic hypertension affecting pregnancy; LGSIL on Pap smear of cervix; Supervision of high risk pregnancy in third trimester; Preterm labor; Elevated amylase; Elevated AST (SGOT); Polyhydramnios affecting pregnancy; and Positive GBS test on their problem list.  Patient reports no complaints.  Contractions: Not present. Vag. Bleeding: None.  Movement: Present. Denies leaking of fluid.   The following portions of the patient's history were reviewed and updated as appropriate: allergies, current medications, past family history, past medical history, past social history, past surgical history and problem list.   Objective:   Vitals:   08/07/20 0902  BP: 117/79  Pulse: 95  Weight: 191 lb 12.8 oz (87 kg)    Fetal Status: Fetal Heart Rate (bpm): 148   Movement: Present     General:  Alert, oriented and cooperative. Patient is in no acute distress.  Skin: Skin is warm and dry. No rash noted.   Cardiovascular: Normal heart rate noted  Respiratory: Normal respiratory effort, no problems with respiration noted  Abdomen: Soft, gravid, appropriate for gestational age.  Pain/Pressure: Absent     Pelvic: Cervical exam deferred        Extremities: Normal range of motion.     Mental Status: Normal mood and affect. Normal behavior. Normal judgment and thought content.   Assessment and Plan:  Pregnancy: G2P0010 at [redacted]w[redacted]d 1. Supervision of other normal pregnancy, antepartum -doing well. Reviewed upcoming visits. Was admitted a few weeks ago for PTL & positive FFN. Had positive GBS during that admission - discussed tx during labor. No issues since that admission.  2. Nausea/vomiting  in pregnancy occasional nausea & vomiting. Requesting refill for her phenergan - promethazine (PHENERGAN) 25 MG tablet; Take 1 tablet (25 mg total) by mouth every 6 (six) hours as needed for nausea or vomiting.  Dispense: 30 tablet; Refill: 1  3. Chronic hypertension affecting pregnancy -well controlled on labetalol 200 mg BID. Discussed delivery at 38-39 wks unless otherwise indicated.  -Continues to have weekly BPPs. Had one this morning- pt reports verbally being told it was good - results still pending  4. [redacted] weeks gestation of pregnancy    Preterm labor symptoms and general obstetric precautions including but not limited to vaginal bleeding, contractions, leaking of fluid and fetal movement were reviewed in detail with the patient. Please refer to After Visit Summary for other counseling recommendations.   Return in about 2 weeks (around 08/21/2020) for Routine OB, in person.  Future Appointments  Date Time Provider Department Center  08/13/2020  9:15 AM [facility] NURSE [facility]  08/13/2020  9:30 AM [facility] US3 [facility]  08/21/2020 10:10 AM Raelyn Mora, CNM [facility] None  08/29/2020  9:30 AM Marny Lowenstein, PA-C [facility] None  09/05/2020 10:50 AM Raelyn Mora, CNM [facility] None  09/12/2020 10:10 AM Raelyn Mora, CNM [facility] None    Judeth Horn, NP

## 2020-08-07 NOTE — Patient Instructions (Addendum)
AREA PEDIATRIC/FAMILY PRACTICE PHYSICIANS  [place] ([address]) . [facility] o Chambliss, MD; Eniola, MD; Hale, MD; Hensel, MD; McDiarmid, MD; McIntyer, MD; Neal, MD; Walden, MD o [address] [place], [place] [address] o [phone] o Mon-Fri 8:30-12:30, 1:30-5:00 o Providers come to see babies at [facility] o Accepting Medicaid . [facility] o Limited providers who accept newborns: Koirala, MD; Morrow, MD; Wolters, MD o [address], [place], [place] [address] o [phone] o Mon-Fri 8:00-5:30 o Babies seen by providers at [facility] o Does NOT accept Medicaid o Please call early in hospitalization for appointment (limited availability)  . [facility] o Mulberry, MD o [address] [place], [place] [address] o [phone] o Mon, Tue, Thur, Fri 8:30-5:00, Wed 10:00-7:00 (closed 1-2pm) o Babies seen by [facility] providers o Accepting Medicaid . [facility] - Pediatrician o Rubin, MD o [address], [place], [place] [address] o [phone] o Mon-Fri 8:30-5:00, Sat 8:30-12:00 o Provider comes to see babies at [facility] o Accepting Medicaid o Must have been referred from current patients or contacted office prior to delivery . [facility] ([facility]) o Brown, MD; Chandler, MD; Ettefagh, MD; Grant, MD; Lester, MD; McCormick, MD; McQueen, MD; Prose, MD; Simha, MD; Stanley, MD; Stryffeler, NP; Tebben, NP o [address], [place], [place] [address] o [phone] o Mon, Tue, Thur, Fri 8:30-5:30, Wed 9:30-5:30, Sat 8:30-12:30 o Babies seen by [facility] providers o Accepting Medicaid o Only accepting infants of first-time parents or siblings of current patients o Hospital discharge coordinator will make follow-up appointment . Jack Amos o [address],  [place], [place]  [address] o [phone]   Fax - [phone] . [facility] o [address], [place], [place]  [address] o Phone - [phone]   Fax - [phone] . Shilpa Gosrani o [address], [place], [place]  [address] o [phone]  [place] ([address]) . [facility] o Bates, MD; [place]assfield, MD; Cooper, Cox, MD; MD; Davis, MD; Dovico, MD; Ettefaugh, MD; Little, MD; Lowe, MD; Keiffer, MD; Melvin, MD; Sumner, MD; Williams, MD o [address], [place], [place] [address] o [phone] o Mon-Fri 8:30-5:00 (extended evenings Mon-Thur as needed), Sat-Sun 10:00-1:00 o Providers come to see babies at [facility] o Accepting Medicaid for families of first-time babies and families with all children in the household age 3 and under. Must register with office prior to making appointment (M-F only). . [facility] o Henson, NP; Knapp, MD; Lalonde, MD; Tysinger, PA o [address] [place], [place] [address] o [phone] o Mon-Fri 8:00-5:00 o Babies seen by providers at [facility] o Does NOT accept Medicaid/Commercial Insurance Only . [facility] ([facility]  o Artis, MD; Barnes, MD; Bratton, MD; Coccaro, MD; Lockett Gardner, MD; Kramer, MD; Marshall, MD; Netherton, MD; Poleto, MD; Skinner, MD o [address] [place], [place] [address] o [phone] o Mon-Fri 8:30-5:30, Sat (Oct.-Mar.) 9:00-1:00 o Babies seen by providers at [facility] o Accepting Medicaid  [place] ([address]) . [facility] o Reid, MD; Warner, MD o [address], [place],  [address] o [phone] o Mon-Fri 8:30-5:00, Sat 8:30-12:00 o Providers come to see babies at [facility] o Does NOT accept Medicaid . [facility] o Becker, PA; Hagler, MD; Scifres, PA; Sun, MD; Swayne, MD o [address],  [place], [place] [address] o [phone] o Mon-Fri 8:00-5:00 o Babies seen by providers at [facility] o Does NOT accept Medicaid o Only accepting babies of parents who are patients o Please call early in hospitalization for appointment (limited availability) . [facility] o Clark, MD; Frye, MD; Kelleher, MD; Mack, NP; Miller, MD; O'Keller, MD; Patterson, NP; Pudlo, MD; Puzio, MD; Thomas, MD; Tucker, MD; Twiselton, MD o [address], [place], [place] [address] o [phone] o Mon-Fri 8:00-5:00, Sat 9:00-12:00 o Providers come to see babies at [facility] o Does NOT accept Medicaid  [place] ([address]) . [facility] o Limited providers accepting new patients: Brake, NP; Wharton, PA o [address], [place], [place] [address] o [phone] o Mon-Fri 8:00-5:00 o Babies seen by providers at [facility] o Does NOT accept Medicaid o Only accepting babies of parents who are patients o Please call early in hospitalization for appointment (limited availability) . [facility] o Gay, MD; Quinlan, MD o [address] [place], [place] [address] o [phone] (press [phone] to schedule appointment) o Mon-Fri 8:00-5:00 o Providers come to see babies at [facility] o Does NOT accept Medicaid . [facility] o Mazer, MD o [address] [place], [place] [address] o [phone] o Mon-Fri 8:30-5:00 (lunch 12:30-1:00), extended hours by appointment only Wed 5:00-6:30 o Babies seen by [facility] providers o Accepting Medicaid . [facility] o Banks, MD; Jordan, MD; Koberlein, MD o [address], [place], [place] [address] o [phone] o Mon-Fri 8:00-5:00 o Babies seen by [facility] providers o Does NOT accept Medicaid . [facility] o Parker, MD; Hunter, MD; Wallace, DO o [address] [place], [place]  [address] o [phone] o Mon-Fri 8:00-5:00 o Babies seen by [facility] providers o Does NOT accept Medicaid . [facility] o Brandon, PA; Brecken, PA; Christy, NP; Dees, MD; DeClaire, MD; DeWeese, MD; Hansen, NP; Mills, NP; Parrish, NP; Smoot, NP; Summer, MD; Vapne, MD o [address] [place], [place] [address] o [phone] o Mon-Fri 8:30-5:00, Sat 10:00-1:00 o Providers come to see babies at [facility] o Does NOT accept Medicaid o Free prenatal information session Tuesdays at 4:45pm . [facility] o Bouska, MD; Gordon, PA; Jeffery, PA; Weber, PA o [address] [place] [address] o [phone] o Mon-Fri 7:30-5:30 o Babies seen by [facility] providers . [facility] o [address], [place], [place], [place]  [address] o [phone]   Fax - [phone]  [place] ([phone]) . [facility] o Reese, MD o [address] [place], [place] [address] o [phone] o Mon-Thur 8:00-6:00 o Providers come to see babies at [facility] o Accepting Medicaid . [facility] o Anderson, NP; Badger, MD; Beal, PA; Spencer, PA o [address] [place],  [address] o [phone] o Mon-Thur 7:30-7:30, Fri 7:30-4:30 o Babies seen by [facility] providers o Accepting Medicaid . [facility] o Agbuya, MD; Klett, NP; Romgoolam, MD o [address], ,  [address] o [phone] o Mon-Fri 8:30-5:00, Sat 8:30-12:00 o Providers come to see babies at [facility] o Accepting Medicaid o Must have "Meet & Greet" appointment at office prior to delivery . [facility]  ([facility] ) o McCord,   MD; Juleen China, MD; Clydene Laming, [address], [place], [place] [address] o [phone] o Mon-Wed 8:00-6:00, Thur-Fri 8:00-5:00, Sat 9:00-12:00 o Providers come to  see babies at [facility] o Does NOT accept Medicaid o Only accepting siblings of current patients . [facility] [place], [place], [place], [place]  [address] o [phone]   Fax [phone] . [facility] [address], [place], [place]  [address] o [phone]   Fax - Morton Gorman [phone] & [phone]) . Therapist, music at [place], DO; [place], [address] [place], [place] [address] o [phone] o Mon-Fri 7:00-5:00 o Babies seen by [facility] providers o Does NOT accept Medicaid . [place], MD; [place], [place]; [place], [place], [place], [place] [address] o [phone] o Mon-Fri 8:00-5:00 o Babies seen by [facility] providers o Accepting Medicaid . Lamont, MD; [place], [place]; [place], NP; [place], [place], [place], [place] [address] o [phone] o Mon-Fri 8:00-5:00 o Babies seen by providers at [facility] [place] [phone]) . [facility] [place], [place] o [address] [place], [place] [address] o [phone] o Mon-Fri 8:00-5:00 o Babies seen by [facility] providers o Does NOT accept Medicaid o Limited availability, please call early in hospitalization to schedule follow-up . [facility] Leilani Merl, PA; Maisie Fus, MD; [place], MD; [place], [place]; Jeannine Kitten, MD; [place], [place] [facility] [address], [place], [place] [address] o [phone] o Mon-Fri 8:30-5:00, Sat 9:00-12:00 o Babies seen by providers at [facility] o Accepting Medicaid o Please register online then schedule online or call office o [email or web address] . [place] [facility]  [place]) Kristian Covey, NP; Dwyane Dee, MD; Leonidas Romberg, PA o [address] [place], [place], [place] [address] o [phone] o Mon-Fri 8:00-5:00 o Babies seen by providers at [facility] o Accepting Medicaid . [place] ([facility]) Dairl Ponder, MD; Rayvon Char, NP; Melina Modena, MD o [address] [place], [place], [place] [address] o [phone] o Mon-Fri 8:00-5:30, Sat&Sun by appointment (phones open at 8:30) o Babies seen by [facility] providers o Accepting Medicaid o Must be a first-time baby or sibling of current patient . [address], [address], [place], [place]  [address] o [phone]   Fax - [phone]  [place] [phone] & [phone]) . [place], [place]; [place], [place]; Benjamine Mola, MD; [place], [place]; Harrell Lark, MD o [address] [place], [place] [address] o [phone] o Mon-Thur 8:00-7:00, Fri 8:00-5:00, Sat 8:00-12:00, Sun 9:00-12:00 o Babies seen by [facility] providers o Accepting Medicaid . [facility], MD; Ruthann Cancer, MD; [facility], MD o [address], [place], [place] [address] o [phone] o Mon-Thur 8:00-5:00 o Babies seen by providers at [facility] o Accepting Medicaid . [facility] [place], MD; Coe-Goins, MD; Amedeo Plenty, MD; Bobby Rumpf, MD; List, MD; Lavonia Drafts, MD; Ruthann Cancer, MD; Selinda Eon, MD; Audie Box, MD; Jim Like, MD; Christie Nottingham, MD; Hubbard Hartshorn, MD; Modena Nunnery, MD o [address] [place], [place]  [address] o [phone] o Mon-Fri 8:00-5:30, Sat (Oct.-Mar.) 9:00-1:00 o Babies seen by providers at [facility] o Accepting Medicaid o Must fill out new patient packet, available online at [email or web address] . [facility] ([facility] o Friddle, NP; Harris, NP; Kelly, NP; Logan, MD; Melvin, PA;  Poth, MD; Ramadoss, MD; Stanton, NP o [address], [place], [place] [address] o [phone] o Mon-Thur 8:00-5:30, Fri 8:00-5:00 o Babies seen by providers at [facility] o Accepting Medicaid  [place] ([address]) . [facility] o Dixon, PA; La Grulla, MD; Pickard, MD; Tapia, PA o [address], [place], [place] [address] o [phone] o Mon-Fri 8:00-5:00 o Babies seen by providers at [facility] o Accepting Medicaid   [place] ([address]) . [facility] [place] o Masneri, DO; Meyers, MD; Nelson, PA o [address], [place], [place] [address] o [phone] o Mon-Fri 8:00-5:00 o Babies seen by providers at [facility] o Does NOT accept Medicaid o Limited appointment availability, please call early in hospitalization  . [facility] [place] o Kunedd, DO; McGowen, MD o [address], [place], [place] [address] o [phone] o Mon-Fri 8:00-5:00 o Babies seen by [facility] providers o Does NOT accept Medicaid . [facility] [place] o Cameron, MD; MacDonald, MD; Michaels, PA; Nayak, MD o [address] [place] [address], [place], [place] [address] o [phone] o Mon-Fri 8:00-5:00 o After hours clinic ([address] [place], [place] [address]) [phone] Mon-Fri 5:00-8:00, Sat 12:00-6:00, Sun 10:00-4:00 o Babies seen by [facility] providers o Accepting Medicaid . [facility] [place] o [address], [place], [place]  [address] o [phone]   Fax - [phone]  [place] ([address]) . [facility] [place] o Andy, MD o [address], [place], [place] [address] o [phone] o Mon-Fri 8:00-5:00 o Babies seen by [facility] providers o Does NOT accept Medicaid . [facility] [place] ([facility] [place]) o Eksir, MD o [address], [place], [place] [address] o [phone] o  Mon-Thur 8:00-7:00, Fri 8:00-5:00, Sat 8:00-12:00 o Babies seen by providers at [facility] o Accepting Medicaid - but does not have vaccinations in office (must be received elsewhere) o Limited availability, please call early in hospitalization  [place] ([address]) . [facility]  o Charlene [place]ing, MD o [address], [place] [address] o [phone]  Fax [phone]        Before Baby Comes Home Once your baby is home with you, things may become a bit hectic as you map out a schedule around your newborn's patterns. Preparing the things you need at home before that time comes is important. Before your baby arrives, make sure you:  Have all the supplies that you will need to care for your baby.  Know where to go if there is an emergency.  Discuss the baby's arrival with other family members. What supplies will I need? Having the following supplies ready before your baby arrives will help ensure that you are prepared: Large items  Crib or bassinet and mattress. Make sure to follow safe sleep recommendations to reduce the risk of sudden infant death syndrome.  Rear-facing infant car seat. Have a trained professional check to make sure that it is installed in your car correctly. Many hospitals and fire departments perform this service free of   Always make sure any products--including cribs, mattresses, bassinets, or portable cribs and play areas--are safe. Check for recalls on your specific brand and model of crib. Breastfeeding  Nursing pillow.  Milk storage containers or bags.  Nipple cream.  Nursing bra.  Breast pads.  Breast pump.  Breast shields. Feeding  Formula.  Purified bottled water.  6-8 bottles (4-5 oz bottles and 8-9 oz bottles).  6-8 bottle nipples.  Bibs and burp cloths.  Bottle brush.  Bottle sterilizer (or a pot with a lid). Bathing  Infant bath basin.  Mild baby soap  and baby shampoo.  Soft cloth towel and washcloth.  Hooded towel. Diapering  Diapers. You may need to use as many as 10-12 diapers each day.  Baby wipes.  Diaper cream.  Petroleum jelly.  Changing pad.  Hand sanitizer. Health and safety  Rectal thermometer.  Infant medicines.  Bulb syringe.  Baby nail clippers.  Baby monitor.  2-3 pacifiers, if desired. Sleeping  Sleep sack or swaddling blanket.  Firm mattress pad and fitted sheets for the crib or bassinet. Other supplies  Diaper bag.  Clothing, including one-piece outfits and pajamas.  Receiving blankets. Follow these instructions at home: Preparing for an emergency Prepare for an emergency by taking these steps:  Know when to seek care or call your health care provider.  Know how to get to the nearest hospital.  List the phone numbers of your baby's health care providers near your home phone and in your cell phone.  Take an infant first aid and CPR class.  Place the phone number for the poison control center on your refrigerator.  If there will be caregivers in the home, make sure your phone number, emergency contacts, and address are placed on the refrigerator in case they need to be given to emergency services. Preparing your family   Create a plan for visitors. Keep your baby away from people who have a cough, fever, or other symptoms of illness.  Prepare freezer meals ahead of time, and ask friends and family to help with meal preparation, errands, and everyday tasks.  If you have other children: ? Talk with them about the baby coming home. Ask them how they feel about it. ? Read a book together about being a new big brother or sister. ? Find ways to let them help you prepare for the new baby. ? Have someone ready to care for them while you are in the hospital. Where to find more information  Consumer Product Safety Commission: http://johnston-ramirez.com/  American Academy of Pediatrics:  www.healthychildren.org  Safe Kids Worldwide: www.safekids.org Summary  Planning is important before bringing your baby home from the hospital. You will need to have certain supplies ready before your baby arrives.  You will need to have a rear-facing infant car seat ready prior to bringing your baby home. Have a trained professional check to make sure that it is installed in your car correctly.  Always make sure any products--including cribs, mattresses, bassinets, or portable cribs and play areas--are safe. Check for recalls on your specific brand and model of crib.  Know when to seek care or call your health care provider, and know how to get to the nearest hospital. This information is not intended to replace advice given to you by your health care provider. Make sure you discuss any questions you have with your health care provider. Document Revised: 10/29/2017 Document Reviewed: 10/06/2017 Elsevier Patient Education  2020 [employer].

## 2020-08-13 ENCOUNTER — Other Ambulatory Visit: Payer: Self-pay

## 2020-08-13 ENCOUNTER — Other Ambulatory Visit: Payer: Self-pay | Admitting: *Deleted

## 2020-08-13 ENCOUNTER — Ambulatory Visit: Payer: Medicaid Other | Admitting: *Deleted

## 2020-08-13 ENCOUNTER — Encounter: Payer: Self-pay | Admitting: *Deleted

## 2020-08-13 ENCOUNTER — Ambulatory Visit: Payer: Medicaid Other | Attending: Obstetrics and Gynecology

## 2020-08-13 DIAGNOSIS — O210 Mild hyperemesis gravidarum: Secondary | ICD-10-CM | POA: Diagnosis present

## 2020-08-13 DIAGNOSIS — O10913 Unspecified pre-existing hypertension complicating pregnancy, third trimester: Secondary | ICD-10-CM

## 2020-08-13 DIAGNOSIS — O403XX0 Polyhydramnios, third trimester, not applicable or unspecified: Secondary | ICD-10-CM | POA: Diagnosis not present

## 2020-08-13 DIAGNOSIS — Z3A35 35 weeks gestation of pregnancy: Secondary | ICD-10-CM | POA: Diagnosis not present

## 2020-08-13 DIAGNOSIS — Z3480 Encounter for supervision of other normal pregnancy, unspecified trimester: Secondary | ICD-10-CM

## 2020-08-13 DIAGNOSIS — Z362 Encounter for other antenatal screening follow-up: Secondary | ICD-10-CM

## 2020-08-13 DIAGNOSIS — A5901 Trichomonal vulvovaginitis: Secondary | ICD-10-CM

## 2020-08-13 DIAGNOSIS — O10919 Unspecified pre-existing hypertension complicating pregnancy, unspecified trimester: Secondary | ICD-10-CM

## 2020-08-21 ENCOUNTER — Encounter: Payer: Self-pay | Admitting: *Deleted

## 2020-08-21 ENCOUNTER — Other Ambulatory Visit (HOSPITAL_COMMUNITY)
Admission: RE | Admit: 2020-08-21 | Discharge: 2020-08-21 | Disposition: A | Discharge status: Home / Self Care | Payer: Medicaid Other | Source: Ambulatory Visit | Attending: Obstetrics and Gynecology | Admitting: Obstetrics and Gynecology

## 2020-08-21 ENCOUNTER — Ambulatory Visit (INDEPENDENT_AMBULATORY_CARE_PROVIDER_SITE_OTHER): Payer: Medicaid Other | Admitting: Obstetrics and Gynecology

## 2020-08-21 ENCOUNTER — Ambulatory Visit: Payer: Medicaid Other | Admitting: *Deleted

## 2020-08-21 ENCOUNTER — Ambulatory Visit: Payer: Medicaid Other | Attending: Obstetrics

## 2020-08-21 ENCOUNTER — Other Ambulatory Visit: Payer: Self-pay

## 2020-08-21 ENCOUNTER — Encounter: Payer: Self-pay | Admitting: Obstetrics and Gynecology

## 2020-08-21 VITALS — BP 113/77 | HR 116 | Temp 98.0°F | Wt 206.6 lb

## 2020-08-21 DIAGNOSIS — A5901 Trichomonal vulvovaginitis: Secondary | ICD-10-CM | POA: Diagnosis present

## 2020-08-21 DIAGNOSIS — O10913 Unspecified pre-existing hypertension complicating pregnancy, third trimester: Secondary | ICD-10-CM | POA: Diagnosis not present

## 2020-08-21 DIAGNOSIS — O10013 Pre-existing essential hypertension complicating pregnancy, third trimester: Secondary | ICD-10-CM

## 2020-08-21 DIAGNOSIS — Z362 Encounter for other antenatal screening follow-up: Secondary | ICD-10-CM | POA: Diagnosis not present

## 2020-08-21 DIAGNOSIS — O210 Mild hyperemesis gravidarum: Secondary | ICD-10-CM | POA: Insufficient documentation

## 2020-08-21 DIAGNOSIS — Z3480 Encounter for supervision of other normal pregnancy, unspecified trimester: Secondary | ICD-10-CM | POA: Insufficient documentation

## 2020-08-21 DIAGNOSIS — O10919 Unspecified pre-existing hypertension complicating pregnancy, unspecified trimester: Secondary | ICD-10-CM

## 2020-08-21 DIAGNOSIS — O403XX0 Polyhydramnios, third trimester, not applicable or unspecified: Secondary | ICD-10-CM

## 2020-08-21 DIAGNOSIS — N898 Other specified noninflammatory disorders of vagina: Secondary | ICD-10-CM | POA: Insufficient documentation

## 2020-08-21 DIAGNOSIS — O26899 Other specified pregnancy related conditions, unspecified trimester: Secondary | ICD-10-CM

## 2020-08-21 DIAGNOSIS — Z3A36 36 weeks gestation of pregnancy: Secondary | ICD-10-CM

## 2020-08-21 NOTE — Progress Notes (Signed)
  HIGH-RISK PREGNANCY OFFICE VISIT Patient name: Jackie Newman MRN [id number]  Date of birth: 08-Nov-1991 Chief Complaint:   Routine Prenatal Visit  History of Present Illness:   Jackie Newman is a 29 y.o. G88P0010 female at [redacted]w[redacted]d with an Estimated Date of Delivery: 09/16/20 being seen today for ongoing management of a high-risk pregnancy complicated by [facility] currently on Labetalol 200 mg BID and PTL (s/p BMZ x 2 doses 8/26 & 8/27). Today she reports white vaginal dishcarge with no odor, itching or burning. Contractions: Irregular. Vag. Bleeding: None.  Movement: Present. denies leaking of fluid.  Review of Systems:   Pertinent items are noted in HPI Denies abnormal vaginal discharge w/ itching/odor/irritation, headaches, visual changes, shortness of breath, chest pain, abdominal pain, severe nausea/vomiting, or problems with urination or bowel movements unless otherwise stated above. Pertinent History Reviewed:  Reviewed past medical,surgical, social, obstetrical and family history.  Reviewed problem list, medications and allergies. Physical Assessment:   Vitals:   08/21/20 0836  BP: 113/77  Pulse: (!) 116  Temp: 98 F (36.7 C)  Weight: 206 lb 9.6 oz (93.7 kg)  Body mass index is 34.38 kg/m.           Physical Examination:   General appearance: alert, well appearing, and in no distress and oriented to person, place, and time  Mental status: alert, oriented to person, place, and time, normal mood, behavior, speech, dress, motor activity, and thought processes  Skin: warm & dry   Extremities: Edema: None    Cardiovascular: normal heart rate noted  Respiratory: normal respiratory effort, no distress  Abdomen: gravid, soft, non-tender  Pelvic: Cervical exam performed  Dilation: 1 Effacement (%): 50 Station: Ballotable  Fetal Status: Fetal Heart Rate (bpm): 147 Fundal Height: 38 cm Movement: Present Presentation: Vertex  Fetal Surveillance Testing today: none   No results found  for this or any previous visit (from the past 24 hour(s)).  Assessment & Plan:  1) High-risk pregnancy G2P0010 at [redacted]w[redacted]d with an Estimated Date of Delivery: 09/16/20   2) Supervision of other normal pregnancy, antepartum  - Discussed IOL to be scheduled at 39 wks d/t cHTN on meds  3) Vaginal discharge during pregnancy, antepartum  - Cervicovaginal ancillary only( [place])  4) Chronic hypertension affecting pregnancy - Taking Labetalol and bASA as prescribed  - IOL @ 39 weeks   5) [redacted] weeks gestation of pregnancy   Meds: No orders of the defined types were placed in this encounter.   Labs/procedures today: cervical exam  Treatment Plan:  IOL @ 39 weeks  Reviewed: Preterm labor symptoms and general obstetric precautions including but not limited to vaginal bleeding, contractions, leaking of fluid and fetal movement were reviewed in detail with the patient.  All questions were answered. Has home bp cuff. Check bp weekly, let us know if >140/90.   Follow-up: Return in about 1 week (around 08/28/2020) for Return OB visit.  No orders of the defined types were placed in this encounter.  Raelyn Mora MSN, CNM 08/21/2020 8:52 AM

## 2020-08-21 NOTE — Patient Instructions (Signed)

## 2020-08-22 ENCOUNTER — Other Ambulatory Visit: Payer: Self-pay | Admitting: Obstetrics and Gynecology

## 2020-08-22 LAB — CERVICOVAGINAL ANCILLARY ONLY
Bacterial Vaginitis (gardnerella): NEGATIVE
Candida Glabrata: NEGATIVE
Candida Vaginitis: POSITIVE — AB
Chlamydia: NEGATIVE
Comment: NEGATIVE
Comment: NEGATIVE
Comment: NEGATIVE
Comment: NEGATIVE
Comment: NEGATIVE
Comment: NORMAL
Neisseria Gonorrhea: NEGATIVE
Trichomonas: NEGATIVE

## 2020-08-22 NOTE — Progress Notes (Signed)
IOL already entered

## 2020-08-23 ENCOUNTER — Telehealth: Payer: Self-pay | Admitting: *Deleted

## 2020-08-23 DIAGNOSIS — B3731 Acute candidiasis of vulva and vagina: Secondary | ICD-10-CM

## 2020-08-23 MED ORDER — TERCONAZOLE 0.4 % VA CREA: 1.0000 | TOPICAL_CREAM | Freq: Every day | VAGINAL | 0 refills | Status: DC

## 2020-08-23 NOTE — Telephone Encounter (Signed)
-----   Message from Raelyn Mora, [place] sent at 08/23/2020  6:01 AM EDT ----- Please treat for yeast

## 2020-08-27 ENCOUNTER — Other Ambulatory Visit: Payer: Self-pay

## 2020-08-27 ENCOUNTER — Ambulatory Visit: Payer: Medicaid Other | Attending: Obstetrics

## 2020-08-27 ENCOUNTER — Ambulatory Visit: Payer: Medicaid Other | Admitting: *Deleted

## 2020-08-27 ENCOUNTER — Other Ambulatory Visit: Payer: Self-pay | Admitting: *Deleted

## 2020-08-27 ENCOUNTER — Encounter: Payer: Self-pay | Admitting: *Deleted

## 2020-08-27 DIAGNOSIS — O10919 Unspecified pre-existing hypertension complicating pregnancy, unspecified trimester: Secondary | ICD-10-CM | POA: Diagnosis not present

## 2020-08-27 DIAGNOSIS — O10913 Unspecified pre-existing hypertension complicating pregnancy, third trimester: Secondary | ICD-10-CM

## 2020-08-27 DIAGNOSIS — Z362 Encounter for other antenatal screening follow-up: Secondary | ICD-10-CM

## 2020-08-27 DIAGNOSIS — Z3480 Encounter for supervision of other normal pregnancy, unspecified trimester: Secondary | ICD-10-CM | POA: Insufficient documentation

## 2020-08-27 DIAGNOSIS — O403XX0 Polyhydramnios, third trimester, not applicable or unspecified: Secondary | ICD-10-CM | POA: Diagnosis not present

## 2020-08-27 DIAGNOSIS — O210 Mild hyperemesis gravidarum: Secondary | ICD-10-CM | POA: Insufficient documentation

## 2020-08-27 DIAGNOSIS — Z3A37 37 weeks gestation of pregnancy: Secondary | ICD-10-CM | POA: Diagnosis not present

## 2020-08-27 DIAGNOSIS — A5901 Trichomonal vulvovaginitis: Secondary | ICD-10-CM | POA: Diagnosis present

## 2020-08-29 ENCOUNTER — Encounter: Payer: Self-pay | Admitting: Medical

## 2020-08-29 ENCOUNTER — Other Ambulatory Visit: Payer: Self-pay

## 2020-08-29 ENCOUNTER — Ambulatory Visit (INDEPENDENT_AMBULATORY_CARE_PROVIDER_SITE_OTHER): Payer: Medicaid Other | Admitting: Medical

## 2020-08-29 VITALS — BP 124/84 | HR 111 | Temp 98.1°F | Wt 205.4 lb

## 2020-08-29 DIAGNOSIS — B951 Streptococcus, group B, as the cause of diseases classified elsewhere: Secondary | ICD-10-CM

## 2020-08-29 DIAGNOSIS — R87612 Low grade squamous intraepithelial lesion on cytologic smear of cervix (LGSIL): Secondary | ICD-10-CM

## 2020-08-29 DIAGNOSIS — Z3480 Encounter for supervision of other normal pregnancy, unspecified trimester: Secondary | ICD-10-CM

## 2020-08-29 DIAGNOSIS — O409XX0 Polyhydramnios, unspecified trimester, not applicable or unspecified: Secondary | ICD-10-CM

## 2020-08-29 DIAGNOSIS — O10919 Unspecified pre-existing hypertension complicating pregnancy, unspecified trimester: Secondary | ICD-10-CM

## 2020-08-29 NOTE — Patient Instructions (Signed)
Fetal Movement Counts Patient Name: ________________________________________________ Patient Due Date: ____________________ What is a fetal movement count?  A fetal movement count is the number of times that you feel your baby move during a certain amount of time. This may also be called a fetal kick count. A fetal movement count is recommended for every pregnant woman. You may be asked to start counting fetal movements as early as week 28 of your pregnancy. Pay attention to when your baby is most active. You may notice your baby's sleep and wake cycles. You may also notice things that make your baby move more. You should do a fetal movement count:  When your baby is normally most active.  At the same time each day. A good time to count movements is while you are resting, after having something to eat and drink. How do I count fetal movements? 1. Find a quiet, comfortable area. Sit, or lie down on your side. 2. Write down the date, the start time and stop time, and the number of movements that you felt between those two times. Take this information with you to your health care visits. 3. Write down your start time when you feel the first movement. 4. Count kicks, flutters, swishes, rolls, and jabs. You should feel at least 10 movements. 5. You may stop counting after you have felt 10 movements, or if you have been counting for 2 hours. Write down the stop time. 6. If you do not feel 10 movements in 2 hours, contact your health care provider for further instructions. Your health care provider may want to do additional tests to assess your baby's well-being. Contact a health care provider if:  You feel fewer than 10 movements in 2 hours.  Your baby is not moving like he or she usually does. Date: ____________ Start time: ____________ Stop time: ____________ Movements: ____________ Date: ____________ Start time: ____________ Stop time: ____________ Movements: ____________ Date: ____________  Start time: ____________ Stop time: ____________ Movements: ____________ Date: ____________ Start time: ____________ Stop time: ____________ Movements: ____________ Date: ____________ Start time: ____________ Stop time: ____________ Movements: ____________ Date: ____________ Start time: ____________ Stop time: ____________ Movements: ____________ Date: ____________ Start time: ____________ Stop time: ____________ Movements: ____________ Date: ____________ Start time: ____________ Stop time: ____________ Movements: ____________ Date: ____________ Start time: ____________ Stop time: ____________ Movements: ____________ This information is not intended to replace advice given to you by your health care provider. Make sure you discuss any questions you have with your health care provider. Document Revised: 07/06/2019 Document Reviewed: 07/06/2019 Elsevier Patient Education  2020 [employer] Braxton Hicks Contractions Contractions of the uterus can occur throughout pregnancy, but they are not always a sign that you are in labor. You may have practice contractions called Braxton Hicks contractions. These false labor contractions are sometimes confused with true labor. What are Braxton Hicks contractions? Braxton Hicks contractions are tightening movements that occur in the muscles of the uterus before labor. Unlike true labor contractions, these contractions do not result in opening (dilation) and thinning of the cervix. Toward the end of pregnancy (32-34 weeks), Braxton Hicks contractions can happen more often and may become stronger. These contractions are sometimes difficult to tell apart from true labor because they can be very uncomfortable. You should not feel embarrassed if you go to the hospital with false labor. Sometimes, the only way to tell if you are in true labor is for your health care provider to look for changes in the cervix. The health care provider   will do a physical exam and may  monitor your contractions. If you are not in true labor, the exam should show that your cervix is not dilating and your water has not broken. If there are no other health problems associated with your pregnancy, it is completely safe for you to be sent home with false labor. You may continue to have Braxton Hicks contractions until you go into true labor. How to tell the difference between true labor and false labor True labor  Contractions last 30-70 seconds.  Contractions become very regular.  Discomfort is usually felt in the top of the uterus, and it spreads to the lower abdomen and low back.  Contractions do not go away with walking.  Contractions usually become more intense and increase in frequency.  The cervix dilates and gets thinner. False labor  Contractions are usually shorter and not as strong as true labor contractions.  Contractions are usually irregular.  Contractions are often felt in the front of the lower abdomen and in the groin.  Contractions may go away when you walk around or change positions while lying down.  Contractions get weaker and are shorter-lasting as time goes on.  The cervix usually does not dilate or become thin. Follow these instructions at home:   Take over-the-counter and prescription medicines only as told by your health care provider.  Keep up with your usual exercises and follow other instructions from your health care provider.  Eat and drink lightly if you think you are going into labor.  If Braxton Hicks contractions are making you uncomfortable: ? Change your position from lying down or resting to walking, or change from walking to resting. ? Sit and rest in a tub of warm water. ? Drink enough fluid to keep your urine pale yellow. Dehydration may cause these contractions. ? Do slow and deep breathing several times an hour.  Keep all follow-up prenatal visits as told by your health care provider. This is important. Contact a  health care provider if:  You have a fever.  You have continuous pain in your abdomen. Get help right away if:  Your contractions become stronger, more regular, and closer together.  You have fluid leaking or gushing from your vagina.  You pass blood-tinged mucus (bloody show).  You have bleeding from your vagina.  You have low back pain that you never had before.  You feel your baby's head pushing down and causing pelvic pressure.  Your baby is not moving inside you as much as it used to. Summary  Contractions that occur before labor are called Braxton Hicks contractions, false labor, or practice contractions.  Braxton Hicks contractions are usually shorter, weaker, farther apart, and less regular than true labor contractions. True labor contractions usually become progressively stronger and regular, and they become more frequent.  Manage discomfort from Braxton Hicks contractions by changing position, resting in a warm bath, drinking plenty of water, or practicing deep breathing. This information is not intended to replace advice given to you by your health care provider. Make sure you discuss any questions you have with your health care provider. Document Revised: 10/29/2017 Document Reviewed: 04/01/2017 Elsevier Patient Education  2020 [employer] Inc[employer]  

## 2020-08-29 NOTE — Progress Notes (Signed)
   PRENATAL VISIT NOTE  Subjective:  Jackie Newman is a 29 y.o. G2P0010 at [redacted]w[redacted]d being seen today for ongoing prenatal care.  She is currently monitored for the following issues for this high-risk pregnancy and has Morning sickness; Supervision of other normal pregnancy, antepartum; Trichomonas vaginalis (TV) infection; Chronic hypertension affecting pregnancy; LGSIL on Pap smear of cervix; Preterm labor; Elevated amylase; Elevated AST (SGOT); Polyhydramnios affecting pregnancy; and Positive GBS test on their problem list.  Patient reports backache.  Contractions: Irritability. Vag. Bleeding: None.  Movement: Present. Denies leaking of fluid.   The following portions of the patient's history were reviewed and updated as appropriate: allergies, current medications, past family history, past medical history, past social history, past surgical history and problem list.   Objective:   Vitals:   08/29/20 0932  BP: 124/84  Pulse: (!) 111  Temp: 98.1 F (36.7 C)  Weight: 205 lb 6.4 oz (93.2 kg)    Fetal Status: Fetal Heart Rate (bpm): 150 Fundal Height: 40 cm Movement: Present     General:  Alert, oriented and cooperative. Patient is in no acute distress.  Skin: Skin is warm and dry. No rash noted.   Cardiovascular: Normal heart rate noted  Respiratory: Normal respiratory effort, no problems with respiration noted  Abdomen: Soft, gravid, appropriate for gestational age.  Pain/Pressure: Absent     Pelvic: Cervical exam deferred        Extremities: Normal range of motion.  Edema: None  Mental Status: Normal mood and affect. Normal behavior. Normal judgment and thought content.   Assessment and Plan:  Pregnancy: G2P0010 at [redacted]w[redacted]d 1. Supervision of other normal pregnancy, antepartum - Doing well - Backache at night   2. Chronic hypertension affecting pregnancy - Normotensive today   3. Positive GBS test - Treat in labor  4. LGSIL on Pap smear of cervix - Needs Colpo PP   5.  Polyhydramnios affecting pregnancy - Resolved on last [place]  Term labor symptoms and general obstetric precautions including but not limited to vaginal bleeding, contractions, leaking of fluid and fetal movement were reviewed in detail with the patient. Please refer to After Visit Summary for other counseling recommendations.   Return in about 1 week (around 09/05/2020) for [facility].  Future Appointments  Date Time Provider Department Center  09/03/2020  7:15 AM [facility] NURSE [facility]  09/03/2020  7:30 AM [facility] US2 [facility]  09/05/2020 10:50 AM Raelyn Mora, CNM [facility] None  09/09/2020  7:00 AM [facility] SCHED ROOM [facility] None  09/12/2020 10:10 AM Raelyn Mora, CNM [facility] None    Vonzella Nipple, PA-C

## 2020-09-02 ENCOUNTER — Encounter (HOSPITAL_COMMUNITY): Payer: Self-pay

## 2020-09-03 ENCOUNTER — Encounter (HOSPITAL_COMMUNITY): Payer: Self-pay | Admitting: *Deleted

## 2020-09-03 ENCOUNTER — Ambulatory Visit: Payer: Medicaid Other | Attending: Maternal & Fetal Medicine

## 2020-09-03 ENCOUNTER — Other Ambulatory Visit: Payer: Self-pay | Admitting: Advanced Practice Midwife

## 2020-09-03 ENCOUNTER — Other Ambulatory Visit: Payer: Self-pay

## 2020-09-03 ENCOUNTER — Ambulatory Visit: Payer: Medicaid Other | Admitting: *Deleted

## 2020-09-03 ENCOUNTER — Encounter: Payer: Self-pay | Admitting: *Deleted

## 2020-09-03 ENCOUNTER — Telehealth (HOSPITAL_COMMUNITY): Payer: Self-pay | Admitting: *Deleted

## 2020-09-03 DIAGNOSIS — O10913 Unspecified pre-existing hypertension complicating pregnancy, third trimester: Secondary | ICD-10-CM | POA: Diagnosis not present

## 2020-09-03 DIAGNOSIS — A5901 Trichomonal vulvovaginitis: Secondary | ICD-10-CM | POA: Diagnosis present

## 2020-09-03 DIAGNOSIS — O210 Mild hyperemesis gravidarum: Secondary | ICD-10-CM

## 2020-09-03 DIAGNOSIS — O10919 Unspecified pre-existing hypertension complicating pregnancy, unspecified trimester: Secondary | ICD-10-CM | POA: Insufficient documentation

## 2020-09-03 DIAGNOSIS — Z3480 Encounter for supervision of other normal pregnancy, unspecified trimester: Secondary | ICD-10-CM

## 2020-09-03 DIAGNOSIS — Z3A38 38 weeks gestation of pregnancy: Secondary | ICD-10-CM

## 2020-09-03 DIAGNOSIS — O403XX0 Polyhydramnios, third trimester, not applicable or unspecified: Secondary | ICD-10-CM

## 2020-09-03 NOTE — Telephone Encounter (Signed)
Preadmission screen  

## 2020-09-05 ENCOUNTER — Ambulatory Visit (INDEPENDENT_AMBULATORY_CARE_PROVIDER_SITE_OTHER): Payer: Medicaid Other | Admitting: Obstetrics and Gynecology

## 2020-09-05 ENCOUNTER — Inpatient Hospital Stay (HOSPITAL_COMMUNITY)
Admission: AD | Admit: 2020-09-05 | Discharge: 2020-09-05 | Disposition: A | Discharge status: Home / Self Care | Payer: Medicaid Other | Attending: Obstetrics & Gynecology | Admitting: Obstetrics & Gynecology

## 2020-09-05 ENCOUNTER — Encounter (HOSPITAL_COMMUNITY): Payer: Self-pay | Admitting: Obstetrics & Gynecology

## 2020-09-05 ENCOUNTER — Other Ambulatory Visit: Payer: Self-pay

## 2020-09-05 VITALS — BP 124/85 | HR 88 | Temp 98.1°F | Wt 214.0 lb

## 2020-09-05 DIAGNOSIS — O471 False labor at or after 37 completed weeks of gestation: Secondary | ICD-10-CM | POA: Diagnosis present

## 2020-09-05 DIAGNOSIS — B951 Streptococcus, group B, as the cause of diseases classified elsewhere: Secondary | ICD-10-CM

## 2020-09-05 DIAGNOSIS — Z3A38 38 weeks gestation of pregnancy: Secondary | ICD-10-CM

## 2020-09-05 DIAGNOSIS — O210 Mild hyperemesis gravidarum: Secondary | ICD-10-CM | POA: Diagnosis not present

## 2020-09-05 DIAGNOSIS — A5901 Trichomonal vulvovaginitis: Secondary | ICD-10-CM

## 2020-09-05 DIAGNOSIS — Z3480 Encounter for supervision of other normal pregnancy, unspecified trimester: Secondary | ICD-10-CM

## 2020-09-05 DIAGNOSIS — O0993 Supervision of high risk pregnancy, unspecified, third trimester: Secondary | ICD-10-CM

## 2020-09-05 DIAGNOSIS — O10919 Unspecified pre-existing hypertension complicating pregnancy, unspecified trimester: Secondary | ICD-10-CM

## 2020-09-05 DIAGNOSIS — O479 False labor, unspecified: Secondary | ICD-10-CM | POA: Diagnosis not present

## 2020-09-05 NOTE — MAU Note (Signed)
Ctxs since 1850. Denies VB and no LOF but urinating a lot. Had office visit today but no sve

## 2020-09-05 NOTE — Progress Notes (Signed)
   LOW-RISK PREGNANCY OFFICE VISIT Patient name: Jackie Newman MRN [id number]  Date of birth: [birth year] Chief Complaint:   Routine Prenatal Visit  History of Present Illness:   Jackie Newman is a 29 y.o. G22P0010 female at [redacted]w[redacted]d with an Estimated Date of Delivery: 09/16/20 being seen today for ongoing management of a low-risk pregnancy.  Today she reports occasional contractions. Contractions: Irregular. Vag. Bleeding: None.  Movement: Present. denies leaking of fluid. Review of Systems:   Pertinent items are noted in HPI Denies abnormal vaginal discharge w/ itching/odor/irritation, headaches, visual changes, shortness of breath, chest pain, abdominal pain, severe nausea/vomiting, or problems with urination or bowel movements unless otherwise stated above. Pertinent History Reviewed:  Reviewed past medical,surgical, social, obstetrical and family history.  Reviewed problem list, medications and allergies. Physical Assessment:   Vitals:   09/05/20 1051  BP: 124/85  Pulse: 88  Temp: 98.1 F (36.7 C)  Weight: 214 lb (97.1 kg)  Body mass index is 35.61 kg/m.        Physical Examination:   General appearance: Well appearing, and in no distress  Mental status: Alert, oriented to person, place, and time  Skin: Warm & dry  Cardiovascular: Normal heart rate noted  Respiratory: Normal respiratory effort, no distress  Abdomen: Soft, gravid, nontender  Pelvic: Cervical exam deferred         Extremities: Edema: None  Fetal Status: Fetal Heart Rate (bpm): 136 Fundal Height: 43 cm Movement: Present Presentation: Vertex  No results found for this or any previous visit (from the past 24 hour(s)).  Assessment & Plan:  1) Low-risk pregnancy G2P0010 at [redacted]w[redacted]d with an Estimated Date of Delivery: 09/16/20   2) Supervision of high risk pregnancy in third trimester - IOL scheduled for 10/11/201 AM. Patient worried she will not have enough gas to drive to the location in [place], [place] for her  COVID-19 testing.  - Advised if not able to secure transportation to COVID-19 testing location, to let L&D staff know and they will perform a Rapid COVID-19 test on the day of her induction.  3) Chronic hypertension affecting pregnancy - Taking Labetalol 200 mg BID - Taking bASA daily  4) Positive GBS test - IP abx  5) [redacted] weeks gestation of pregnancy    Meds: No orders of the defined types were placed in this encounter.  Labs/procedures today: none  Plan:  Continue routine obstetrical care   Reviewed: Term labor symptoms and general obstetric precautions including but not limited to vaginal bleeding, contractions, leaking of fluid and fetal movement were reviewed in detail with the patient.  All questions were answered. Has home bp cuff. Check bp weekly, let us know if >140/90.   Follow-up: No follow-ups on file.  No orders of the defined types were placed in this encounter.  Raelyn Mora MSN, CNM 09/05/2020

## 2020-09-05 NOTE — Discharge Instructions (Signed)
Braxton Hicks Contractions °Contractions of the uterus can occur throughout pregnancy, but they are not always a sign that you are in labor. You may have practice contractions called Braxton Hicks contractions. These false labor contractions are sometimes confused with true labor. °What are Braxton Hicks contractions? °Braxton Hicks contractions are tightening movements that occur in the muscles of the uterus before labor. Unlike true labor contractions, these contractions do not result in opening (dilation) and thinning of the cervix. Toward the end of pregnancy (32-34 weeks), Braxton Hicks contractions can happen more often and may become stronger. These contractions are sometimes difficult to tell apart from true labor because they can be very uncomfortable. You should not feel embarrassed if you go to the hospital with false labor. °Sometimes, the only way to tell if you are in true labor is for your health care provider to look for changes in the cervix. The health care provider will do a physical exam and may monitor your contractions. If you are not in true labor, the exam should show that your cervix is not dilating and your water has not broken. °If there are no other health problems associated with your pregnancy, it is completely safe for you to be sent home with false labor. You may continue to have Braxton Hicks contractions until you go into true labor. °How to tell the difference between true labor and false labor °True labor °· Contractions last 30-70 seconds. °· Contractions become very regular. °· Discomfort is usually felt in the top of the uterus, and it spreads to the lower abdomen and low back. °· Contractions do not go away with walking. °· Contractions usually become more intense and increase in frequency. °· The cervix dilates and gets thinner. °False labor °· Contractions are usually shorter and not as strong as true labor contractions. °· Contractions are usually irregular. °· Contractions  are often felt in the front of the lower abdomen and in the groin. °· Contractions may go away when you walk around or change positions while lying down. °· Contractions get weaker and are shorter-lasting as time goes on. °· The cervix usually does not dilate or become thin. °Follow these instructions at home: ° °· Take over-the-counter and prescription medicines only as told by your health care provider. °· Keep up with your usual exercises and follow other instructions from your health care provider. °· Eat and drink lightly if you think you are going into labor. °· If Braxton Hicks contractions are making you uncomfortable: °? Change your position from lying down or resting to walking, or change from walking to resting. °? Sit and rest in a tub of warm water. °? Drink enough fluid to keep your urine pale yellow. Dehydration may cause these contractions. °? Do slow and deep breathing several times an hour. °· Keep all follow-up prenatal visits as told by your health care provider. This is important. °Contact a health care provider if: °· You have a fever. °· You have continuous pain in your abdomen. °Get help right away if: °· Your contractions become stronger, more regular, and closer together. °· You have fluid leaking or gushing from your vagina. °· You pass blood-tinged mucus (bloody show). °· You have bleeding from your vagina. °· You have low back pain that you never had before. °· You feel your baby’s head pushing down and causing pelvic pressure. °· Your baby is not moving inside you as much as it used to. °Summary °· Contractions that occur before labor are   called Braxton Hicks contractions, false labor, or practice contractions. °· Braxton Hicks contractions are usually shorter, weaker, farther apart, and less regular than true labor contractions. True labor contractions usually become progressively stronger and regular, and they become more frequent. °· Manage discomfort from Braxton Hicks contractions  by changing position, resting in a warm bath, drinking plenty of water, or practicing deep breathing. °This information is not intended to replace advice given to you by your health care provider. Make sure you discuss any questions you have with your health care provider. °Document Revised: 10/29/2017 Document Reviewed: 04/01/2017 °Elsevier Patient Education © 2020 [employer] Inc. ° °

## 2020-09-05 NOTE — MAU Provider Note (Addendum)
S: [name] is a 29 y.o. G2P0010 at [redacted]w[redacted]d  who presents to MAU today for labor evaluation.     Cervical exam by RN:  Dilation: 1 Effacement (%): 50 Cervical Position: Posterior Station: -3 Presentation: Vertex Exam by:: Zenia Resides, RN  Fetal Monitoring: Baseline: 150 Variability: moderate Accelerations: present Decelerations: absent Contractions: irreg, q2-5 mins  MDM Discussed patient with RN. NST reviewed.   A: SIUP at [redacted]w[redacted]d  False labor  P: Discharge home Labor precautions and kick counts included in AVS Patient to follow-up with OBGYN as scheduled  Patient may return to MAU as needed or when in labor   Glade Lloyd, MD PGY1 09/05/2020 9:41 PM     Cathie Beams, CNM 09/05/2020 10:35 PM

## 2020-09-07 ENCOUNTER — Other Ambulatory Visit (HOSPITAL_COMMUNITY)
Admission: RE | Admit: 2020-09-07 | Discharge: 2020-09-07 | Disposition: A | Discharge status: Home / Self Care | Payer: Medicaid Other | Source: Ambulatory Visit | Attending: Family Medicine | Admitting: Family Medicine

## 2020-09-07 DIAGNOSIS — Z20822 Contact with and (suspected) exposure to covid-19: Secondary | ICD-10-CM | POA: Insufficient documentation

## 2020-09-07 DIAGNOSIS — Z01812 Encounter for preprocedural laboratory examination: Secondary | ICD-10-CM | POA: Diagnosis present

## 2020-09-07 LAB — SARS CORONAVIRUS 2 (TAT 6-24 HRS): SARS Coronavirus 2: NEGATIVE

## 2020-09-09 ENCOUNTER — Inpatient Hospital Stay (HOSPITAL_COMMUNITY)
Admission: AD | Admit: 2020-09-09 | Discharge: 2020-09-12 | DRG: 807 | Disposition: A | Discharge status: Home / Self Care | Payer: Medicaid Other | Attending: Family Medicine | Admitting: Family Medicine

## 2020-09-09 ENCOUNTER — Inpatient Hospital Stay (HOSPITAL_COMMUNITY): Payer: Medicaid Other

## 2020-09-09 ENCOUNTER — Encounter (HOSPITAL_COMMUNITY): Payer: Self-pay | Admitting: Obstetrics and Gynecology

## 2020-09-09 DIAGNOSIS — Z3480 Encounter for supervision of other normal pregnancy, unspecified trimester: Secondary | ICD-10-CM

## 2020-09-09 DIAGNOSIS — O1002 Pre-existing essential hypertension complicating childbirth: Secondary | ICD-10-CM | POA: Diagnosis present

## 2020-09-09 DIAGNOSIS — Z3A39 39 weeks gestation of pregnancy: Secondary | ICD-10-CM | POA: Diagnosis not present

## 2020-09-09 DIAGNOSIS — J45909 Unspecified asthma, uncomplicated: Secondary | ICD-10-CM | POA: Diagnosis present

## 2020-09-09 DIAGNOSIS — O99824 Streptococcus B carrier state complicating childbirth: Secondary | ICD-10-CM | POA: Diagnosis present

## 2020-09-09 DIAGNOSIS — O9952 Diseases of the respiratory system complicating childbirth: Secondary | ICD-10-CM | POA: Diagnosis present

## 2020-09-09 DIAGNOSIS — O10919 Unspecified pre-existing hypertension complicating pregnancy, unspecified trimester: Secondary | ICD-10-CM | POA: Diagnosis present

## 2020-09-09 DIAGNOSIS — A5901 Trichomonal vulvovaginitis: Secondary | ICD-10-CM | POA: Diagnosis present

## 2020-09-09 DIAGNOSIS — Z7722 Contact with and (suspected) exposure to environmental tobacco smoke (acute) (chronic): Secondary | ICD-10-CM | POA: Diagnosis present

## 2020-09-09 DIAGNOSIS — R87612 Low grade squamous intraepithelial lesion on cytologic smear of cervix (LGSIL): Secondary | ICD-10-CM | POA: Diagnosis present

## 2020-09-09 DIAGNOSIS — O210 Mild hyperemesis gravidarum: Secondary | ICD-10-CM

## 2020-09-09 DIAGNOSIS — B951 Streptococcus, group B, as the cause of diseases classified elsewhere: Secondary | ICD-10-CM | POA: Diagnosis not present

## 2020-09-09 DIAGNOSIS — Z3490 Encounter for supervision of normal pregnancy, unspecified, unspecified trimester: Secondary | ICD-10-CM | POA: Diagnosis present

## 2020-09-09 LAB — CBC
HCT: 36 % (ref 36.0–46.0)
Hemoglobin: 11.8 g/dL — ABNORMAL LOW (ref 12.0–15.0)
MCH: 30.4 pg (ref 26.0–34.0)
MCHC: 32.8 g/dL (ref 30.0–36.0)
MCV: 92.8 fL (ref 80.0–100.0)
Platelets: 213 10*3/uL (ref 150–400)
RBC: 3.88 MIL/uL (ref 3.87–5.11)
RDW: 14.6 % (ref 11.5–15.5)
WBC: 9.5 10*3/uL (ref 4.0–10.5)
nRBC: 0 % (ref 0.0–0.2)

## 2020-09-09 LAB — TYPE AND SCREEN
ABO/RH(D): O POS
Antibody Screen: NEGATIVE

## 2020-09-09 LAB — PROTEIN / CREATININE RATIO, URINE
Creatinine, Urine: 135.05 mg/dL
Protein Creatinine Ratio: 0.07 mg/mg{Cre} (ref 0.00–0.15)
Total Protein, Urine: 10 mg/dL

## 2020-09-09 LAB — RPR: RPR Ser Ql: NONREACTIVE

## 2020-09-09 MED ORDER — OXYTOCIN-SODIUM CHLORIDE 30-0.9 UT/500ML-% IV SOLN: 2.5000 [IU]/h | INTRAVENOUS | Status: DC

## 2020-09-09 MED ORDER — LIDOCAINE HCL (PF) 1 % IJ SOLN: 30.0000 mL | INTRAMUSCULAR | Status: DC | PRN

## 2020-09-09 MED ORDER — LABETALOL HCL 200 MG PO TABS
200.0000 mg | ORAL_TABLET | Freq: Two times a day (BID) | ORAL | Status: DC
  Administered 2020-09-09 – 2020-09-10 (×2): 200 mg via ORAL
  Filled 2020-09-09 (×2): qty 1

## 2020-09-09 MED ORDER — TERBUTALINE SULFATE 1 MG/ML IJ SOLN: 0.2500 mg | Freq: Once | INTRAMUSCULAR | Status: DC | PRN

## 2020-09-09 MED ORDER — OXYTOCIN-SODIUM CHLORIDE 30-0.9 UT/500ML-% IV SOLN
1.0000 m[IU]/min | INTRAVENOUS | Status: DC
  Administered 2020-09-09: 4 m[IU]/min via INTRAVENOUS
  Administered 2020-09-09: 2 m[IU]/min via INTRAVENOUS
  Filled 2020-09-09: qty 500

## 2020-09-09 MED ORDER — OXYTOCIN BOLUS FROM INFUSION: 333.0000 mL | Freq: Once | INTRAVENOUS | Status: DC

## 2020-09-09 MED ORDER — LACTATED RINGERS IV SOLN: 500.0000 mL | INTRAVENOUS | Status: DC | PRN

## 2020-09-09 MED ORDER — OXYCODONE-ACETAMINOPHEN 5-325 MG PO TABS: 2.0000 | ORAL_TABLET | ORAL | Status: DC | PRN

## 2020-09-09 MED ORDER — ONDANSETRON HCL 4 MG/2ML IJ SOLN
4.0000 mg | Freq: Four times a day (QID) | INTRAMUSCULAR | Status: DC | PRN
  Administered 2020-09-09 – 2020-09-10 (×2): 4 mg via INTRAVENOUS
  Filled 2020-09-09 (×2): qty 2

## 2020-09-09 MED ORDER — TERBUTALINE SULFATE 1 MG/ML IJ SOLN
0.2500 mg | Freq: Once | INTRAMUSCULAR | Status: AC | PRN
  Administered 2020-09-10: 0.25 mg via SUBCUTANEOUS
  Filled 2020-09-09: qty 1

## 2020-09-09 MED ORDER — PENICILLIN G POT IN DEXTROSE 60000 UNIT/ML IV SOLN: 3.0000 10*6.[IU] | INTRAVENOUS | Status: DC

## 2020-09-09 MED ORDER — PENICILLIN G POT IN DEXTROSE 60000 UNIT/ML IV SOLN
3.0000 10*6.[IU] | INTRAVENOUS | Status: DC
  Administered 2020-09-09 – 2020-09-10 (×2): 3 10*6.[IU] via INTRAVENOUS
  Filled 2020-09-09 (×2): qty 50

## 2020-09-09 MED ORDER — SODIUM CHLORIDE 0.9 % IV SOLN
5.0000 10*6.[IU] | Freq: Once | INTRAVENOUS | Status: AC
  Administered 2020-09-09: 5 10*6.[IU] via INTRAVENOUS
  Filled 2020-09-09: qty 5

## 2020-09-09 MED ORDER — LACTATED RINGERS IV SOLN: INTRAVENOUS | Status: DC

## 2020-09-09 MED ORDER — OXYCODONE-ACETAMINOPHEN 5-325 MG PO TABS: 1.0000 | ORAL_TABLET | ORAL | Status: DC | PRN

## 2020-09-09 MED ORDER — ONDANSETRON HCL 4 MG/2ML IJ SOLN: 4.0000 mg | Freq: Four times a day (QID) | INTRAMUSCULAR | Status: DC | PRN

## 2020-09-09 MED ORDER — FENTANYL CITRATE (PF) 100 MCG/2ML IJ SOLN
50.0000 ug | INTRAMUSCULAR | Status: DC | PRN
  Administered 2020-09-10 (×2): 50 ug via INTRAVENOUS
  Administered 2020-09-10: 100 ug via INTRAVENOUS
  Filled 2020-09-09 (×3): qty 2

## 2020-09-09 MED ORDER — ALBUTEROL SULFATE HFA 108 (90 BASE) MCG/ACT IN AERS
1.0000 | INHALATION_SPRAY | Freq: Four times a day (QID) | RESPIRATORY_TRACT | Status: DC | PRN
  Filled 2020-09-09: qty 6.7

## 2020-09-09 MED ORDER — SOD CITRATE-CITRIC ACID 500-334 MG/5ML PO SOLN: 30.0000 mL | ORAL | Status: DC | PRN

## 2020-09-09 MED ORDER — SODIUM CHLORIDE 0.9 % IV SOLN: 5.0000 10*6.[IU] | Freq: Once | INTRAVENOUS | Status: DC

## 2020-09-09 MED ORDER — MISOPROSTOL 50MCG HALF TABLET: 50.0000 ug | ORAL_TABLET | ORAL | Status: DC | PRN

## 2020-09-09 MED ORDER — OXYTOCIN BOLUS FROM INFUSION
333.0000 mL | Freq: Once | INTRAVENOUS | Status: AC
  Administered 2020-09-10: 333 mL via INTRAVENOUS

## 2020-09-09 MED ORDER — ALBUTEROL SULFATE (2.5 MG/3ML) 0.083% IN NEBU: 2.5000 mg | INHALATION_SOLUTION | Freq: Four times a day (QID) | RESPIRATORY_TRACT | Status: DC | PRN

## 2020-09-09 MED ORDER — ACETAMINOPHEN 325 MG PO TABS: 650.0000 mg | ORAL_TABLET | ORAL | Status: DC | PRN

## 2020-09-09 MED ORDER — MISOPROSTOL 25 MCG QUARTER TABLET
25.0000 ug | ORAL_TABLET | ORAL | Status: DC | PRN
  Administered 2020-09-09: 25 ug via VAGINAL
  Filled 2020-09-09: qty 1

## 2020-09-09 NOTE — H&P (Addendum)
OBSTETRIC ADMISSION HISTORY AND PHYSICAL  Jackie Newman is a 29 y.o. female G2P0010 with IUP at [redacted]w[redacted]d by LMP presenting for IOL for cHTN. She reports +FMs, No LOF, no VB, no blurry vision, headaches or peripheral edema, and RUQ pain.  She plans on breast and bottle feeding. She declines birth control. 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 Asthma    last used inhaler a month ago  . Congenital umbilical hernia   . Hypertension   . Ovarian cyst   . Seizures (HCC)    No follow up. Patient stated she had one seizure during her 23st birthday drinking alcohol.    Past Surgical History: Past Surgical History:  Procedure Laterality Date  . COLONOSCOPY WITH ESOPHAGOGASTRODUODENOSCOPY (EGD)  12/04/2019    Obstetrical History: OB History    Gravida  2   Para      Term      Preterm      AB  1   Living  0     SAB  1   TAB      Ectopic      Multiple      Live Births              Social History Social History   Socioeconomic History  . Marital status: Single    Spouse name: Not on file  . Number of children: Not on file  . Years of education: Not on file  . Highest education level: High school graduate  Occupational History  . Not on file  Tobacco Use  . Smoking status: Passive Smoke Exposure - Never Smoker  . Smokeless tobacco: Never Used  Vaping Use  . Vaping Use: Never used  Substance and Sexual Activity  . Alcohol use: No    Comment: occasional socially  . Drug use: Not Currently    Types: Marijuana    Comment: quit 3-4 weeks ago  . Sexual activity: Not Currently    Birth control/protection: None    Comment: last  ilntercourse 6 weeks ago  Other Topics Concern  . Not on file  Social History Narrative  . Not on file   Social Determinants of Health   Financial Resource Strain: Low Risk   . Difficulty of Paying Living Expenses: Not hard at all  Food Insecurity: No Food Insecurity  . Worried About 02/01/2020 in the Last Year: Never true  . Ran Out of Food in the Last Year: Never true  Transportation Needs: No Transportation Needs  . Lack of Transportation (Medical): No  . Lack of Transportation (Non-Medical): No  Physical Activity:   . Days of Exercise per Week: Not on file  . Minutes of Exercise per Session: Not on file  Stress:   . Feeling of Stress : Not on file  Social Connections:   . Frequency of Communication with Friends and Family: Not on file  . Frequency of Social Gatherings with Friends and Family: Not on file  . Attends Religious Services: Not on file  . Active Member of Clubs or Organizations: Not on file  . Attends Programme researcher, broadcasting/film/video Meetings: Not on file  . Marital Status: Not  on file    Family History: Family History  Problem Relation Age of Onset  . Asthma Father   . Gout Father     Allergies: Allergies  Allergen Reactions  . Doxycycline Nausea And Vomiting    Medications Prior to Admission  Medication Sig Dispense Refill Last Dose  . albuterol (VENTOLIN HFA) 108 (90 Base) MCG/ACT inhaler Inhale 1-2 puffs into the lungs every 6 (six) hours as needed for wheezing or shortness of breath. 18 g 3 UNK  . aspirin 81 MG chewable tablet Chew 1 tablet (81 mg total) by mouth daily. 30 tablet 6 09/09/2020 at Unknown time  . labetalol (NORMODYNE) 200 MG tablet TAKE 1 TABLET BY MOUTH TWICE A DAY (Patient taking differently: Take 200 mg by mouth 2 (two) times daily. ) 180 tablet 1 09/09/2020 at 0700  . Prenatal Vit-Fe Fumarate-FA (PRENATAL MULTIVITAMIN) TABS tablet Take 1 tablet by mouth daily at 12 noon.   09/09/2020 at Unknown time  . promethazine (PHENERGAN) 25 MG  tablet Take 1 tablet (25 mg total) by mouth every 6 (six) hours as needed for nausea or vomiting. 30 tablet 1 09/09/2020 at Unknown time  . Blood Pressure Monitoring (BLOOD PRESSURE MONITOR AUTOMAT) DEVI 1 Device by Does not apply route daily. Automatic blood pressure cuff regular size. To monitor blood pressure regularly at home. ICD-10 code: O60.90 (Patient not taking: Reported on 07/11/2020) 1 each 0 Not Taking at Unknown time  . butalbital-acetaminophen-caffeine (FIORICET) 50-325-40 MG tablet Take 1-2 tablets by mouth every 6 (six) hours as needed for headache. (Patient not taking: Reported on 05/30/2020) 6 tablet 0 Not Taking at Unknown time  . Elastic Bandages & Supports (WRIST SPLINT/COCK-UP/LEFT L) MISC 1 Device by Does not apply route 3 times/day as needed-between meals & bedtime. (Patient not taking: Reported on 09/09/2020) 1 each 0 Not Taking at Unknown time  . Elastic Bandages & Supports (WRIST SPLINT/COCK-UP/RIGHT L) MISC 1 Device by Does not apply route as needed. (Patient not taking: Reported on 09/09/2020) 1 each 0 Not Taking at Unknown time  . fluticasone (FLONASE) 50 MCG/ACT nasal spray Place 2 sprays into both nostrils daily for 6 days. (Patient not taking: Reported on 09/09/2020) 11.1 mL 0 Not Taking at Unknown time  . Misc. Devices (GOJJI WEIGHT SCALE) MISC 1 Device by Does not apply route daily as needed. To weight self daily as needed at home. ICD-10 code: O22.90 (Patient not taking: Reported on 09/09/2020) 1 each 0 Not Taking at Unknown time  . terconazole (TERAZOL 7) 0.4 % vaginal cream Place 1 applicator vaginally at bedtime. (Patient not taking: Reported on 09/05/2020) 45 g 0 Not Taking at Unknown time     Review of Systems   All systems reviewed and negative except as stated in HPI  Blood pressure 123/83, pulse 88, temperature 98.6 F (37 C), temperature source Oral, resp. rate 18, last menstrual period 12/11/2019. General appearance: alert, cooperative and appears stated  age Lungs: breathing comfortably Pelvic: see below Extremities: Homans sign is negative, no sign of DVT Presentation: cephalic  Via US Fetal monitoringBaseline: 140 bpm, Variability: Good {> 6 bpm), Accelerations: Reactive and Decelerations: Absent Uterine activity: present but irreg (q1-6 mins) Dilation: Closed Effacement (%): 50 Station: Ballotable Exam by:: Cher Nakai RNC   Prenatal labs: ABO, Rh: --/--/O POS (10/11 0949) Antibody: NEG (10/11 0949) Rubella: 1.69 (04/07 1541) RPR: Non Reactive (07/12 0849)  HBsAg: Negative (04/07 1541)  HIV: Non Reactive (07/12 0849)  GBS: Positive/-- (08/28 0000)  2 hr  Glucola: 83/171/117 Genetic screening: AFP neg Anatomy US: normal  Prenatal Transfer Tool  Maternal Diabetes: No Genetic Screening: Normal Maternal Ultrasounds/Referrals: Normal Fetal Ultrasounds or other Referrals:  None Maternal Substance Abuse:  No Significant Maternal Medications:  None Significant Maternal Lab Results: Group B Strep positive  Results for orders placed or performed during the hospital encounter of 09/09/20 (from the past 24 hour(s))  CBC   Collection Time: 09/09/20  9:49 AM  Result Value Ref Range   WBC 9.5 4.0 - 10.5 K/uL   RBC 3.88 3.87 - 5.11 MIL/uL   Hemoglobin 11.8 (L) 12.0 - 15.0 g/dL   HCT 47.0 36 - 46 %   MCV 92.8 80.0 - 100.0 fL   MCH 30.4 26.0 - 34.0 pg   MCHC 32.8 30.0 - 36.0 g/dL   RDW 96.2 83.6 - 62.9 %   Platelets 213 150 - 400 K/uL   nRBC 0.0 0.0 - 0.2 %  Type and screen [facility]   Collection Time: 09/09/20  9:49 AM  Result Value Ref Range   ABO/RH(D) O POS    Antibody Screen NEG    Sample Expiration      09/12/2020,2359 Performed at [facility], [address] [place], [place] [address]     Patient Active Problem List   Diagnosis Date Noted  . Encounter for induction of labor 09/09/2020  . Supervision of high risk pregnancy in third trimester 09/05/2020  . Positive GBS test 07/28/2020   . Preterm labor 07/25/2020  . Elevated amylase 07/25/2020  . Elevated AST (SGOT) 07/25/2020  . Polyhydramnios affecting pregnancy 07/25/2020  . LGSIL on Pap smear of cervix 05/02/2020  . Chronic hypertension affecting pregnancy 04/04/2020  . Trichomonas vaginalis (TV) infection 03/07/2020  . Supervision of other normal pregnancy, antepartum 02/13/2020  . Morning sickness 01/22/2020    Assessment/Plan:  Ja Pistole is a 29 y.o. G2P0010 at [redacted]w[redacted]d here for IOL for cHTN.  #Induction of Labor: Plan to start cytotec then attempt FB. #cHTN: continue labetalol 200 mg BID, UA and CMP pending, normotensive #Pain: per request #FWB:  cat 1 #ID: GBS postive #MOF: both #MOC: declines #Circ: NA  Herby Abraham MD, PGY-1  I saw and evaluated the patient. I agree with the findings and the plan of care as documented in the resident's note.  Casper Harrison, MD [facility] Family Medicine Fellow, [facility] for [facility], [facility]

## 2020-09-09 NOTE — Progress Notes (Signed)
Patient refused lab to draw CMP. Educated patient on the reasons for lab test. Patient says she will possibly do it later.  Changed lab time to 20:00

## 2020-09-09 NOTE — Progress Notes (Signed)
LABOR PROGRESS NOTE  Jackie Newman is a 29 y.o. G2P0010 at [redacted]w[redacted]d admitted for IOL for cHTN  Subjective: Reports painful contractions and nausea.  Objective: BP 125/76   Pulse 84   Temp 98.4 F (36.9 C) (Oral)   Resp 18   LMP 12/11/2019   Dilation: 2 Effacement (%): 50 Cervical Position: Posterior Station: Ballotable Presentation: Vertex Exam by:: Dr Dorice Lamas Fetal monitoring: Baseline: 150 bpm, Variability: Good {> 6 bpm), Accelerations: Reactive, and Decelerations: few lates Uterine activity: q2-6 mins  Labs: Lab Results  Component Value Date   WBC 9.5 09/09/2020   HGB 11.8 (L) 09/09/2020   HCT 36.0 09/09/2020   MCV 92.8 09/09/2020   PLT 213 09/09/2020    Patient Active Problem List   Diagnosis Date Noted  . Encounter for induction of labor 09/09/2020  . Supervision of high risk pregnancy in third trimester 09/05/2020  . Positive GBS test 07/28/2020  . Preterm labor 07/25/2020  . Elevated amylase 07/25/2020  . Elevated AST (SGOT) 07/25/2020  . Polyhydramnios affecting pregnancy 07/25/2020  . LGSIL on Pap smear of cervix 05/02/2020  . Chronic hypertension affecting pregnancy 04/04/2020  . Trichomonas vaginalis (TV) infection 03/07/2020  . Supervision of other normal pregnancy, antepartum 02/13/2020  . Morning sickness 01/22/2020    Assessment / Plan: IOL for cHTN  #Labor: Progressing normally Cytotec x1 started @ 1132. S/p FB 1600. #cHTN: normotensive #Fetal Wellbeing:  Category II, due to few lates that were associated with position of patient on back for FB placement #Pain Control: per request #ID: GBS pos (PCN) #Anticipated MOD: vaginal  Herby Abraham MD, PGY-1 Family Medicine Resident, [facility] Faculty Teaching Service  09/09/2020, 5:45 PM

## 2020-09-09 NOTE — Progress Notes (Signed)
LABOR PROGRESS NOTE  Jackie Newman is a 29 y.o. G2P0010 at [redacted]w[redacted]d admitted for IOL for cHTN.  Subjective: Pt reports doing well without concerns.  Objective: BP 136/86   Pulse 73   Temp 98.6 F (37 C) (Oral)   Resp 16   LMP 12/11/2019   Dilation: 4.5 Effacement (%): 80 Cervical Position: Posterior Station: -2, -3 Presentation: Vertex Exam by:: Chief Technology Officer Fetal monitoring: Baseline: 140 bpm, Variability: Good {> 6 bpm), Accelerations: Reactive, and Decelerations: none Uterine activity: q2-4 mins  Labs: Lab Results  Component Value Date   WBC 9.5 09/09/2020   HGB 11.8 (L) 09/09/2020   HCT 36.0 09/09/2020   MCV 92.8 09/09/2020   PLT 213 09/09/2020    Patient Active Problem List   Diagnosis Date Noted  . Encounter for induction of labor 09/09/2020  . Supervision of high risk pregnancy in third trimester 09/05/2020  . Positive GBS test 07/28/2020  . Preterm labor 07/25/2020  . Elevated amylase 07/25/2020  . Elevated AST (SGOT) 07/25/2020  . Polyhydramnios affecting pregnancy 07/25/2020  . LGSIL on Pap smear of cervix 05/02/2020  . Chronic hypertension affecting pregnancy 04/04/2020  . Trichomonas vaginalis (TV) infection 03/07/2020  . Supervision of other normal pregnancy, antepartum 02/13/2020  . Morning sickness 01/22/2020    Assessment / Plan: IOL for cHTN  #Labor: Progressing normally Cytotec x1 started @ 1132. S/p FB 1600. Pitocin started at 2115. Will continue up-titration of pitocin if good fetal tolerance #cHTN: normotensive. Preeclampsia labs wnl on admission. #Fetal Wellbeing:  Category 1 strip, +FM #Pain Control: per request #ID: GBS pos; PCN started on admission (1800 on 09/09/20) #Anticipated MOD: vaginal  Eufelia Veno, Skipper Cliche, MD OB Fellow, Faculty Practice 09/09/2020 10:38 PM

## 2020-09-10 ENCOUNTER — Other Ambulatory Visit: Payer: Self-pay

## 2020-09-10 ENCOUNTER — Encounter (HOSPITAL_COMMUNITY): Payer: Self-pay | Admitting: Obstetrics and Gynecology

## 2020-09-10 DIAGNOSIS — Z3A39 39 weeks gestation of pregnancy: Secondary | ICD-10-CM

## 2020-09-10 DIAGNOSIS — O1002 Pre-existing essential hypertension complicating childbirth: Secondary | ICD-10-CM

## 2020-09-10 DIAGNOSIS — O99824 Streptococcus B carrier state complicating childbirth: Secondary | ICD-10-CM

## 2020-09-10 DIAGNOSIS — B951 Streptococcus, group B, as the cause of diseases classified elsewhere: Secondary | ICD-10-CM

## 2020-09-10 MED ORDER — ONDANSETRON HCL 4 MG/2ML IJ SOLN: 4.0000 mg | INTRAMUSCULAR | Status: DC | PRN

## 2020-09-10 MED ORDER — ACETAMINOPHEN 325 MG PO TABS
650.0000 mg | ORAL_TABLET | Freq: Four times a day (QID) | ORAL | Status: DC
  Administered 2020-09-10 – 2020-09-12 (×7): 650 mg via ORAL
  Filled 2020-09-10 (×7): qty 2

## 2020-09-10 MED ORDER — SIMETHICONE 80 MG PO CHEW: 80.0000 mg | CHEWABLE_TABLET | ORAL | Status: DC | PRN

## 2020-09-10 MED ORDER — BENZOCAINE-MENTHOL 20-0.5 % EX AERO
1.0000 "application " | INHALATION_SPRAY | CUTANEOUS | Status: DC | PRN
  Administered 2020-09-11: 1 via TOPICAL
  Filled 2020-09-10: qty 56

## 2020-09-10 MED ORDER — PRENATAL MULTIVITAMIN CH
1.0000 | ORAL_TABLET | Freq: Every day | ORAL | Status: DC
  Filled 2020-09-10: qty 1

## 2020-09-10 MED ORDER — DIPHENHYDRAMINE HCL 25 MG PO CAPS: 25.0000 mg | ORAL_CAPSULE | Freq: Four times a day (QID) | ORAL | Status: DC | PRN

## 2020-09-10 MED ORDER — IBUPROFEN 800 MG PO TABS
800.0000 mg | ORAL_TABLET | Freq: Three times a day (TID) | ORAL | Status: DC
  Administered 2020-09-10 – 2020-09-12 (×7): 800 mg via ORAL
  Filled 2020-09-10 (×7): qty 1

## 2020-09-10 MED ORDER — ACETAMINOPHEN 500 MG PO TABS: 1000.0000 mg | ORAL_TABLET | Freq: Once | ORAL | Status: DC

## 2020-09-10 MED ORDER — SENNOSIDES-DOCUSATE SODIUM 8.6-50 MG PO TABS
2.0000 | ORAL_TABLET | ORAL | Status: DC
  Administered 2020-09-11 – 2020-09-12 (×2): 2 via ORAL
  Filled 2020-09-10 (×2): qty 2

## 2020-09-10 MED ORDER — TETANUS-DIPHTH-ACELL PERTUSSIS 5-2.5-18.5 LF-MCG/0.5 IM SUSP: 0.5000 mL | Freq: Once | INTRAMUSCULAR | Status: DC

## 2020-09-10 MED ORDER — COCONUT OIL OIL
1.0000 "application " | TOPICAL_OIL | Status: DC | PRN
  Administered 2020-09-11: 1 via TOPICAL

## 2020-09-10 MED ORDER — LIDOCAINE HCL (PF) 1 % IJ SOLN
INTRAMUSCULAR | Status: AC
  Administered 2020-09-10: 30 mL via SUBCUTANEOUS
  Filled 2020-09-10: qty 30

## 2020-09-10 MED ORDER — WITCH HAZEL-GLYCERIN EX PADS: 1.0000 "application " | MEDICATED_PAD | CUTANEOUS | Status: DC | PRN

## 2020-09-10 MED ORDER — DIBUCAINE (PERIANAL) 1 % EX OINT: 1.0000 "application " | TOPICAL_OINTMENT | CUTANEOUS | Status: DC | PRN

## 2020-09-10 MED ORDER — ONDANSETRON HCL 4 MG PO TABS: 4.0000 mg | ORAL_TABLET | ORAL | Status: DC | PRN

## 2020-09-10 NOTE — Discharge Instructions (Signed)

## 2020-09-10 NOTE — Discharge Summary (Signed)
Postpartum Discharge Summary      Patient Name: Jackie Newman DOB: 1991-01-26 MRN: [id number]  Date of admission: 09/09/2020 Delivery date:09/10/2020  Delivering provider: Randa Ngo  Date of discharge: 09/12/2020  Admitting diagnosis: Encounter for induction of labor [Z34.90] Intrauterine pregnancy: [redacted]w[redacted]d    Secondary diagnosis:  Principal Problem:   Encounter for induction of labor Active Problems:   Trichomonas vaginalis (TV) infection   Chronic hypertension affecting pregnancy   LGSIL on Pap smear of cervix   Vaginal delivery  Additional problems: as noted above    Discharge diagnosis: Vaginal delivery                                      Post partum procedures:none Augmentation: AROM, Pitocin, Cytotec and IP Foley Complications: None  Hospital course: Induction of Labor With Vaginal Delivery   29y.o. yo G2P1011 at 358w1das admitted to the hospital 09/09/2020 for induction of labor.  Indication for induction: cHTN.  Patient had an uncomplicated labor course as follows: Membrane Rupture Time/Date: 1:24 AM ,09/10/2020   Delivery Method:Vaginal, Spontaneous  Episiotomy: None  Lacerations:  2nd degree  Details of delivery can be found in separate delivery note.  Patient had a routine postpartum course. She was started on norvasc 50m22mn 10/13. Patient is discharged home 09/12/20.  Newborn Data: Birth date:09/10/2020  Birth time:5:22 AM  Gender:Female  Living status:Living  Apgars:8 ,9  Weight:3311 g   Magnesium Sulfate received: No BMZ received: No Rhophylac:N/A MMR:N/A T-DaP:Given postpartum Flu: No Transfusion:No  Physical exam  Vitals:   09/11/20 0506 09/11/20 1440 09/11/20 1949 09/12/20 0540  BP: 129/88 133/75 134/89 129/74  Pulse: 69 78 81 95  Resp: '18 18 16 15  ' Temp: 98.2 F (36.8 C) 98.5 F (36.9 C) 98.3 F (36.8 C) 98.4 F (36.9 C)  TempSrc:  Oral Oral Oral  SpO2: 100% 100% 100% 100%  Weight:      Height:       General: alert,  cooperative and no distress Lochia: appropriate Uterine Fundus: firm Incision: N/A DVT Evaluation: No evidence of DVT seen on physical exam. Labs: Lab Results  Component Value Date   WBC 9.5 09/09/2020   HGB 11.8 (L) 09/09/2020   HCT 36.0 09/09/2020   MCV 92.8 09/09/2020   PLT 213 09/09/2020   CMP Latest Ref Rng & Units 07/26/2020  Glucose 70 - 99 mg/dL 92  BUN 6 - 20 mg/dL <5(L)  Creatinine 0.44 - 1.00 mg/dL 0.65  Sodium 135 - 145 mmol/L 136  Potassium 3.5 - 5.1 mmol/L 3.9  Chloride 98 - 111 mmol/L 105  CO2 22 - 32 mmol/L 19(L)  Calcium 8.9 - 10.3 mg/dL 9.0  Total Protein 6.5 - 8.1 g/dL 6.0(L)  Total Bilirubin 0.3 - 1.2 mg/dL 0.7  Alkaline Phos 38 - 126 U/L 77  AST 15 - 41 U/L 26  ALT 0 - 44 U/L 31   Edinburgh Score: Edinburgh Postnatal Depression Scale Screening Tool 09/10/2020  I have been able to laugh and see the funny side of things. 0  I have looked forward with enjoyment to things. 0  I have blamed myself unnecessarily when things went wrong. 2  I have been anxious or worried for no good reason. 0  I have felt scared or panicky for no good reason. 0  Things have been getting on top of me. 0  I have  been so unhappy that I have had difficulty sleeping. 0  I have felt sad or miserable. 0  I have been so unhappy that I have been crying. 0  The thought of harming myself has occurred to me. 0  Edinburgh Postnatal Depression Scale Total 2     After visit meds:  Allergies as of 09/12/2020      Reactions   Doxycycline Nausea And Vomiting      Medication List    STOP taking these medications   aspirin 81 MG chewable tablet   Blood Pressure Monitor Automat Devi   butalbital-acetaminophen-caffeine 50-325-40 MG tablet Commonly known as: FIORICET   Gojji Weight Scale Misc   labetalol 200 MG tablet Commonly known as: NORMODYNE   promethazine 25 MG tablet Commonly known as: PHENERGAN   terconazole 0.4 % vaginal cream Commonly known as: TERAZOL 7   Wrist  Splint/Cock-Up/Left L Misc   Wrist Splint/Cock-Up/Right L Misc     TAKE these medications   acetaminophen 325 MG tablet Commonly known as: Tylenol Take 2 tablets (650 mg total) by mouth every 6 (six) hours as needed.   albuterol 108 (90 Base) MCG/ACT inhaler Commonly known as: VENTOLIN HFA Inhale 1-2 puffs into the lungs every 6 (six) hours as needed for wheezing or shortness of breath.   amLODipine 5 MG tablet Commonly known as: NORVASC Take 1 tablet (5 mg total) by mouth daily.   coconut oil Oil Apply 1 application topically as needed (nipple pain).   fluticasone 50 MCG/ACT nasal spray Commonly known as: FLONASE Place 2 sprays into both nostrils daily for 6 days.   ibuprofen 800 MG tablet Commonly known as: ADVIL Take 1 tablet (800 mg total) by mouth every 8 (eight) hours as needed.   prenatal multivitamin Tabs tablet Take 1 tablet by mouth daily at 12 noon.        Discharge home in stable condition Infant Feeding: breast and bottle Infant Disposition:home with mother Discharge instruction: per After Visit Summary and Postpartum booklet. Activity: Advance as tolerated. Pelvic rest for 6 weeks.  Diet: routine diet Future Appointments: Future Appointments  Date Time Provider [place]  09/17/2020 10:10 AM [facility] NURSE [facility] None  10/23/2020 10:10 AM Laury Deep, CNM [facility] None   Follow up Visit: Message sent to [facility] clinic to schedule PP appt and BP check in 1 week  Please schedule this patient for a In person postpartum visit in 4 weeks with the following provider: Any provider. Additional Postpartum F/U:BP check 1 week  High risk pregnancy complicated by: cHTN Delivery mode:  Vaginal, Spontaneous  Anticipated Birth Control:  declines  09/12/2020 Janet Berlin, MD

## 2020-09-10 NOTE — Progress Notes (Addendum)
LABOR PROGRESS NOTE  Ernestine Rohman is a 29 y.o. G2P0010 at [redacted]w[redacted]d admitted for IOL for cHTN.  Subjective: Pt reports doing well without concerns. Desires AROM at this time.  Objective: BP 136/86   Pulse 73   Temp 98.6 F (37 C) (Oral)   Resp 16   LMP 12/11/2019   Dilation: 6 Effacement (%): 70 Cervical Position: Posterior Station: -1, -2 Presentation: Vertex Exam by:: Dr Barb Merino Fetal monitoring: Baseline: 130 bpm, Variability: Good {> 6 bpm), Accelerations: Reactive, and Decelerations: 1 deep decel that resolved s/p maternal repositioning Uterine activity: q1-4 mins  Labs: Lab Results  Component Value Date   WBC 9.5 09/09/2020   HGB 11.8 (L) 09/09/2020   HCT 36.0 09/09/2020   MCV 92.8 09/09/2020   PLT 213 09/09/2020    Patient Active Problem List   Diagnosis Date Noted  . Encounter for induction of labor 09/09/2020  . Supervision of high risk pregnancy in third trimester 09/05/2020  . Positive GBS test 07/28/2020  . Preterm labor 07/25/2020  . Elevated amylase 07/25/2020  . Elevated AST (SGOT) 07/25/2020  . Polyhydramnios affecting pregnancy 07/25/2020  . LGSIL on Pap smear of cervix 05/02/2020  . Chronic hypertension affecting pregnancy 04/04/2020  . Trichomonas vaginalis (TV) infection 03/07/2020  . Supervision of other normal pregnancy, antepartum 02/13/2020  . Morning sickness 01/22/2020    Assessment / Plan: Rosio Weiss is a 29 y.o. G2P0010 at [redacted]w[redacted]d admitted for IOL for cHTN.  #Labor: Progressing normally Cytotec x1 started @ 1132. S/p FB 1600. Pitocin started at 2115. S/p AROM for light meconium-stained fluid at 0125. Will plan to recheck cervical exam in 2 hours or sooner as clinically indicated. #cHTN: Blood pressures continue to be in normal range. Preeclampsia labs wnl on admission. #Fetal Wellbeing:  Category 2 strip secondary to 1 deep decel but reassuringly good recovery s/p maternal repositioning, good variability and +accels. #Pain  Control: pt plans on IV fentanyl but does not desire epidural #ID: GBS pos; PCN started on admission (1800 on 09/09/20) #Anticipated MOD: vaginal  Theron Cumbie, Skipper Cliche, MD OB Fellow, Faculty Practice 09/10/2020 1:29 AM

## 2020-09-11 MED ORDER — LABETALOL HCL 200 MG PO TABS: 200.0000 mg | ORAL_TABLET | Freq: Two times a day (BID) | ORAL | Status: DC

## 2020-09-11 MED ORDER — AMLODIPINE BESYLATE 5 MG PO TABS
5.0000 mg | ORAL_TABLET | Freq: Every day | ORAL | Status: DC
  Administered 2020-09-11: 5 mg via ORAL
  Filled 2020-09-11: qty 1

## 2020-09-11 NOTE — Social Work (Signed)
CSW received consult for hx of marijuana use.  Referral was screened out due to the following:  ~ MOB had no documented substance use after initial prenatal visit/+UPT. ~ MOB had no positive drug screens after initial prenatal visit/+UPT. ~ Baby's UDS is negative.  Please consult CSW if current concerns arise or by MOB's request.  CSW will monitor CDS results and make report to Child Protective Services if warranted.  Vallie Teters, LCSWA Clinical Social Worker [facility] [phone] 

## 2020-09-11 NOTE — Lactation Note (Signed)
This note was copied from a baby's chart. Lactation Consultation Note  Patient Name: Jackie Newman [id number] Date: 09/11/2020 Reason for consult: Initial assessment;Term  Initial visit with 36 hours old infant, 3.84% weight loss. Infant is receiving phototherapy while in mother's arms. Mother states her feeding goal is to breast and formula feed. Last feeding infant got 47 mL of formula. Mother explains she is paced bottle feeding. Mother reports she is putting infant to breast but milk has not come into volume yet. Infant has had 3 voids and 1 stool in the last 24 hours.   Reviewed with mother average size of a NB stomach and formula supplementation guidelines.  Encourage to follow babies' hunger and fullness cues. Reviewed importance to offer the breast 8 to 12 times in a 24-hour period for proper stimulation and to establish good milk supply. Reviewed signs of good milk transfer. Discussed milk coming to volume. Promoted maternal rest, hydration and food intake. Reviewed newborn behavior and expectations with mother and encouraged to contact [facility] for support, questions or concerns.    All questions answered at this time.    Maternal Data Formula Feeding for Exclusion: No Has patient been taught Hand Expression?: Yes  Feeding Feeding Type: Formula Nipple Type: Slow - flow  Interventions Interventions: Breast feeding basics reviewed;Hand pump  Lactation Tools Discussed/Used WIC Program: Yes  Consult Status Consult Status: Follow-up Date: 09/12/20 Follow-up type: In-patient    Madason Rauls A Higuera Ancidey 09/11/2020, 6:13 PM

## 2020-09-11 NOTE — Progress Notes (Addendum)
POSTPARTUM PROGRESS NOTE  Subjective: Jackie Newman is a 29 y.o. G2P1011 s/p SVD at [redacted]w[redacted]d.  She reports she doing well. No acute events overnight. She denies any problems with ambulating, voiding or po intake. Denies nausea or vomiting. She has passed flatus. Pain is well controlled.  Lochia is moderate, "soaking 3-4 pads" in a day.  Objective: Blood pressure 129/88, pulse 69, temperature 98.2 F (36.8 C), resp. rate 18, height 5\' 5"  (1.651 m), weight 97.1 kg, last menstrual period 12/11/2019, SpO2 100 %, unknown if currently breastfeeding.  Physical Exam:  General: alert, cooperative and no distress Chest: no respiratory distress Abdomen: soft, non-tender  Uterine Fundus: firm and at level of umbilicus Extremities: No calf swelling or tenderness   Recent Labs    09/09/20 0949  HGB 11.8*  HCT 36.0    Assessment/Plan: Jackie Newman is a 29 y.o. G2P1011 s/p SVD at [redacted]w[redacted]d after IOL for cHTN.  Routine Postpartum Care: Doing well, pain well-controlled.  -- Continue routine care, lactation support  -- Contraception: declines -- Feeding: breast and bottle  #cHTN: Labetalol 200 BID in pregnancy. Blood pressures all normal range while inpatient. Will continue to monitor closely and have low threshold to start amlodipine 5mg  daily prior to discharge pending Bps throughout the day.  Dispo: Plan for discharge tomorrow 10/14 (PPD #2).  , MD 09/11/2020 7:47 AM  Attestation of Supervision of Student:  I confirm that I have verified the information documented in the  resident's  note and that I have also personally reperformed the history, physical exam and all medical decision making activities.  I have verified that all services and findings are accurately documented in this student's note; and I agree with management and plan as outlined in the documentation. I have also made any necessary editorial changes.  Fayette Pho, MD [facility] 09/11/2020 10:59 AM

## 2020-09-12 ENCOUNTER — Other Ambulatory Visit (HOSPITAL_COMMUNITY): Payer: Self-pay | Admitting: Obstetrics and Gynecology

## 2020-09-12 ENCOUNTER — Encounter: Payer: Medicaid Other | Admitting: Obstetrics and Gynecology

## 2020-09-12 MED ORDER — IBUPROFEN 800 MG PO TABS
800.0000 mg | ORAL_TABLET | Freq: Three times a day (TID) | ORAL | 0 refills | Status: DC | PRN
Start: 2020-09-12 — End: 2020-12-11

## 2020-09-12 MED ORDER — AMLODIPINE BESYLATE 5 MG PO TABS
5.0000 mg | ORAL_TABLET | Freq: Every day | ORAL | 0 refills | Status: DC
Start: 2020-09-12 — End: 2020-09-18

## 2020-09-12 MED ORDER — ACETAMINOPHEN 325 MG PO TABS: 650.0000 mg | ORAL_TABLET | Freq: Four times a day (QID) | ORAL | Status: DC | PRN

## 2020-09-12 MED ORDER — COCONUT OIL OIL: 1.0000 "application " | TOPICAL_OIL | 0 refills | Status: DC | PRN

## 2020-09-12 MED FILL — AMLODIPINE BESYLATE 5 MG TA: 5 | 30 days supply | Qty: 30 | Fill #0

## 2020-09-12 MED FILL — IBUPROFEN 800 MG TAB: 800 | 10 days supply | Qty: 30 | Fill #0

## 2020-09-12 NOTE — Lactation Note (Signed)
This note was copied from a baby's chart. Lactation Consultation Note  Patient Name: Jackie Newman [id number] Date: 09/12/2020 Reason for consult: Follow-up assessment  P1 mother whose infant is now 24 hours old.  This is a term baby at 39+1 weeks.  Per MD notes, baby has had some persistent tachypnea.  An echocardiogram was completed and mother is awaiting the results.  She is packed and ready for discharge.  Grandmother present.    Mother has been primarily bottle feeding.  She had no questions/concerns related to breast feeding.  Engorgement prevention/treatment discussed.  Mother has a manual pump and a DEBP for home use.  Mother feels like her breasts are fuller and heavier today.    Encouraged to continue feeding on cue or at least 8-12 times/24 hours.  Mother is familiar with volumes for supplementation.  She will call for any concerns after discharge.  RN in room at the end of the visit and also still waiting on echo results.     Maternal Data    Feeding    LATCH Score                   Interventions    Lactation Tools Discussed/Used     Consult Status Consult Status: Complete Date: 09/12/20 Follow-up type: Call as needed    Jackie Newman 09/12/2020, 3:42 PM

## 2020-09-16 ENCOUNTER — Telehealth: Payer: Self-pay | Admitting: Obstetrics and Gynecology

## 2020-09-16 IMAGING — US US MFM OB FOLLOW-UP
1 series · 13 of 28 positions shown · non-contrast
Comparison: none

[Series 1: us mfm ob follow-up · 75 acquisitions, 13 frames shown]
[im 3/75]
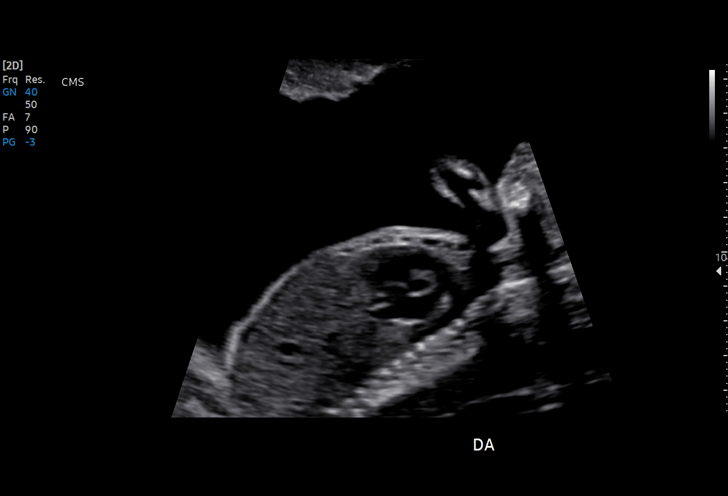
[im 9/75]
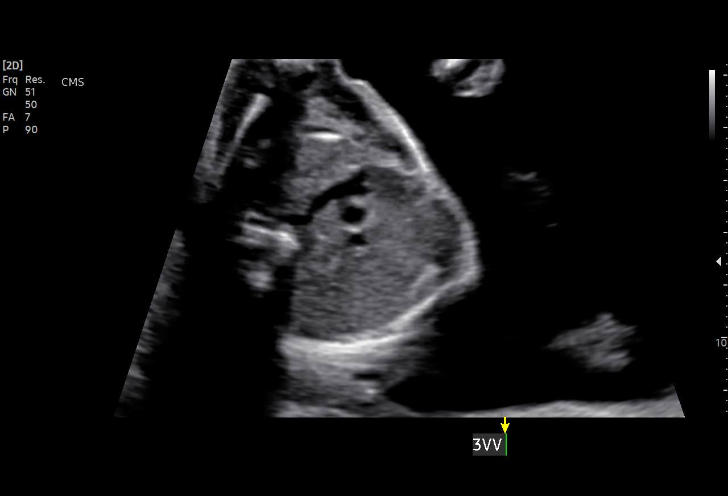
[im 14/75]
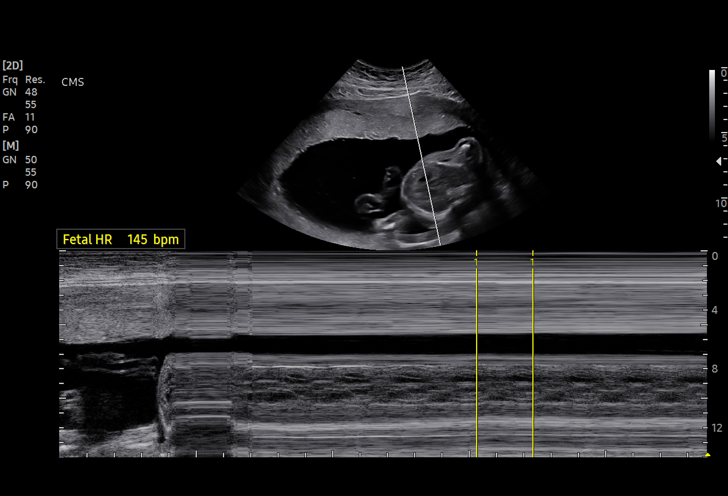
[im 20/75]
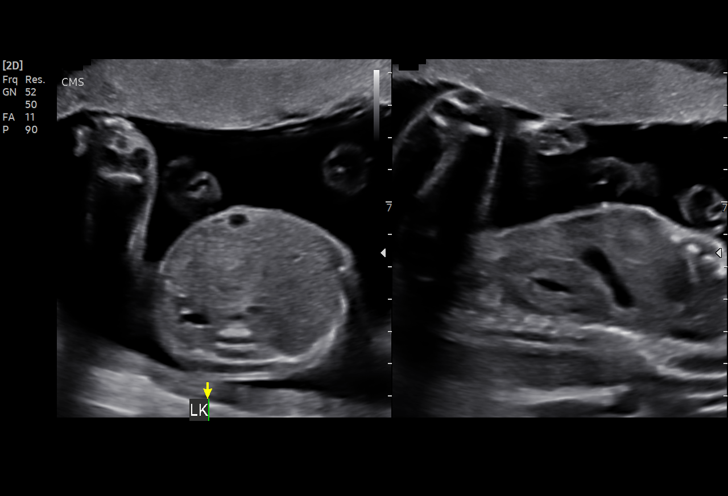
[im 25/75]
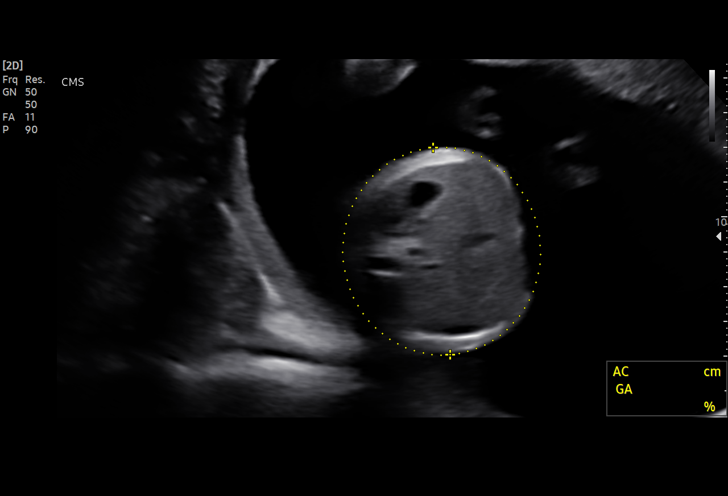
[im 31/75]
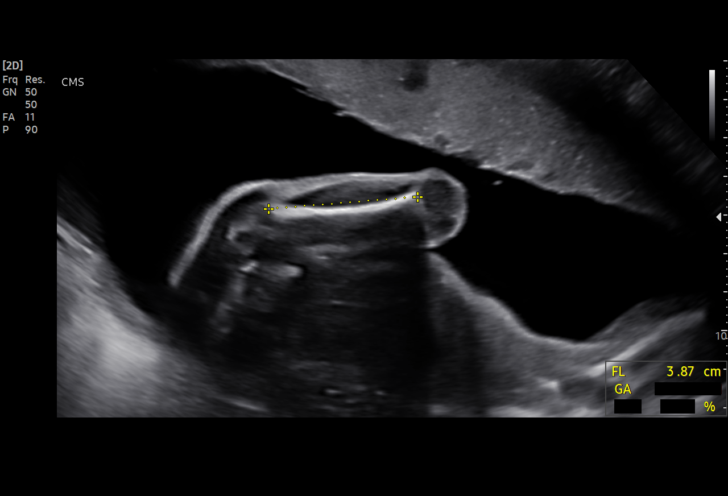
[im 39/75]
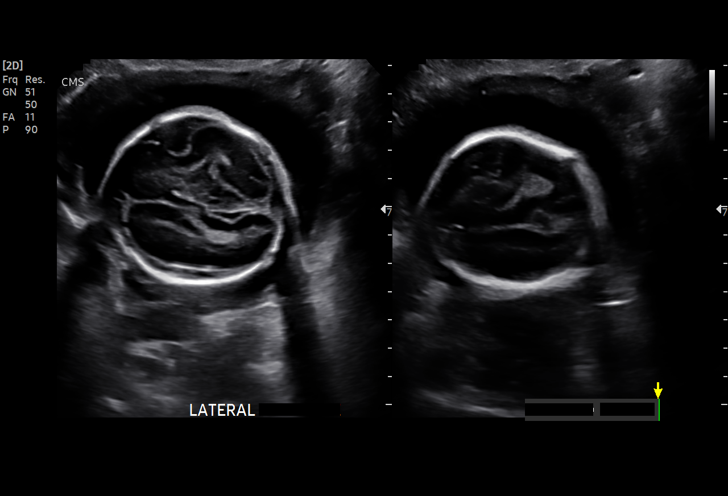
[im 44/75]
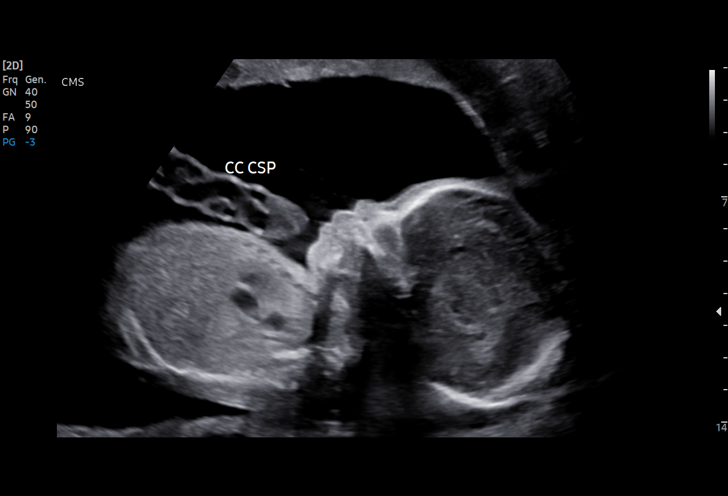
[im 50/75]
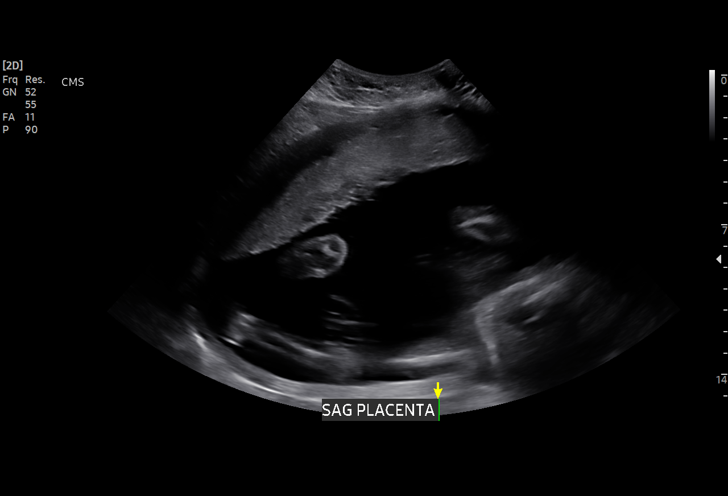
[im 55/75]
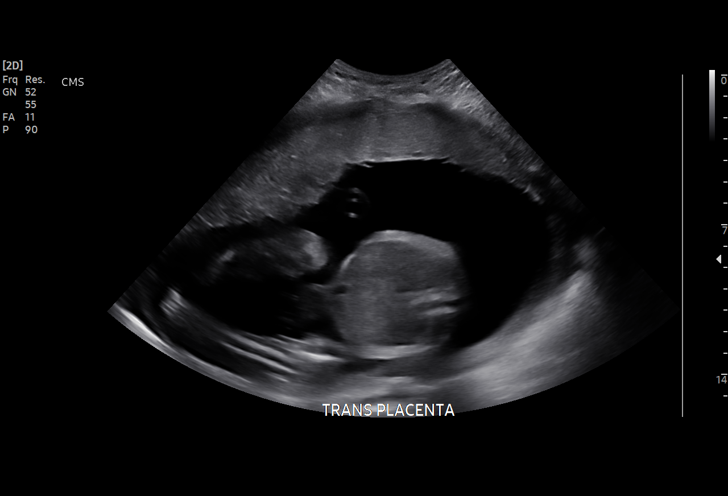
[im 61/75]
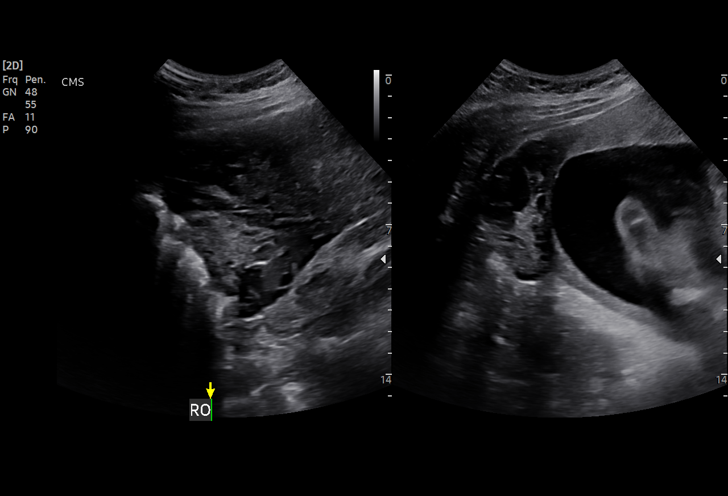
[im 66/75]
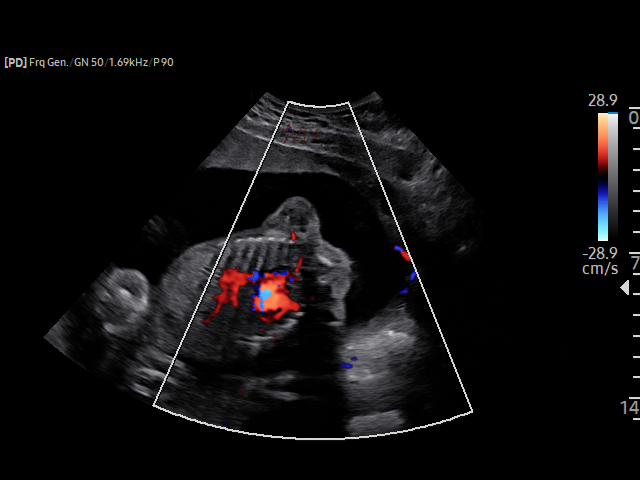
[im 72/75]
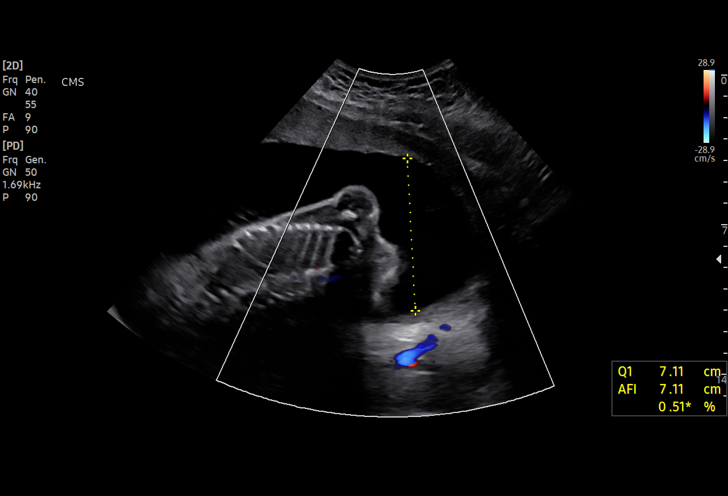

[13 of 28 positions shown; findings below may reference images not displayed]

Indications

 23 weeks gestation of pregnancy
 Low Risk NIPS, Neg Horizon & Neg AFP
 Hypertension - Chronic/Pre-existing
 (Labetalol)
 Antenatal follow-up for nonvisualized fetal
 anatomy
 Polyhydramnios, second trimester,
 antepartum condition or complication, fetus
 unspecified
Fetal Evaluation

 Num Of Fetuses:         1
 Fetal Heart Rate(bpm):  145
 Cardiac Activity:       Observed
 Presentation:           Cephalic
 Placenta:               Anterior
 P. Cord Insertion:      Visualized

 Amniotic Fluid
 AFI FV:      Polyhydramnios

 AFI Sum(cm)     %Tile       Largest Pocket(cm)
 31              > 97

 RUQ(cm)       RLQ(cm)       LUQ(cm)        LLQ(cm)
 8
Biometry

 BPD:        60  mm     G. Age:  24w 3d         90  %    CI:        80.24   %    70 - 86
                                                         FL/HC:      18.3   %    19.2 -
 HC:      211.6  mm     G. Age:  23w 2d         44  %    HC/AC:      1.15        1.05 -
 AC:       184   mm     G. Age:  23w 1d         49  %    FL/BPD:     64.5   %    71 - 87
 FL:       38.7  mm     G. Age:  22w 3d         21  %    FL/AC:      21.0   %    20 - 24
 HUM:        36  mm     G. Age:  22w 4d         32  %
 Est. FW:     551  gm      1 lb 3 oz     41  %
OB History

 Gravidity:    2         Term:   0         SAB:   1
 Living:       0
Gestational Age

 LMP:           23w 0d        Date:  12/11/19                 EDD:   09/16/20
 U/S Today:     23w 2d                                        EDD:   09/14/20
 Best:          23w 0d     Det. By:  LMP  (12/11/19)          EDD:   09/16/20
Anatomy

 Cranium:               Appears normal         Aortic Arch:            Appears normal
 Cavum:                 Appears normal         Ductal Arch:            Appears normal
 Ventricles:            Appears normal         Diaphragm:              Appears normal
 Choroid Plexus:        Appears normal         Stomach:                Appears normal, left
                                                                       sided
 Cerebellum:            Appears normal         Abdomen:                Appears normal
 Posterior Fossa:       Appears normal         Abdominal Wall:         Appears nml (cord
                                                                       insert, abd wall)
 Nuchal Fold:           Not applicable (>20    Cord Vessels:           Appears normal (3
                        wks GA)                                        vessel cord)
 Face:                  Appears normal         Kidneys:                Appear normal
                        (orbits and profile)
 Lips:                  Appears normal         Bladder:                Appears normal
 Thoracic:              Appears normal         Spine:                  Previously seen
 Heart:                 Appears normal         Upper Extremities:      Previously seen
                        (4CH, axis, and
                        situs)
 RVOT:                  Appears normal         Lower Extremities:      Previously seen
 LVOT:                  Appears normal

 Other:  Heels visualized. Nasal bone visualized.
Cervix Uterus Adnexa

 Cervix
 Length:            3.4  cm.
 Normal appearance by transabdominal scan.

 Uterus
 No abnormality visualized.

 Right Ovary
 Within normal limits.

 Left Ovary
 Within normal limits.

 Cul De Sac
 No free fluid seen.

 Adnexa
 No abnormality visualized.
Impression

 Patient return for completion of fetal anatomy.  She has
 chronic hypertension and takes labetalol 200 mg twice daily.
 Blood pressure today at our office is 108/65 mmHg.
 On ultrasound, amniotic fluid is slightly increased.Fetal
 growth is appropriate for gestational age .Fetal anatomical
 survey was completed and appears normal.  Cardiac
 anatomy appears normal.
 I reassured the patient of the findings.  I explained the
 significance of mild polyhydramnios and recommended
 screening for gestational diabetes in 2 weeks
Recommendations

 -Screen for GDM in 2 weeks.
 -An appointment was made for her to return in 4 weeks for
 fetal growth assessment.
                 De La Cruz, Geraldo

## 2020-09-16 NOTE — Telephone Encounter (Signed)
Patient wanted to get clarification about what the peds providers told her about her baby being tachypneic. She reports going to the pediatrician Saturday and was told the baby was still breathing fast, but seems to be settling down. She returns to the pediatrician today.   She also reports that her feet are swollen. She states she is drinking plenty of water and elevating her feet. BP not taken since being home d/t malfunctioning BP cuff. She has a BP check appt tomorrow at [facility].   Reassurance given that pediatricians are doing the best they can and encouraged to ask them questions when she is not understanding what information they are giving her. Patient verbalized an understanding of the plan of care and agrees.   Raelyn Mora, CNM

## 2020-09-17 ENCOUNTER — Ambulatory Visit (INDEPENDENT_AMBULATORY_CARE_PROVIDER_SITE_OTHER): Payer: Medicaid Other | Admitting: *Deleted

## 2020-09-17 ENCOUNTER — Other Ambulatory Visit: Payer: Self-pay

## 2020-09-17 VITALS — BP 140/92 | HR 72 | Temp 98.8°F | Ht 65.0 in | Wt 198.0 lb

## 2020-09-17 DIAGNOSIS — O165 Unspecified maternal hypertension, complicating the puerperium: Secondary | ICD-10-CM

## 2020-09-17 DIAGNOSIS — Z0130 Encounter for examination of blood pressure without abnormal findings: Secondary | ICD-10-CM

## 2020-09-17 NOTE — Patient Instructions (Addendum)
How to Take Your Blood Pressure You can take your blood pressure at home with a machine. You may need to check your blood pressure at home:  To check if you have high blood pressure (hypertension).  To check your blood pressure over time.  To make sure your blood pressure medicine is working. Supplies needed: You will need a blood pressure machine, or monitor. You can buy one at a drugstore or online. When choosing one:  Choose one with an arm cuff.  Choose one that wraps around your upper arm. Only one finger should fit between your arm and the cuff.  Do not choose one that measures your blood pressure from your wrist or finger. Your doctor can suggest a monitor. How to prepare Avoid these things for 30 minutes before checking your blood pressure:  Drinking caffeine.  Drinking alcohol.  Eating.  Smoking.  Exercising. Five minutes before checking your blood pressure:  Pee.  Sit in a dining chair. Avoid sitting in a soft couch or armchair.  Be quiet. Do not talk. How to take your blood pressure Follow the instructions that came with your machine. If you have a digital blood pressure monitor, these may be the instructions: 1. Sit up straight. 2. Place your feet on the floor. Do not cross your ankles or legs. 3. Rest your left arm at the level of your heart. You may rest it on a table, desk, or chair. 4. Pull up your shirt sleeve. 5. Wrap the blood pressure cuff around the upper part of your left arm. The cuff should be 1 inch (2.5 cm) above your elbow. It is best to wrap the cuff around bare skin. 6. Fit the cuff snugly around your arm. You should be able to place only one finger between the cuff and your arm. 7. Put the cord inside the groove of your elbow. 8. Press the power button. 9. Sit quietly while the cuff fills with air and loses air. 10. Write down the numbers on the screen. 11. Wait 2-3 minutes and then repeat steps 1-10. What do the numbers mean? Two  numbers make up your blood pressure. The first number is called systolic pressure. The second is called diastolic pressure. An example of a blood pressure reading is "120 over 80" (or 120/80). If you are an adult and do not have a medical condition, use this guide to find out if your blood pressure is normal: Normal  First number: below 120.  Second number: below 80. Elevated  First number: 120-129.  Second number: below 80. Hypertension stage 1  First number: 130-139.  Second number: 80-89. Hypertension stage 2  First number: 140 or above.  Second number: 90 or above. Your blood pressure is above normal even if only the top or bottom number is above normal. Follow these instructions at home:  Check your blood pressure as often as your doctor tells you to.  Take your monitor to your next doctor's appointment. Your doctor will: ? Make sure you are using it correctly. ? Make sure it is working right.  Make sure you understand what your blood pressure numbers should be.  Tell your doctor if your medicines are causing side effects. Contact a doctor if:  Your blood pressure keeps being high. Get help right away if:  Your first blood pressure number is higher than 180.  Your second blood pressure number is higher than 120. This information is not intended to replace advice given to you by  your health care provider. Make sure you discuss any questions you have with your health care provider. Document Revised: 10/29/2017 Document Reviewed: 04/24/2016 Elsevier Patient Education  2020 Elsevier Inc[employer]  Postpartum Hypertension Postpartum hypertension is high blood pressure that remains higher than normal after childbirth. You may not realize that you have postpartum hypertension if your blood pressure is not being checked regularly. In most cases, postpartum hypertension will go away on its own, usually within a week of delivery. However, for some women, medical treatment is  required to prevent serious complications, such as seizures or stroke. What are the causes? This condition may be caused by one or more of the following:  Hypertension that existed before pregnancy (chronic hypertension).  Hypertension that comes on as a result of pregnancy (gestational hypertension).  Hypertensive disorders during pregnancy (preeclampsia) or seizures in women who have high blood pressure during pregnancy (eclampsia).  A condition in which the liver, platelets, and red blood cells are damaged during pregnancy (HELLP syndrome).  A condition in which the thyroid produces too much hormones (hyperthyroidism).  Other rare problems of the nerves (neurological disorders) or blood disorders. In some cases, the cause may not be known. What increases the risk? The following factors may make you more likely to develop this condition:  Chronic hypertension. In some cases, this may not have been diagnosed before pregnancy.  Obesity.  Type 2 diabetes.  Kidney disease.  History of preeclampsia or eclampsia.  Other medical conditions that change the level of hormones in the body (hormonal imbalance). What are the signs or symptoms? As with all types of hypertension, postpartum hypertension may not have any symptoms. Depending on how high your blood pressure is, you may experience:  Headaches. These may be mild, moderate, or severe. They may also be steady, constant, or sudden in onset (thunderclap headache).  Changes in your ability to see (visual changes).  Dizziness.  Shortness of breath.  Swelling of your hands, feet, lower legs, or face. In some cases, you may have swelling in more than one of these locations.  Heart palpitations or a racing heartbeat.  Difficulty breathing while lying down.  Decrease in the amount of urine that you pass. Other rare signs and symptoms may include:  Sweating more than usual. This lasts longer than a few days after  delivery.  Chest pain.  Sudden dizziness when you get up from sitting or lying down.  Seizures.  Nausea or vomiting.  Abdominal pain. How is this diagnosed? This condition may be diagnosed based on the results of a physical exam, blood pressure measurements, and blood and urine tests. You may also have other tests, such as a CT scan or an MRI, to check for other problems of postpartum hypertension. How is this treated? If blood pressure is high enough to require treatment, your options may include:  Medicines to reduce blood pressure (antihypertensives). Tell your health care provider if you are breastfeeding or if you plan to breastfeed. There are many antihypertensive medicines that are safe to take while breastfeeding.  Stopping medicines that may be causing hypertension.  Treating medical conditions that are causing hypertension.  Treating the complications of hypertension, such as seizures, stroke, or kidney problems. Your health care provider will also continue to monitor your blood pressure closely until it is within a safe range for you. Follow these instructions at home:  Take over-the-counter and prescription medicines only as told by your health care provider.  Return to your normal activities as told by  your health care provider. Ask your health care provider what activities are safe for you.  Do not use any products that contain nicotine or tobacco, such as cigarettes and e-cigarettes. If you need help quitting, ask your health care provider.  Keep all follow-up visits as told by your health care provider. This is important. Contact a health care provider if:  Your symptoms get worse.  You have new symptoms, such as: ? A headache that does not get better. ? Dizziness. ? Visual changes. Get help right away if:  You suddenly develop swelling in your hands, ankles, or face.  You have sudden, rapid weight gain.  You develop difficulty breathing, chest pain,  racing heartbeat, or heart palpitations.  You develop severe pain in your abdomen.  You have any symptoms of a stroke. "BE FAST" is an easy way to remember the main warning signs of a stroke: ? B - Balance. Signs are dizziness, sudden trouble walking, or loss of balance. ? E - Eyes. Signs are trouble seeing or a sudden change in vision. ? F - Face. Signs are sudden weakness or numbness of the face, or the face or eyelid drooping on one side. ? A - Arms. Signs are weakness or numbness in an arm. This happens suddenly and usually on one side of the body. ? S - Speech. Signs are sudden trouble speaking, slurred speech, or trouble understanding what people say. ? T - Time. Time to call emergency services. Write down what time symptoms started.  You have other signs of a stroke, such as: ? A sudden, severe headache with no known cause. ? Nausea or vomiting. ? Seizure. These symptoms may represent a serious problem that is an emergency. Do not wait to see if the symptoms will go away. Get medical help right away. Call your local emergency services (911 in the [place] Do not drive yourself to the hospital. Summary  Postpartum hypertension is high blood pressure that remains higher than normal after childbirth.  In most cases, postpartum hypertension will go away on its own, usually within a week of delivery.  For some women, medical treatment is required to prevent serious complications, such as seizures or stroke. This information is not intended to replace advice given to you by your health care provider. Make sure you discuss any questions you have with your health care provider. Document Revised: 12/23/2018 Document Reviewed: 09/06/2017 Elsevier Patient Education  2020 [employer].

## 2020-09-17 NOTE — Progress Notes (Signed)
   Subjective:  Jackie Newman is a 29 y.o. female here for BP check. Patient reported she did not take blood pressure medication prior to appointment.  Hypertension ROS: taking medications as instructed, no medication side effects noted, no TIA's, no chest pain on exertion, no dyspnea on exertion, noting swelling of ankles, no orthostatic dizziness or lightheadedness, no orthopnea or paroxysmal nocturnal dyspnea and no palpitations.    Objective:  LMP 12/11/2019   Appearance alert, well appearing, and in no distress, oriented to person, place, and time and normal appearing weight. General exam BP noted to be elevated today in office.   Today's Vitals   09/17/20 0945 09/17/20 0959  BP: (!) 151/90 (!) 140/92  Pulse: 80 72  Temp: 98.8 F (37.1 C)   TempSrc: Oral   Weight: 198 lb (89.8 kg)   Height: 5\' 5"  (1.651 m)     Assessment:   Blood Pressure asymptomatic, needs further observation and needs improvement.   Plan:  Repeat labs ordered prior to next appointment. Reviewed medications and side effects in detail. Follow up: 3 days and as needed. Need repeat BP check on 09/20/20. New BP monitor given. Patient teaching on how to check blood pressure and patient demonstrated on how to check BP at home. Advised patient to take BP med when she get home, wait 20-30 minutes and recheck BP. Send Nurse BP reading.  09/22/20, RN

## 2020-09-18 ENCOUNTER — Telehealth: Payer: Self-pay | Admitting: *Deleted

## 2020-09-18 ENCOUNTER — Other Ambulatory Visit: Payer: Self-pay | Admitting: Obstetrics and Gynecology

## 2020-09-18 DIAGNOSIS — O165 Unspecified maternal hypertension, complicating the puerperium: Secondary | ICD-10-CM

## 2020-09-18 DIAGNOSIS — R7401 Elevation of levels of liver transaminase levels: Secondary | ICD-10-CM

## 2020-09-18 LAB — COMPREHENSIVE METABOLIC PANEL
ALT: 68 IU/L — ABNORMAL HIGH (ref 0–32)
AST: 40 IU/L (ref 0–40)
Albumin/Globulin Ratio: 1.4 (ref 1.2–2.2)
Albumin: 3.9 g/dL (ref 3.9–5.0)
Alkaline Phosphatase: 109 IU/L (ref 44–121)
BUN/Creatinine Ratio: 6 — ABNORMAL LOW (ref 9–23)
BUN: 5 mg/dL — ABNORMAL LOW (ref 6–20)
Bilirubin Total: 0.2 mg/dL (ref 0.0–1.2)
CO2: 21 mmol/L (ref 20–29)
Calcium: 9.1 mg/dL (ref 8.7–10.2)
Chloride: 106 mmol/L (ref 96–106)
Creatinine, Ser: 0.78 mg/dL (ref 0.57–1.00)
GFR calc Af Amer: 120 mL/min/{1.73_m2} (ref >59)
GFR calc non Af Amer: 105 mL/min/{1.73_m2} (ref >59)
Globulin, Total: 2.8 g/dL (ref 1.5–4.5)
Glucose: 74 mg/dL (ref 65–99)
Potassium: 4.1 mmol/L (ref 3.5–5.2)
Sodium: 142 mmol/L (ref 134–144)
Total Protein: 6.7 g/dL (ref 6.0–8.5)

## 2020-09-18 LAB — CBC
Hematocrit: 37.2 % (ref 34.0–46.6)
Hemoglobin: 12.3 g/dL (ref 11.1–15.9)
MCH: 30.8 pg (ref 26.6–33.0)
MCHC: 33.1 g/dL (ref 31.5–35.7)
MCV: 93 fL (ref 79–97)
Platelets: 367 10*3/uL (ref 150–450)
RBC: 3.99 x10E6/uL (ref 3.77–5.28)
RDW: 13.6 % (ref 11.7–15.4)
WBC: 8.2 10*3/uL (ref 3.4–10.8)

## 2020-09-18 LAB — PROTEIN / CREATININE RATIO, URINE
Creatinine, Urine: 61.3 mg/dL
Protein, Ur: 7.9 mg/dL
Protein/Creat Ratio: 129 mg/g creat (ref 0–200)

## 2020-09-18 MED ORDER — AMLODIPINE BESYLATE 10 MG PO TABS: 10.0000 mg | ORAL_TABLET | Freq: Every day | ORAL | 3 refills | Status: DC

## 2020-09-18 NOTE — Progress Notes (Signed)
Medication dose increased d/t continued elevated BP with medication compliance. Patient notified by My Chart.  Raelyn Mora, CNM

## 2020-09-18 NOTE — Telephone Encounter (Signed)
Patient advised of recent labs and increase in medication. Patient to return on Friday for LFTs labs and blood pressure check.  Clovis Pu, RN

## 2020-09-18 NOTE — Telephone Encounter (Signed)
-----   Message from [place], [place] sent at 09/18/2020  1:35 PM EDT ----- I have increased her medication to Norvasc 10 mg. Per Dr. Donavan Foil she will need to have LFTs drawn again Friday. Raelyn Mora, CNM

## 2020-09-20 ENCOUNTER — Ambulatory Visit (INDEPENDENT_AMBULATORY_CARE_PROVIDER_SITE_OTHER): Payer: Medicaid Other | Admitting: *Deleted

## 2020-09-20 ENCOUNTER — Other Ambulatory Visit: Payer: Self-pay | Admitting: Obstetrics and Gynecology

## 2020-09-20 ENCOUNTER — Other Ambulatory Visit: Payer: Self-pay

## 2020-09-20 VITALS — BP 131/84 | HR 97 | Temp 98.4°F | Wt 194.8 lb

## 2020-09-20 DIAGNOSIS — K5900 Constipation, unspecified: Secondary | ICD-10-CM

## 2020-09-20 DIAGNOSIS — O165 Unspecified maternal hypertension, complicating the puerperium: Secondary | ICD-10-CM

## 2020-09-20 DIAGNOSIS — R7401 Elevation of levels of liver transaminase levels: Secondary | ICD-10-CM

## 2020-09-20 MED ORDER — AMLODIPINE BESYLATE 10 MG PO TABS: 10.0000 mg | ORAL_TABLET | Freq: Every day | ORAL | 3 refills | Status: DC

## 2020-09-20 MED ORDER — POLYETHYLENE GLYCOL 3350 17 G PO PACK: 17.0000 g | PACK | Freq: Every day | ORAL | 0 refills | Status: DC

## 2020-09-20 NOTE — Progress Notes (Signed)
   Subjective:  Jackie Newman is a 29 y.o. female here for BP check.   Hypertension ROS: taking medications as instructed, no TIA's, no chest pain on exertion, no dyspnea on exertion, no swelling of ankles, no orthostatic dizziness or lightheadedness, no orthopnea or paroxysmal nocturnal dyspnea, no palpitations and constipation.  Patient picked up new Rx for blood pressure on Tuesday, however she misplaced the bottle.  Patient also she might have hemorroids. She has not had a bowel movement since discharge. She only took the stool softeners while in the hospital. Patient reported pain with walking and sitting.  Objective:  BP 131/84 (BP Location: Left Arm, Patient Position: Sitting, Cuff Size: Normal)   Pulse 97   Temp 98.4 F (36.9 C) (Oral)   Wt 194 lb 12.8 oz (88.4 kg)   LMP 12/11/2019   BMI 32.42 kg/m    Today's Vitals   09/20/20 1056 09/20/20 1116  BP: (!) 159/85 131/84  Pulse: 80 97  Temp: 98.4 F (36.9 C)   TempSrc: Oral   Weight: 194 lb 12.8 oz (88.4 kg)    Appearance alert, well appearing, and in no distress, oriented to person, place, and time and normal appearing weight. General exam BP noted to be well controlled today in office.    Assessment:   Blood Pressure,asymptomatic, needs improvement.  Plan:  Current treatment plan is effective, no change in therapy. Miralax Rx sent to pharmacy. Also advised patient to take a sitz bath and Tuck pads as needed.   LFT's drawn today Refill for amlodipine sent to pharmacy.  Clovis Pu, RN

## 2020-09-21 LAB — HEPATIC FUNCTION PANEL
ALT: 44 IU/L — ABNORMAL HIGH (ref 0–32)
AST: 24 IU/L (ref 0–40)
Albumin: 3.8 g/dL — ABNORMAL LOW (ref 3.9–5.0)
Alkaline Phosphatase: 104 IU/L (ref 44–121)
Bilirubin Total: <0.2 mg/dL (ref 0.0–1.2)
Bilirubin, Direct: <0.1 mg/dL (ref 0.00–0.40)
Total Protein: 6.9 g/dL (ref 6.0–8.5)

## 2020-10-10 ENCOUNTER — Encounter: Payer: Self-pay | Admitting: General Practice

## 2020-10-15 ENCOUNTER — Other Ambulatory Visit: Payer: Self-pay

## 2020-10-15 ENCOUNTER — Ambulatory Visit (INDEPENDENT_AMBULATORY_CARE_PROVIDER_SITE_OTHER): Payer: Medicaid Other | Admitting: *Deleted

## 2020-10-15 VITALS — BP 119/77 | HR 69 | Temp 98.4°F | Ht 65.0 in | Wt 194.4 lb

## 2020-10-15 DIAGNOSIS — Z0130 Encounter for examination of blood pressure without abnormal findings: Secondary | ICD-10-CM

## 2020-10-15 NOTE — Progress Notes (Signed)
° °  Subjective:  Jackie Newman is a 29 y.o. female here for BP check.   Hypertension ROS: taking medications as instructed, no medication side effects noted, patient does not perform home BP monitoring, no TIA's, no chest pain on exertion, no dyspnea on exertion, no swelling of ankles, no orthostatic dizziness or lightheadedness, no orthopnea or paroxysmal nocturnal dyspnea, no palpitations and no intermittent claudication symptoms.    Objective:  BP 119/77 (BP Location: Left Arm, Patient Position: Sitting, Cuff Size: Large)    Pulse 69    Temp 98.4 F (36.9 C) (Oral)    Ht 5\' 5"  (1.651 m)    Wt 194 lb 6.4 oz (88.2 kg)    LMP 12/11/2019    Breastfeeding No    BMI 32.35 kg/m   Appearance alert, well appearing, and in no distress, oriented to person, place, and time and normal appearing weight. General exam BP noted to be well controlled today in office.    Assessment:   Blood Pressure well controlled, asymptomatic and no significant medication side effects noted.   Plan:  Current treatment plan is effective, no change in therapy.  Return to work note given per patient request. PP appointment 10/23/20.  10/25/20, RN

## 2020-10-23 ENCOUNTER — Other Ambulatory Visit: Payer: Self-pay

## 2020-10-23 ENCOUNTER — Encounter: Payer: Self-pay | Admitting: Obstetrics and Gynecology

## 2020-10-23 ENCOUNTER — Ambulatory Visit (INDEPENDENT_AMBULATORY_CARE_PROVIDER_SITE_OTHER): Payer: Medicaid Other | Admitting: Obstetrics and Gynecology

## 2020-10-23 DIAGNOSIS — O1093 Unspecified pre-existing hypertension complicating the puerperium: Secondary | ICD-10-CM

## 2020-10-23 DIAGNOSIS — I10 Essential (primary) hypertension: Secondary | ICD-10-CM

## 2020-10-23 NOTE — Progress Notes (Signed)
Post Partum Visit Note  Jackie Newman is a 29 y.o. G48P1011 female who presents for a postpartum visit. She is 6 weeks postpartum following a normal spontaneous vaginal delivery.  I have fully reviewed the prenatal and intrapartum course. The delivery was at 39.1 gestational weeks.  Anesthesia: IV sedation. Postpartum course has been uncomplicated. Baby is doing well. Baby is feeding by both breast and bottle - Nutramigen. Bleeding thick, heavy lochia, red and changing a tampon and pad 6 times a day. Bowel function is normal. Bladder function is normal. Patient is not sexually active. Contraception method is condoms. Postpartum depression screening: negative.   The pregnancy intention screening data noted above was reviewed. Potential methods of contraception were discussed. The patient elected to proceed with Female Condom and Female Condom.    Edinburgh Postnatal Depression Scale - 10/23/20 1031      Edinburgh Postnatal Depression Scale:  In the Past 7 Days   I have been able to laugh and see the funny side of things. 0    I have looked forward with enjoyment to things. 0    I have blamed myself unnecessarily when things went wrong. 0    I have been anxious or worried for no good reason. 0    I have felt scared or panicky for no good reason. 0    Things have been getting on top of me. 0    I have been so unhappy that I have had difficulty sleeping. 0    I have felt sad or miserable. 0    I have been so unhappy that I have been crying. 0    The thought of harming myself has occurred to me. 0    Edinburgh Postnatal Depression Scale Total 0            The following portions of the patient's history were reviewed and updated as appropriate: allergies, current medications, past family history, past medical history, past social history, past surgical history and problem list.  Review of Systems Constitutional: negative Eyes: negative Ears, nose, mouth, throat, and face:  negative Respiratory: negative Cardiovascular: negative Gastrointestinal: negative Genitourinary:positive for heavy bleeding (LMP 10/21/20) Integument/breast: negative Hematologic/lymphatic: negative Musculoskeletal:negative Neurological: negative Behavioral/Psych: negative Endocrine: negative Allergic/Immunologic: negative    Objective:  LMP 10/21/2020 (Exact Date)    General:  alert, cooperative and no distress   Breasts:  inspection negative, no nipple discharge or bleeding, no masses or nodularity palpable  Lungs: clear to auscultation bilaterally  Heart:  regular rate and rhythm, S1, S2 normal, no murmur, click, rub or gallop  Abdomen: soft, non-tender; bowel sounds normal; no masses,  no organomegaly   Vulva:  not evaluated  Vagina: not evaluated  Cervix:  not evaluated  Corpus: not examined  Adnexa:  not evaluated  Rectal Exam: Not performed.        Assessment:  Encounter for postpartum visit - Normal postpartum exam. Pap smear not done at today's visit.  Chronic hypertension     Plan:   Essential components of care per ACOG recommendations:  1.  Mood and well being: Patient with negative depression screening today. Reviewed local resources for support.  - Patient does not use tobacco.  - hx of drug use? No    2. Infant care and feeding:  -Patient currently breastmilk feeding? Yes If breastmilk feeding discussed return to work and pumping. If needed, patient was provided letter for work to allow for every 2-3 hr pumping breaks, and to be granted  a private location to express breastmilk and refrigerated area to store breastmilk. Reviewed importance of draining breast regularly to support lactation. -Social determinants of health (SDOH) reviewed in EPIC. No concerns  3. Sexuality, contraception and birth spacing - Patient does not want a pregnancy in the next year.  Desired family size is 1 children.  - Reviewed forms of contraception in tiered fashion. Patient  desired condoms today.   - Discussed birth spacing of 18 months  4. Sleep and fatigue -Encouraged family/partner/community support of 4 hrs of uninterrupted sleep to help with mood and fatigue  5. Physical Recovery  - Discussed patients delivery and complications - Patient had a 2nd degree perineal laceration, perineal healing reviewed. Patient expressed understanding - Patient has urinary incontinence? No  - Patient is safe to resume physical and sexual activity  6.  Health Maintenance - Last pap smear done 03/06/2020 and was abnormal with LSIL -- colposcopy done with no sampling; recommendation for follow-up Mammogram at age 89  7. Chronic Disease - PCP schedule with [facility] to manage cHTN  Raelyn Mora, CNM [facility] [employer]

## 2020-10-28 ENCOUNTER — Telehealth: Payer: Self-pay | Admitting: General Practice

## 2020-10-28 NOTE — Telephone Encounter (Signed)
Patient aware of appt scheduled with Dr. Shawnie Pons on 12/03/2019 at 9:35am for Colpo. Pt verbalized understanding.

## 2020-10-28 NOTE — Telephone Encounter (Signed)
-----   Message from Raelyn Mora, [place] sent at 10/23/2020  9:03 PM EST ----- Regarding: Schedule Colpo with MD She saw Dr. Shawnie Pons during pregnancy and was noted she would need follow-up postpartum.  Raelyn Mora, CNM

## 2020-10-29 ENCOUNTER — Encounter: Payer: Self-pay | Admitting: General Practice

## 2020-11-04 ENCOUNTER — Ambulatory Visit (INDEPENDENT_AMBULATORY_CARE_PROVIDER_SITE_OTHER): Payer: Medicaid Other | Admitting: Primary Care

## 2020-11-21 IMAGING — US US ABDOMEN LIMITED
1 series · 15 of 25 positions shown · non-contrast
Comparison: None.

CLINICAL DATA: Upper abdominal pain, 32 weeks pregnant, elevated
serum amylase

EXAM:
ULTRASOUND ABDOMEN LIMITED RIGHT UPPER QUADRANT

[Series 1: us abdomen limited · 15 of 43 slices shown]
[im 1/43]
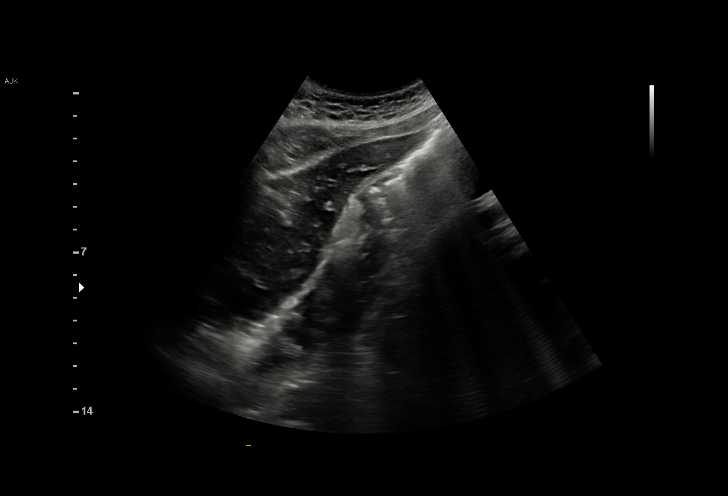
[im 4/43]
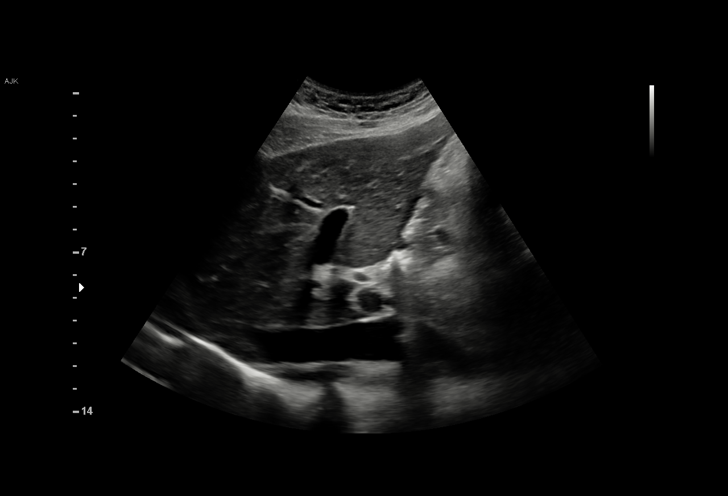
[im 8/43]
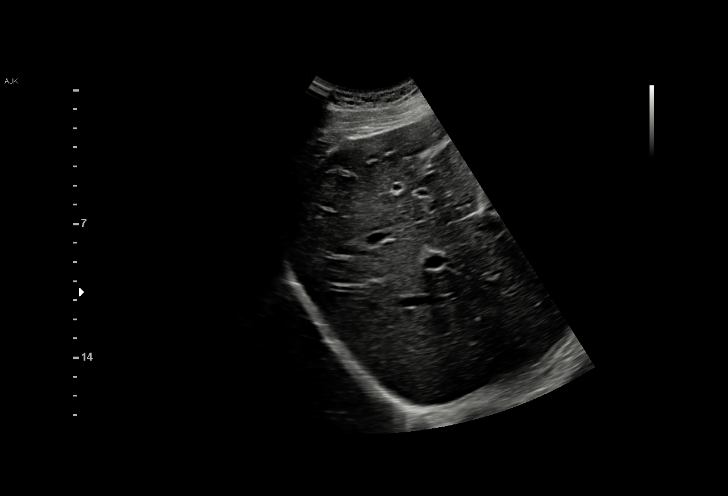
[im 9/43]
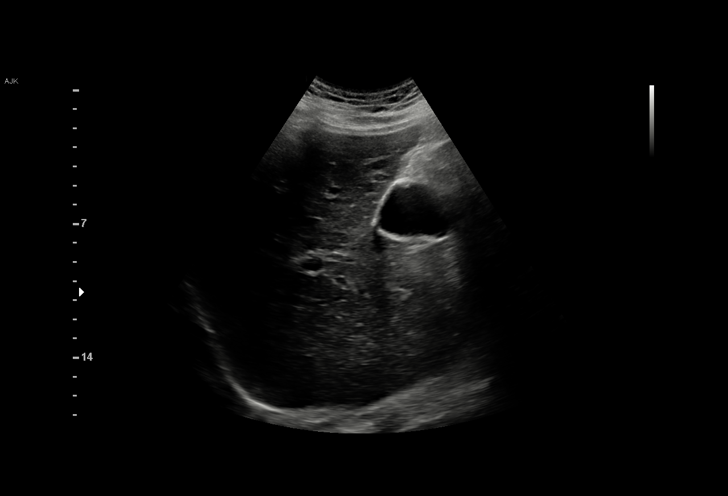
[im 13/43]
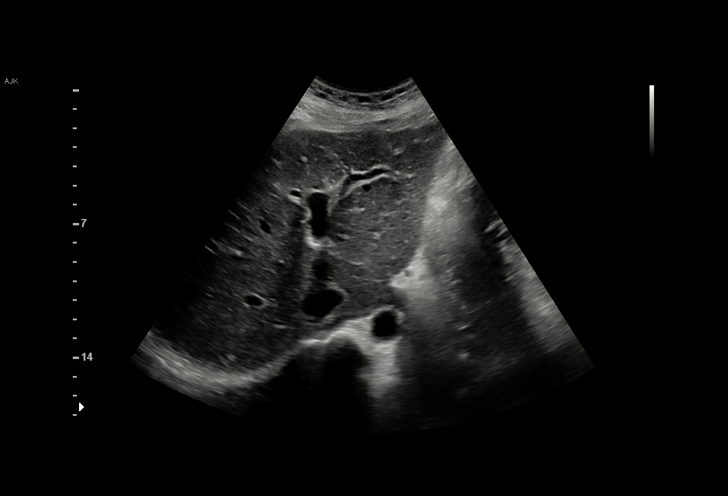
[im 16/43]
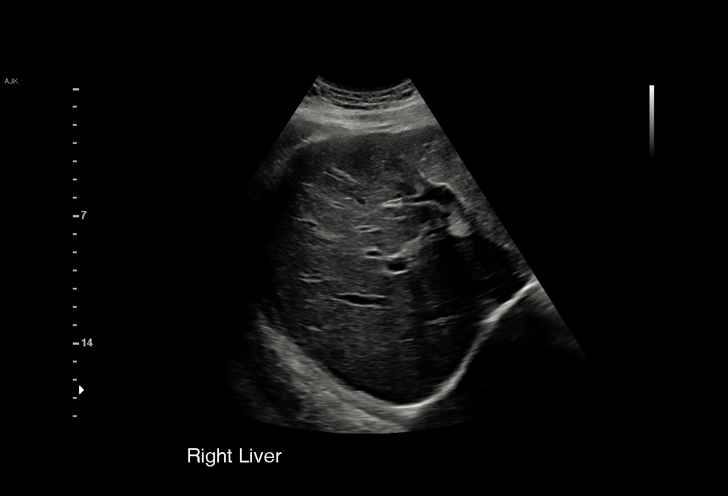
[im 18/43]
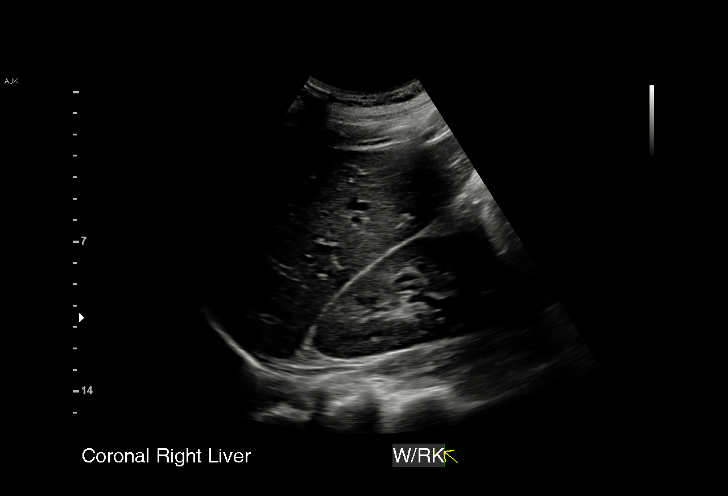
[im 22/43]
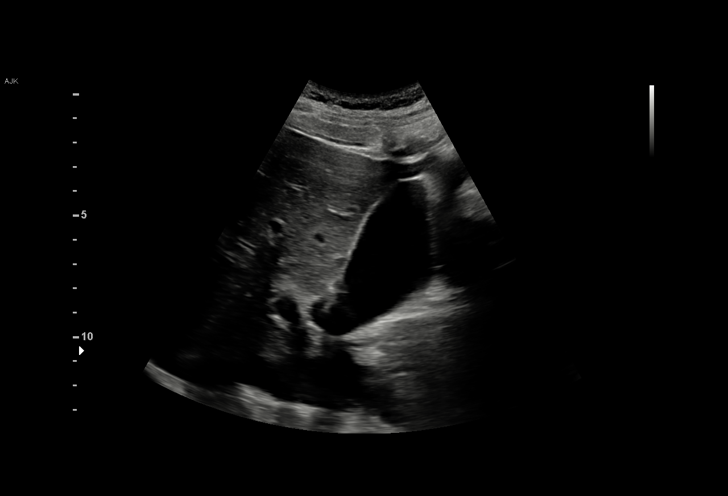
[im 25/43]
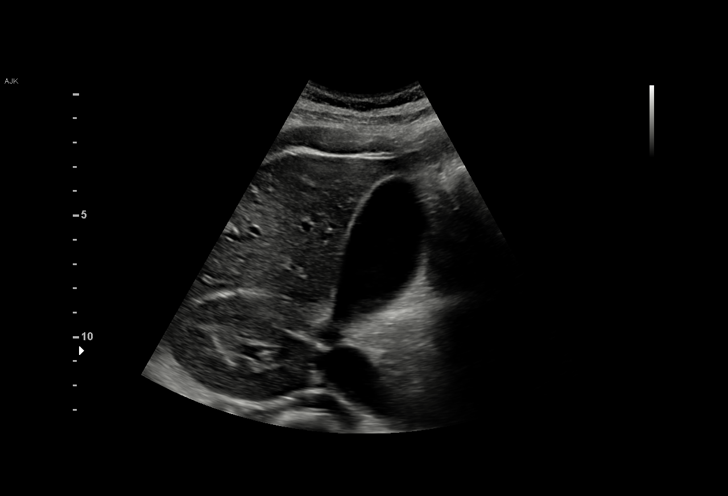
[im 27/43]
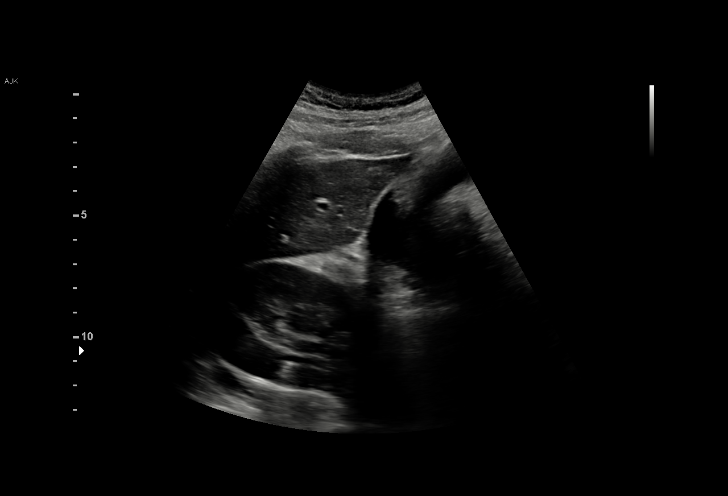
[im 30/43]
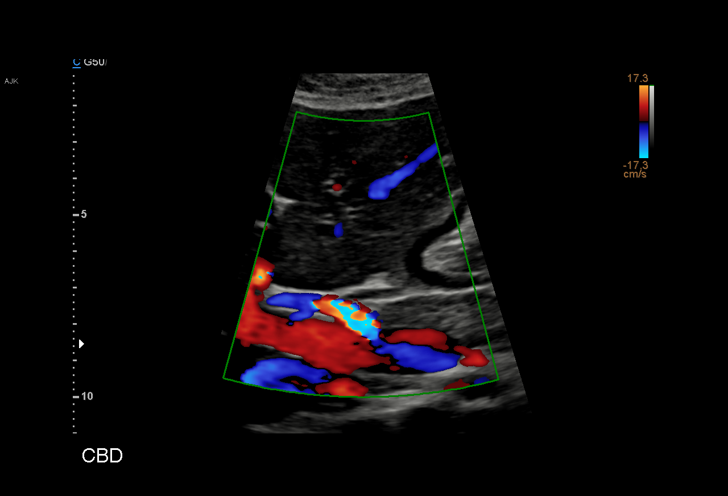
[im 34/43]
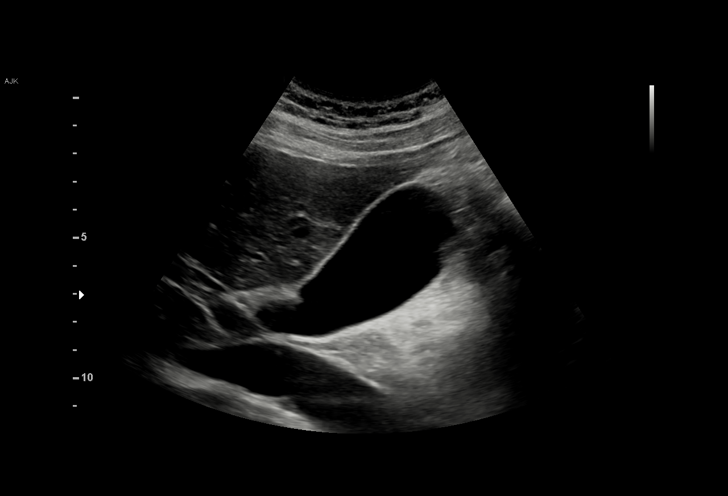
[im 36/43]
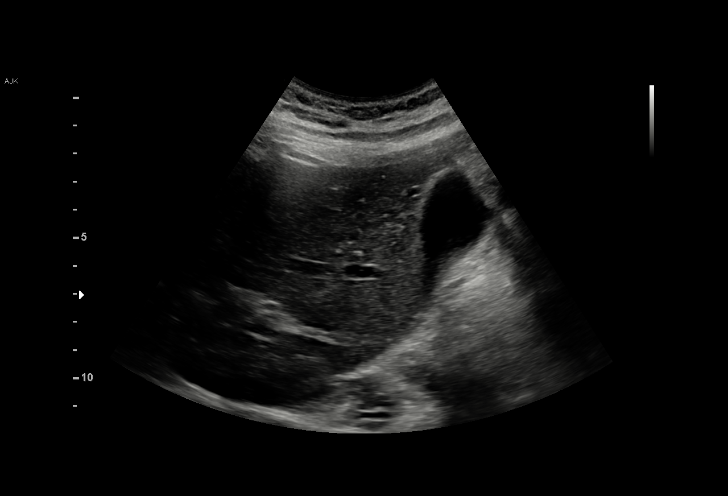
[im 39/43]
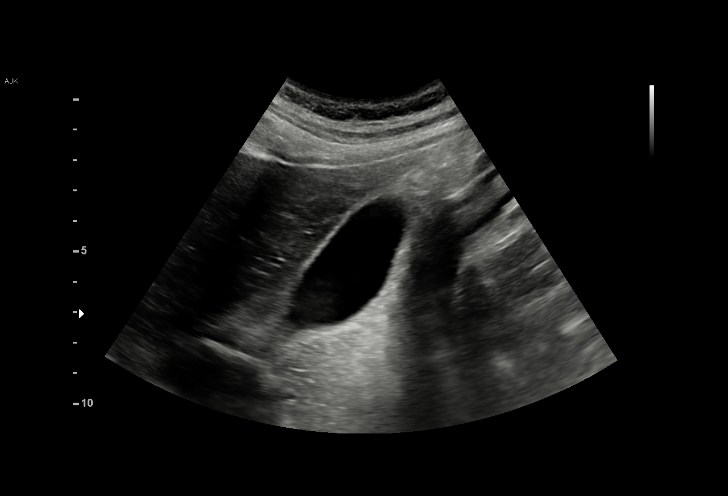
[im 43/43]
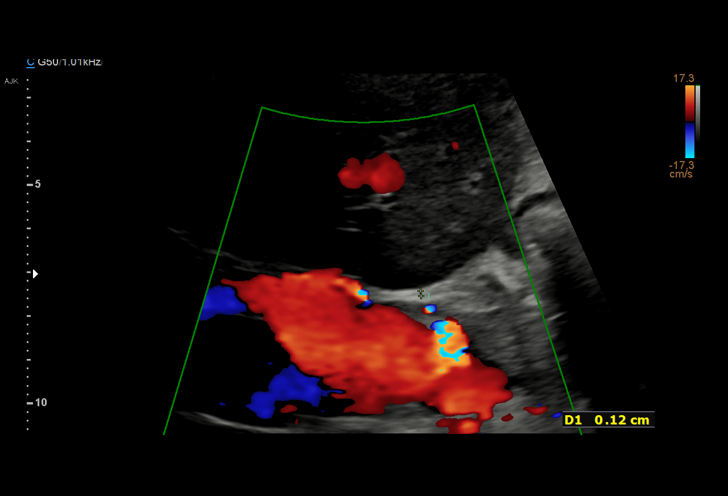

[15 of 25 positions shown; findings below may reference images not displayed]

FINDINGS: Gallbladder:

No gallstones or wall thickening visualized. No sonographic Murphy
sign noted by sonographer.

Common bile duct:

Diameter: 3 mm in mid diameter

Liver:

No focal lesion identified. Within normal limits in parenchymal
echogenicity. Portal vein is patent on color Doppler imaging with
normal direction of blood flow towards the liver.

Other: No ascites identified within the right upper quadrant.
IMPRESSION: Unremarkable right upper quadrant ultrasound as described.

## 2020-12-02 ENCOUNTER — Ambulatory Visit: Payer: Medicaid Other | Admitting: Family Medicine

## 2020-12-02 ENCOUNTER — Encounter: Payer: Self-pay | Admitting: Family Medicine

## 2020-12-02 NOTE — Progress Notes (Signed)
Patient did not keep appointment today. She will be called to reschedule.  

## 2020-12-11 ENCOUNTER — Ambulatory Visit
Admission: EM | Admit: 2020-12-11 | Discharge: 2020-12-11 | Disposition: A | Discharge status: Home / Self Care | Payer: Medicaid Other | Attending: Emergency Medicine | Admitting: Emergency Medicine

## 2020-12-11 ENCOUNTER — Encounter (HOSPITAL_COMMUNITY): Payer: Self-pay

## 2020-12-11 ENCOUNTER — Other Ambulatory Visit: Payer: Self-pay

## 2020-12-11 ENCOUNTER — Emergency Department (HOSPITAL_COMMUNITY)
Admission: EM | Admit: 2020-12-11 | Discharge: 2020-12-11 | Disposition: A | Discharge status: Home / Self Care | Payer: Medicaid Other | Attending: Emergency Medicine | Admitting: Emergency Medicine

## 2020-12-11 DIAGNOSIS — T7840XA Allergy, unspecified, initial encounter: Secondary | ICD-10-CM | POA: Diagnosis not present

## 2020-12-11 DIAGNOSIS — Z5321 Procedure and treatment not carried out due to patient leaving prior to being seen by health care provider: Secondary | ICD-10-CM | POA: Diagnosis not present

## 2020-12-11 DIAGNOSIS — L500 Allergic urticaria: Secondary | ICD-10-CM | POA: Diagnosis present

## 2020-12-11 MED ORDER — CETIRIZINE HCL 10 MG PO CAPS: 10.0000 mg | ORAL_CAPSULE | Freq: Every day | ORAL | 0 refills | Status: DC

## 2020-12-11 MED ORDER — PREDNISONE 10 MG PO TABS: ORAL_TABLET | ORAL | 0 refills | Status: DC

## 2020-12-11 NOTE — ED Notes (Signed)
Pt sts she is going home.  

## 2020-12-11 NOTE — ED Triage Notes (Signed)
Pt reports her mother having an allergic reaction 3 days ago and noticed that same day she began to have a rash as well. She states she has whip marks on her back back, arms and legs x 3 days. She states she took benadryl and hydrocortisone with no relief. Pt denies the rash being hot to the touch and painful. She states the rash is only itchy. Pt states her daughter is also developing the rash on her legs and arms. She states she did not touch her mother when the rash started to develop.

## 2020-12-11 NOTE — Discharge Instructions (Signed)
Begin prednisone taper over the next 6 days-recommend 6 tablets / 60 mg on day 1, decrease by 1 tablet each day until complete-6, 5, 4, 3, 2, 1-take with food Please supplement with daily cetirizine/Zyrtec or loratadine/Claritin in the morning, Benadryl in the evening to further help with the itching Please follow-up if rash and symptoms not resolving worsening or changing

## 2020-12-11 NOTE — ED Provider Notes (Signed)
[name]    CSN: [id number] Arrival date & time: 12/11/20  1236      History   Chief Complaint Chief Complaint  Patient presents with  . Rash    HPI Jackie Newman is a 30 y.o. female history of hypertension presenting today for evaluation of a rash.  Reports that she has had rash with hives for approximately 4 days.  Reports mother and baby at home with similar.  Using Benadryl and cortisone without full relief.  Denies any shortness of breath or difficulty breathing.  Denies any new foods medicines, soaps, lotions, detergents or other hygiene products.  Reports that linear distribution of rash occurred prior to itching.  HPI  Past Medical History:  Diagnosis Date  . Asthma    last used inhaler a month ago  . Congenital umbilical hernia   . Hypertension   . Ovarian cyst   . Seizures (HCC)    No follow up. Patient stated she had one seizure during her 23st birthday drinking alcohol.    Patient Active Problem List   Diagnosis Date Noted  . Vaginal delivery 09/10/2020  . Encounter for induction of labor 09/09/2020  . Supervision of high risk pregnancy in third trimester 09/05/2020  . Positive GBS test 07/28/2020  . Preterm labor 07/25/2020  . Elevated amylase 07/25/2020  . Elevated AST (SGOT) 07/25/2020  . Polyhydramnios affecting pregnancy 07/25/2020  . LGSIL on Pap smear of cervix 05/02/2020  . Chronic hypertension affecting pregnancy 04/04/2020  . Trichomonas vaginalis (TV) infection 03/07/2020  . Supervision of other normal pregnancy, antepartum 02/13/2020  . Morning sickness 01/22/2020    Past Surgical History:  Procedure Laterality Date  . COLONOSCOPY WITH ESOPHAGOGASTRODUODENOSCOPY (EGD)  12/04/2019    OB History    Gravida  2   Para  1   Term  1   Preterm      AB  1   Living  1     SAB  1   IAB      Ectopic      Multiple  0   Live Births  1            Home Medications    Prior to Admission medications    Medication Sig Start Date End Date Taking? Authorizing Provider  amLODipine (NORVASC) 10 MG tablet Take 1 tablet (10 mg total) by mouth daily. Patient misplaced the previous prescription. 09/20/20  Yes Raelyn Mora, CNM  Cetirizine HCl 10 MG CAPS Take 1 capsule (10 mg total) by mouth daily for 10 days. 12/11/20 12/21/20 Yes Wieters, Hallie C, PA-C  predniSONE (DELTASONE) 10 MG tablet Begin with 6 tabs on day 1, 5 tab on day 2, 4 tab on day 3, 3 tab on day 4, 2 tab on day 5, 1 tab on day 6-take with food 12/11/20  Yes Wieters, Hallie C, PA-C  albuterol (VENTOLIN HFA) 108 (90 Base) MCG/ACT inhaler Inhale 1-2 puffs into the lungs every 6 (six) hours as needed for wheezing or shortness of breath. 05/02/20   Reva Bores, MD  fluticasone (FLONASE) 50 MCG/ACT nasal spray Place 2 sprays into both nostrils daily for 6 days. Patient not taking: Reported on 09/09/2020 07/27/20 12/11/20  Reva Bores, MD    Family History Family History  Problem Relation Age of Onset  . Asthma Father   . Gout Father     Social History Social History   Tobacco Use  . Smoking status: Passive Smoke Exposure - Never Smoker  .  Smokeless tobacco: Never Used  Vaping Use  . Vaping Use: Never used  Substance Use Topics  . Alcohol use: No    Comment: occasional socially  . Drug use: Not Currently    Types: Marijuana    Comment: quit 3-4 weeks ago     Allergies   Doxycycline   Review of Systems Review of Systems  Constitutional: Negative for fatigue and fever.  HENT: Negative for mouth sores.   Eyes: Negative for visual disturbance.  Respiratory: Negative for shortness of breath.   Cardiovascular: Negative for chest pain.  Gastrointestinal: Negative for abdominal pain, nausea and vomiting.  Genitourinary: Negative for genital sores.  Musculoskeletal: Negative for arthralgias and joint swelling.  Skin: Positive for color change and rash. Negative for wound.  Neurological: Negative for dizziness, weakness,  light-headedness and headaches.     Physical Exam Triage Vital Signs ED Triage Vitals  Enc Vitals Group     BP      Pulse      Resp      Temp      Temp src      SpO2      Weight      Height      Head Circumference      Peak Flow      Pain Score      Pain Loc      Pain Edu?      Excl. in GC?    No data found.  Updated Vital Signs BP 131/86 (BP Location: Left Arm)   Pulse 70   Temp 98 F (36.7 C) (Oral)   Resp 18   LMP 12/09/2020 (Exact Date)   SpO2 98%   Breastfeeding No   Visual Acuity Right Eye Distance:   Left Eye Distance:   Bilateral Distance:    Right Eye Near:   Left Eye Near:    Bilateral Near:     Physical Exam Vitals and nursing note reviewed.  Constitutional:      Appearance: She is well-developed and well-nourished.     Comments: No acute distress  HENT:     Head: Normocephalic and atraumatic.     Nose: Nose normal.  Eyes:     Conjunctiva/sclera: Conjunctivae normal.  Cardiovascular:     Rate and Rhythm: Normal rate.  Pulmonary:     Effort: Pulmonary effort is normal. No respiratory distress.     Comments: Breathing comfortably at rest, CTABL, no wheezing, rales or other adventitious sounds auscultated Abdominal:     General: There is no distension.  Musculoskeletal:        General: Normal range of motion.     Cervical back: Neck supple.  Skin:    General: Skin is warm and dry.     Comments: Erythematous raised linear hive-like lesions noted without any associated scabbing or papular lesions noted to upper extremities, back and proximal lower legs  Neurological:     Mental Status: She is alert and oriented to person, place, and time.  Psychiatric:        Mood and Affect: Mood and affect normal.      UC Treatments / Results  Labs (all labs ordered are listed, but only abnormal results are displayed) Labs Reviewed - No data to display  EKG   Radiology No results found.  Procedures Procedures (including critical care  time)  Medications Ordered in UC Medications - No data to display  Initial Impression / Assessment and Plan / UC Course  I have  reviewed the triage vital signs and the nursing notes.  Pertinent labs & imaging results that were available during my care of the patient were reviewed by me and considered in my medical decision making (see chart for details).     Rash does appear allergic in nature, does not appear suggestive of scabies at this time, but questionable etiology given reported mother and daughter with similar rash.  Treating with course of prednisone and antihistamines, continue to monitor for resolution of rash.  Unclear trigger at this time.  Discussed strict return precautions. Patient verbalized understanding and is agreeable with plan.  Final Clinical Impressions(s) / UC Diagnoses   Final diagnoses:  Allergic reaction, initial encounter     Discharge Instructions     Begin prednisone taper over the next 6 days-recommend 6 tablets / 60 mg on day 1, decrease by 1 tablet each day until complete-6, 5, 4, 3, 2, 1-take with food Please supplement with daily cetirizine/Zyrtec or loratadine/Claritin in the morning, Benadryl in the evening to further help with the itching Please follow-up if rash and symptoms not resolving worsening or changing    ED Prescriptions    Medication Sig Dispense Auth. Provider   predniSONE (DELTASONE) 10 MG tablet Begin with 6 tabs on day 1, 5 tab on day 2, 4 tab on day 3, 3 tab on day 4, 2 tab on day 5, 1 tab on day 6-take with food 21 tablet Wieters, Hallie C, PA-C   Cetirizine HCl 10 MG CAPS Take 1 capsule (10 mg total) by mouth daily for 10 days. 10 capsule Wieters, [place] C, PA-C     PDMP not reviewed this encounter.   Lew Dawes, PA-C 12/11/20 1310

## 2020-12-11 NOTE — ED Triage Notes (Signed)
Pt arrived via walk in, with welts in linear nature, states x4 days, her mother first developed them, then she did, no baby at home has them as well. States she has been taking benadryl and cortisone with little effect. No resp. Issues.

## 2021-02-06 ENCOUNTER — Other Ambulatory Visit: Payer: Self-pay

## 2021-02-06 ENCOUNTER — Ambulatory Visit
Admission: EM | Admit: 2021-02-06 | Discharge: 2021-02-06 | Disposition: A | Discharge status: Home / Self Care | Payer: Medicaid Other | Attending: Emergency Medicine | Admitting: Emergency Medicine

## 2021-02-06 ENCOUNTER — Encounter: Payer: Self-pay | Admitting: *Deleted

## 2021-02-06 DIAGNOSIS — R059 Cough, unspecified: Secondary | ICD-10-CM | POA: Diagnosis not present

## 2021-02-06 DIAGNOSIS — R0982 Postnasal drip: Secondary | ICD-10-CM

## 2021-02-06 MED ORDER — FLUTICASONE PROPIONATE 50 MCG/ACT NA SUSP: 1.0000 | Freq: Every day | NASAL | 0 refills | Status: DC

## 2021-02-06 MED ORDER — CETIRIZINE HCL 10 MG PO CAPS: 10.0000 mg | ORAL_CAPSULE | Freq: Every day | ORAL | 0 refills | Status: DC

## 2021-02-06 MED ORDER — BENZONATATE 200 MG PO CAPS: 200.0000 mg | ORAL_CAPSULE | Freq: Three times a day (TID) | ORAL | 0 refills | Status: AC | PRN

## 2021-02-06 MED ORDER — DM-GUAIFENESIN ER 30-600 MG PO TB12: 1.0000 | ORAL_TABLET | Freq: Two times a day (BID) | ORAL | 0 refills | Status: DC

## 2021-02-06 NOTE — Discharge Instructions (Signed)
Covid test pending Begin daily cetirizine and Flonase to help with congestion and drainage, throat irritation Tessalon every 8 hours for cough May supplement the above with Mucinex DM twice daily as needed for further cough and congestion Rest and fluids Follow-up if not improving or worsening

## 2021-02-06 NOTE — ED Provider Notes (Signed)
[name]    CSN: [id number] Arrival date & time: 02/06/21  1033      History   Chief Complaint Chief Complaint  Patient presents with  . Sore Throat    HPI Jackie Newman is a 30 y.o. female history of asthma, hypertension presenting today for evaluation of a sore throat.  Reports associated cough, headache.  Symptoms began 3 days ago.  Denies fevers chills or body aches.  Sore throat is mainly a throat irritation/tickle as well as outsides of postnasal drainage.  Denies significant sinus pressure or congestion.  HPI  Past Medical History:  Diagnosis Date  . Asthma    last used inhaler a month ago  . Congenital umbilical hernia   . Hypertension   . Ovarian cyst   . Seizures (HCC)    No follow up. Patient stated she had one seizure during her 23st birthday drinking alcohol.    Patient Active Problem List   Diagnosis Date Noted  . Vaginal delivery 09/10/2020  . Encounter for induction of labor 09/09/2020  . Supervision of high risk pregnancy in third trimester 09/05/2020  . Positive GBS test 07/28/2020  . Preterm labor 07/25/2020  . Elevated amylase 07/25/2020  . Elevated AST (SGOT) 07/25/2020  . Polyhydramnios affecting pregnancy 07/25/2020  . LGSIL on Pap smear of cervix 05/02/2020  . Chronic hypertension affecting pregnancy 04/04/2020  . Trichomonas vaginalis (TV) infection 03/07/2020  . Supervision of other normal pregnancy, antepartum 02/13/2020  . Morning sickness 01/22/2020    Past Surgical History:  Procedure Laterality Date  . COLONOSCOPY WITH ESOPHAGOGASTRODUODENOSCOPY (EGD)  12/04/2019    OB History    Gravida  2   Para  1   Term  1   Preterm      AB  1   Living  1     SAB  1   IAB      Ectopic      Multiple  0   Live Births  1            Home Medications    Prior to Admission medications   Medication Sig Start Date End Date Taking? Authorizing Provider  benzonatate (TESSALON) 200 MG capsule Take 1  capsule (200 mg total) by mouth 3 (three) times daily as needed for up to 7 days for cough. 02/06/21 02/13/21 Yes Marianne Golightly C, PA-C  Cetirizine HCl 10 MG CAPS Take 1 capsule (10 mg total) by mouth daily for 10 days. 02/06/21 02/16/21 Yes Shirell Struthers C, PA-C  dextromethorphan-guaiFENesin (MUCINEX DM) 30-600 MG 12hr tablet Take 1 tablet by mouth 2 (two) times daily. 02/06/21  Yes Tejay Hubert C, PA-C  fluticasone (FLONASE) 50 MCG/ACT nasal spray Place 1-2 sprays into both nostrils daily. 02/06/21  Yes Anias Bartol C, PA-C  albuterol (VENTOLIN HFA) 108 (90 Base) MCG/ACT inhaler Inhale 1-2 puffs into the lungs every 6 (six) hours as needed for wheezing or shortness of breath. 05/02/20   Reva Bores, MD  amLODipine (NORVASC) 10 MG tablet Take 1 tablet (10 mg total) by mouth daily. Patient misplaced the previous prescription. 09/20/20   Raelyn Mora, CNM  predniSONE (DELTASONE) 10 MG tablet Begin with 6 tabs on day 1, 5 tab on day 2, 4 tab on day 3, 3 tab on day 4, 2 tab on day 5, 1 tab on day 6-take with food 12/11/20   Armstead Heiland, [place] C, PA-C    Family History Family History  Problem Relation Age of Onset  .  Asthma Father   . Gout Father     Social History Social History   Tobacco Use  . Smoking status: Passive Smoke Exposure - Never Smoker  . Smokeless tobacco: Never Used  Vaping Use  . Vaping Use: Never used  Substance Use Topics  . Alcohol use: No    Comment: occasional socially  . Drug use: Not Currently    Types: Marijuana    Comment: quit 3-4 weeks ago     Allergies   Doxycycline   Review of Systems Review of Systems  Constitutional: Negative for activity change, appetite change, chills, fatigue and fever.  HENT: Positive for congestion, rhinorrhea, sinus pressure and sore throat. Negative for ear pain and trouble swallowing.   Eyes: Negative for discharge and redness.  Respiratory: Positive for cough. Negative for chest tightness and shortness of breath.    Cardiovascular: Negative for chest pain.  Gastrointestinal: Negative for abdominal pain, diarrhea, nausea and vomiting.  Musculoskeletal: Negative for myalgias.  Skin: Negative for rash.  Neurological: Negative for dizziness, light-headedness and headaches.     Physical Exam Triage Vital Signs ED Triage Vitals  Enc Vitals Group     BP      Pulse      Resp      Temp      Temp src      SpO2      Weight      Height      Head Circumference      Peak Flow      Pain Score      Pain Loc      Pain Edu?      Excl. in GC?    No data found.  Updated Vital Signs BP 120/75 (BP Location: Left Arm)   Pulse 85   Temp 98 F (36.7 C) (Oral)   Resp 16   SpO2 99%   Visual Acuity Right Eye Distance:   Left Eye Distance:   Bilateral Distance:    Right Eye Near:   Left Eye Near:    Bilateral Near:     Physical Exam Vitals and nursing note reviewed.  Constitutional:      Appearance: She is well-developed.     Comments: No acute distress  HENT:     Head: Normocephalic and atraumatic.     Ears:     Comments: Bilateral ears without tenderness to palpation of external auricle, tragus and mastoid, EAC's without erythema or swelling, TM's with good bony landmarks and cone of light. Non erythematous.     Nose: Nose normal.     Mouth/Throat:     Comments: Oral mucosa pink and moist, no tonsillar enlargement , left tonsil with exudate, but no erythema or swelling. Posterior pharynx patent and nonerythematous, no uvula deviation or swelling. Normal phonation. Eyes:     Conjunctiva/sclera: Conjunctivae normal.  Cardiovascular:     Rate and Rhythm: Normal rate and regular rhythm.  Pulmonary:     Effort: Pulmonary effort is normal. No respiratory distress.     Comments: Breathing comfortably at rest, CTABL, no wheezing, rales or other adventitious sounds auscultated Abdominal:     General: There is no distension.  Musculoskeletal:        General: Normal range of motion.      Cervical back: Neck supple.  Skin:    General: Skin is warm and dry.  Neurological:     Mental Status: She is alert and oriented to person, place, and time.  UC Treatments / Results  Labs (all labs ordered are listed, but only abnormal results are displayed) Labs Reviewed  NOVEL CORONAVIRUS, NAA    EKG   Radiology No results found.  Procedures Procedures (including critical care time)  Medications Ordered in UC Medications - No data to display  Initial Impression / Assessment and Plan / UC Course  I have reviewed the triage vital signs and the nursing notes.  Pertinent labs & imaging results that were available during my care of the patient were reviewed by me and considered in my medical decision making (see chart for details).     Viral URI with cough versus allergic rhinitis with cough, treating symptomatically and supportively, lungs clear to auscultation, exam reassuring.  Screening for Covid.  Discussed strict return precautions. Patient verbalized understanding and is agreeable with plan.  Final Clinical Impressions(s) / UC Diagnoses   Final diagnoses:  Cough  Post-nasal drainage     Discharge Instructions     Covid test pending Begin daily cetirizine and Flonase to help with congestion and drainage, throat irritation Tessalon every 8 hours for cough May supplement the above with Mucinex DM twice daily as needed for further cough and congestion Rest and fluids Follow-up if not improving or worsening    ED Prescriptions    Medication Sig Dispense Auth. Provider   Cetirizine HCl 10 MG CAPS Take 1 capsule (10 mg total) by mouth daily for 10 days. 10 capsule Aunesty Tyson C, PA-C   fluticasone (FLONASE) 50 MCG/ACT nasal spray Place 1-2 sprays into both nostrils daily. 16 g Albertia Carvin C, PA-C   benzonatate (TESSALON) 200 MG capsule Take 1 capsule (200 mg total) by mouth 3 (three) times daily as needed for up to 7 days for cough. 28 capsule  Brittney Caraway C, PA-C   dextromethorphan-guaiFENesin (MUCINEX DM) 30-600 MG 12hr tablet Take 1 tablet by mouth 2 (two) times daily. 20 tablet Bridgid Printz, [place] C, PA-C     PDMP not reviewed this encounter.   Cai Anfinson, [place] C, PA-C 02/06/21 1059

## 2021-02-06 NOTE — ED Triage Notes (Signed)
Patient presents c/o sore throat/non productive cough/HA.  Reports this started 3 days ago.   Denies fever, body aches, chills.

## 2021-02-07 LAB — NOVEL CORONAVIRUS, NAA: SARS-CoV-2, NAA: NOT DETECTED

## 2021-02-07 LAB — SARS-COV-2, NAA 2 DAY TAT

## 2021-03-25 ENCOUNTER — Other Ambulatory Visit: Payer: Self-pay

## 2021-03-25 ENCOUNTER — Ambulatory Visit
Admission: EM | Admit: 2021-03-25 | Discharge: 2021-03-25 | Disposition: A | Discharge status: Home / Self Care | Payer: Medicaid Other | Attending: Emergency Medicine | Admitting: Emergency Medicine

## 2021-03-25 DIAGNOSIS — R197 Diarrhea, unspecified: Secondary | ICD-10-CM | POA: Diagnosis not present

## 2021-03-25 DIAGNOSIS — R509 Fever, unspecified: Secondary | ICD-10-CM

## 2021-03-25 DIAGNOSIS — Z20822 Contact with and (suspected) exposure to covid-19: Secondary | ICD-10-CM | POA: Diagnosis not present

## 2021-03-25 DIAGNOSIS — R112 Nausea with vomiting, unspecified: Secondary | ICD-10-CM | POA: Diagnosis not present

## 2021-03-25 MED ORDER — PROMETHAZINE HCL 25 MG RE SUPP: 25.0000 mg | Freq: Four times a day (QID) | RECTAL | 0 refills | Status: DC | PRN

## 2021-03-25 MED ORDER — ONDANSETRON 4 MG PO TBDP
4.0000 mg | ORAL_TABLET | Freq: Once | ORAL | Status: AC
  Administered 2021-03-25: 4 mg via ORAL

## 2021-03-25 NOTE — Discharge Instructions (Signed)
COVID/flu test pending Phenergan suppositories as needed for nausea/vomiting Drink plenty of fluids Tylenol/ibuprofen for fevers May try Imodium to help with diarrhea Please go to emergency room if symptoms progressing worsening, developing increased abdominal pain, persistent vomiting with increased lightheadedness dizziness, weakness

## 2021-03-25 NOTE — ED Triage Notes (Signed)
Pt in the waiting area states she feels like she is going to pass out and shaking all over. Assisted pt to wheel chair. Pt hyperventilating, pt educated to slow down breathing. Pt c/o n/vd, weakness and chills. No distress noted.

## 2021-03-25 NOTE — ED Triage Notes (Signed)
Pt c/o n/v/d with chills since 7am.

## 2021-03-26 LAB — COVID-19, FLU A+B NAA
Influenza A, NAA: NOT DETECTED
Influenza B, NAA: NOT DETECTED
SARS-CoV-2, NAA: NOT DETECTED

## 2021-03-27 NOTE — ED Provider Notes (Signed)
[name]    CSN: [id number] Arrival date & time: 03/25/21  1707      History   Chief Complaint Chief Complaint  Patient presents with  . Emesis    HPI Jackie Newman is a 30 y.o. female presents today for evaluation of nausea vomiting and diarrhea.  Patient reports that symptoms began this morning.  Has had associated generalized weakness, fatigue and chills.  Denies any URI symptoms.  Denies any close sick contacts.  Reports in past has had better relief with suppositories or patches for nausea rather than Zofran. HPI  Past Medical History:  Diagnosis Date  . Asthma    last used inhaler a month ago  . Congenital umbilical hernia   . Hypertension   . Ovarian cyst   . Seizures (HCC)    No follow up. Patient stated she had one seizure during her 23st birthday drinking alcohol.    Patient Active Problem List   Diagnosis Date Noted  . Vaginal delivery 09/10/2020  . Encounter for induction of labor 09/09/2020  . Supervision of high risk pregnancy in third trimester 09/05/2020  . Positive GBS test 07/28/2020  . Preterm labor 07/25/2020  . Elevated amylase 07/25/2020  . Elevated AST (SGOT) 07/25/2020  . Polyhydramnios affecting pregnancy 07/25/2020  . LGSIL on Pap smear of cervix 05/02/2020  . Chronic hypertension affecting pregnancy 04/04/2020  . Trichomonas vaginalis (TV) infection 03/07/2020  . Supervision of other normal pregnancy, antepartum 02/13/2020  . Morning sickness 01/22/2020    Past Surgical History:  Procedure Laterality Date  . COLONOSCOPY WITH ESOPHAGOGASTRODUODENOSCOPY (EGD)  12/04/2019    OB History    Gravida  2   Para  1   Term  1   Preterm      AB  1   Living  1     SAB  1   IAB      Ectopic      Multiple  0   Live Births  1            Home Medications    Prior to Admission medications   Medication Sig Start Date End Date Taking? Authorizing Provider  promethazine (PHENERGAN) 25 MG suppository Place  1 suppository (25 mg total) rectally every 6 (six) hours as needed for nausea or vomiting. 03/25/21  Yes Titus Drone C, PA-C  albuterol (VENTOLIN HFA) 108 (90 Base) MCG/ACT inhaler Inhale 1-2 puffs into the lungs every 6 (six) hours as needed for wheezing or shortness of breath. 05/02/20   Reva Bores, MD  amLODipine (NORVASC) 10 MG tablet Take 1 tablet (10 mg total) by mouth daily. Patient misplaced the previous prescription. 09/20/20   Raelyn Mora, CNM    Family History Family History  Problem Relation Age of Onset  . Asthma Father   . Gout Father     Social History Social History   Tobacco Use  . Smoking status: Passive Smoke Exposure - Never Smoker  . Smokeless tobacco: Never Used  Vaping Use  . Vaping Use: Never used  Substance Use Topics  . Alcohol use: No    Comment: occasional socially  . Drug use: Not Currently    Types: Marijuana    Comment: quit 3-4 weeks ago     Allergies   Doxycycline   Review of Systems Review of Systems  Constitutional: Positive for chills and fatigue. Negative for activity change, appetite change and fever.  HENT: Negative for congestion, ear pain, rhinorrhea, sinus pressure, sore throat  and trouble swallowing.   Eyes: Negative for discharge and redness.  Respiratory: Negative for cough, chest tightness and shortness of breath.   Cardiovascular: Negative for chest pain.  Gastrointestinal: Positive for diarrhea, nausea and vomiting. Negative for abdominal pain.  Musculoskeletal: Negative for myalgias.  Skin: Negative for rash.  Neurological: Negative for dizziness, light-headedness and headaches.     Physical Exam Triage Vital Signs ED Triage Vitals  Enc Vitals Group     BP 03/25/21 1905 106/70     Pulse Rate 03/25/21 1905 80     Resp 03/25/21 1905 18     Temp 03/25/21 1905 (!) 100.4 F (38 C)     Temp Source 03/25/21 1905 Oral     SpO2 03/25/21 1905 96 %     Weight --      Height --      Head Circumference --       Peak Flow --      Pain Score 03/25/21 1906 0     Pain Loc --      Pain Edu? --      Excl. in GC? --    No data found.  Updated Vital Signs BP 106/70 (BP Location: Left Arm)   Pulse 80   Temp (!) 100.4 F (38 C) (Oral)   Resp 18   LMP 03/25/2021   SpO2 96%   Breastfeeding No   Visual Acuity Right Eye Distance:   Left Eye Distance:   Bilateral Distance:    Right Eye Near:   Left Eye Near:    Bilateral Near:     Physical Exam Vitals and nursing note reviewed.  Constitutional:      Appearance: She is well-developed.     Comments: No acute distress  HENT:     Head: Normocephalic and atraumatic.     Ears:     Comments: Bilateral ears without tenderness to palpation of external auricle, tragus and mastoid, EAC's without erythema or swelling, TM's with good bony landmarks and cone of light. Non erythematous.     Nose: Nose normal.     Mouth/Throat:     Comments: Oral mucosa pink and moist, no tonsillar enlargement or exudate. Posterior pharynx patent and nonerythematous, no uvula deviation or swelling. Normal phonation. Eyes:     Conjunctiva/sclera: Conjunctivae normal.  Cardiovascular:     Rate and Rhythm: Normal rate.  Pulmonary:     Effort: Pulmonary effort is normal. No respiratory distress.     Comments: Breathing comfortably at rest, CTABL, no wheezing, rales or other adventitious sounds auscultated  Abdominal:     General: There is no distension.  Musculoskeletal:        General: Normal range of motion.     Cervical back: Neck supple.  Skin:    General: Skin is warm and dry.  Neurological:     Mental Status: She is alert and oriented to person, place, and time.      UC Treatments / Results  Labs (all labs ordered are listed, but only abnormal results are displayed) Labs Reviewed  COVID-19, FLU A+B NAA   Narrative:    Test(s) 140142-Influenza A, NAA; 140143-Influenza B, NAA was developed and its performance characteristics determined by [facility]. It  has not been cleared or approved by the Food and Drug Administration. Performed at:  [address], [place], [place]  [id number] Lab Director: Jolene Schimke MD, Phone:  [phone]    EKG   Radiology No results found.  Procedures  Procedures (including critical care time)  Medications Ordered in UC Medications  ondansetron (ZOFRAN-ODT) disintegrating tablet 4 mg (4 mg Oral Given 03/25/21 1733)    Initial Impression / Assessment and Plan / UC Course  I have reviewed the triage vital signs and the nursing notes.  Pertinent labs & imaging results that were available during my care of the patient were reviewed by me and considered in my medical decision making (see chart for details).     30 year old female with fever and acute onset nausea vomiting and diarrhea.  No abdominal pain or tenderness, mainly nausea/Zarbee's uterus.  Suspect viral etiology and recommending continued symptomatic and supportive care, oral rehydration.  Phenergan suppositories provided to use at home.  Discussed strict return precautions. Patient verbalized understanding and is agreeable with plan.  Final Clinical Impressions(s) / UC Diagnoses   Final diagnoses:  Encounter for screening laboratory testing for COVID-19 virus  Nausea vomiting and diarrhea  Fever, unspecified     Discharge Instructions     COVID/flu test pending Phenergan suppositories as needed for nausea/vomiting Drink plenty of fluids Tylenol/ibuprofen for fevers May try Imodium to help with diarrhea Please go to emergency room if symptoms progressing worsening, developing increased abdominal pain, persistent vomiting with increased lightheadedness dizziness, weakness   ED Prescriptions    Medication Sig Dispense Auth. Provider   promethazine (PHENERGAN) 25 MG suppository Place 1 suppository (25 mg total) rectally every 6 (six) hours as needed for nausea or vomiting. 16 each Adamaris King C, PA-C      PDMP not reviewed this encounter.   Sharyon Cable [place] C, [place] 03/27/21 0740

## 2022-08-09 ENCOUNTER — Emergency Department (HOSPITAL_COMMUNITY)
Admission: EM | Admit: 2022-08-09 | Discharge: 2022-08-10 | Discharge status: Against Medical Advice | Payer: Medicaid Other | Attending: Emergency Medicine | Admitting: Emergency Medicine

## 2022-08-09 ENCOUNTER — Encounter (HOSPITAL_COMMUNITY): Payer: Self-pay | Admitting: Emergency Medicine

## 2022-08-09 DIAGNOSIS — Z5321 Procedure and treatment not carried out due to patient leaving prior to being seen by health care provider: Secondary | ICD-10-CM | POA: Diagnosis not present

## 2022-08-09 DIAGNOSIS — R6883 Chills (without fever): Secondary | ICD-10-CM | POA: Diagnosis not present

## 2022-08-09 DIAGNOSIS — R197 Diarrhea, unspecified: Secondary | ICD-10-CM | POA: Diagnosis present

## 2022-08-09 DIAGNOSIS — R112 Nausea with vomiting, unspecified: Secondary | ICD-10-CM | POA: Insufficient documentation

## 2022-08-09 DIAGNOSIS — R1084 Generalized abdominal pain: Secondary | ICD-10-CM | POA: Diagnosis not present

## 2022-08-09 LAB — CBC WITH DIFFERENTIAL/PLATELET
Abs Immature Granulocytes: 0.04 10*3/uL (ref 0.00–0.07)
Basophils Absolute: 0.1 10*3/uL (ref 0.0–0.1)
Basophils Relative: 1 %
Eosinophils Absolute: 0.1 10*3/uL (ref 0.0–0.5)
Eosinophils Relative: 1 %
HCT: 41.8 % (ref 36.0–46.0)
Hemoglobin: 14.4 g/dL (ref 12.0–15.0)
Immature Granulocytes: 0 %
Lymphocytes Relative: 29 %
Lymphs Abs: 3.6 10*3/uL (ref 0.7–4.0)
MCH: 31.4 pg (ref 26.0–34.0)
MCHC: 34.4 g/dL (ref 30.0–36.0)
MCV: 91.3 fL (ref 80.0–100.0)
Monocytes Absolute: 0.8 10*3/uL (ref 0.1–1.0)
Monocytes Relative: 7 %
Neutro Abs: 7.6 10*3/uL (ref 1.7–7.7)
Neutrophils Relative %: 62 %
Platelets: 308 10*3/uL (ref 150–400)
RBC: 4.58 MIL/uL (ref 3.87–5.11)
RDW: 12.8 % (ref 11.5–15.5)
WBC: 12.2 10*3/uL — ABNORMAL HIGH (ref 4.0–10.5)
nRBC: 0 % (ref 0.0–0.2)

## 2022-08-09 LAB — COMPREHENSIVE METABOLIC PANEL
ALT: 21 U/L (ref 0–44)
AST: 26 U/L (ref 15–41)
Albumin: 4.8 g/dL (ref 3.5–5.0)
Alkaline Phosphatase: 46 U/L (ref 38–126)
Anion gap: 10 (ref 5–15)
BUN: 9 mg/dL (ref 6–20)
CO2: 18 mmol/L — ABNORMAL LOW (ref 22–32)
Calcium: 9.4 mg/dL (ref 8.9–10.3)
Chloride: 108 mmol/L (ref 98–111)
Creatinine, Ser: 0.93 mg/dL (ref 0.44–1.00)
GFR, Estimated: >60 mL/min (ref >60)
Glucose, Bld: 135 mg/dL — ABNORMAL HIGH (ref 70–99)
Potassium: 3 mmol/L — ABNORMAL LOW (ref 3.5–5.1)
Sodium: 136 mmol/L (ref 135–145)
Total Bilirubin: 0.8 mg/dL (ref 0.3–1.2)
Total Protein: 8.6 g/dL — ABNORMAL HIGH (ref 6.5–8.1)

## 2022-08-09 LAB — I-STAT BETA HCG BLOOD, ED (MC, WL, AP ONLY): I-stat hCG, quantitative: <5 m[IU]/mL (ref <5)

## 2022-08-09 NOTE — ED Triage Notes (Signed)
Patient c/o sudden onset diarrhea tonight. Reports after diarrhea patient started feeling anxious with increased respirations. Coached to slow breathing in triage.

## 2022-08-09 NOTE — ED Provider Triage Note (Signed)
Emergency Medicine Provider Triage Evaluation Note  Jackie Newman , a 31 y.o. female  was evaluated in triage.  Pt complains of nausea, vomiting, diarrhea and chills starting around 7 PM.  Patient reports history of the same.  No new sick contacts.  She developed generalized abdominal pain after symptoms began.  No urinary symptoms.  No previous abdominal surgeries.  Review of Systems  Positive: Nausea, vomiting, diarrhea, abdominal pain Negative: Fever  Physical Exam  BP (!) 140/97 (BP Location: Right Arm)   Pulse (!) 115   Temp 98.4 F (36.9 C)   Resp (!) 22   Ht 5\' 4"  (1.626 m)   Wt 81.6 kg   SpO2 100%   BMI 30.90 kg/m  Gen:   Awake, no distress, appears to be having rigors Resp:  Normal effort  MSK:   Moves extremities without difficulty  Other:  Mild generalized abdominal tenderness to palpation, no rebound or guarding; mild tachycardia; mild tachypnea  Medical Decision Making  Medically screening exam initiated at 8:25 PM.  Appropriate orders placed.  Sharrell Krawiec was informed that the remainder of the evaluation will be completed by another provider, this initial triage assessment does not replace that evaluation, and the importance of remaining in the ED until their evaluation is complete.     [name], PA-C 08/09/22 2032

## 2023-02-18 ENCOUNTER — Other Ambulatory Visit: Payer: Self-pay

## 2023-02-18 ENCOUNTER — Encounter (HOSPITAL_COMMUNITY): Payer: Self-pay

## 2023-02-18 ENCOUNTER — Emergency Department (HOSPITAL_COMMUNITY): Payer: Medicaid Other

## 2023-02-18 ENCOUNTER — Emergency Department (HOSPITAL_COMMUNITY)
Admission: EM | Admit: 2023-02-18 | Discharge: 2023-02-18 | Disposition: A | Discharge status: Home / Self Care | Payer: Medicaid Other | Attending: Emergency Medicine | Admitting: Emergency Medicine

## 2023-02-18 DIAGNOSIS — S62326A Displaced fracture of shaft of fifth metacarpal bone, right hand, initial encounter for closed fracture: Secondary | ICD-10-CM | POA: Diagnosis not present

## 2023-02-18 DIAGNOSIS — S62339A Displaced fracture of neck of unspecified metacarpal bone, initial encounter for closed fracture: Secondary | ICD-10-CM

## 2023-02-18 DIAGNOSIS — S6991XA Unspecified injury of right wrist, hand and finger(s), initial encounter: Secondary | ICD-10-CM | POA: Diagnosis present

## 2023-02-18 MED ORDER — HYDROCODONE-ACETAMINOPHEN 5-325 MG PO TABS
1.0000 | ORAL_TABLET | Freq: Once | ORAL | Status: AC
  Administered 2023-02-18: 1 via ORAL
  Filled 2023-02-18: qty 1

## 2023-02-18 MED ORDER — HYDROCODONE-ACETAMINOPHEN 5-325 MG PO TABS: 1.0000 | ORAL_TABLET | ORAL | 0 refills | Status: DC | PRN
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
## 2023-02-18 NOTE — Progress Notes (Signed)
Orthopedic Tech Progress Note Patient Details:  Jackie Newman Mar 14, 1991 [id number]  Well-padded plaster ulnar gutter splint applied to RUE. Motion and sensation of all digits remain intact.  Ortho Devices Type of Ortho Device: Ulna gutter splint Ortho Device/Splint Location: RUE Ortho Device/Splint Interventions: Ordered, Application, Adjustment   Post Interventions Patient Tolerated: Well Instructions Provided: Care of device, Adjustment of device  Jackie Newman [name] 02/18/2023, 6:07 PM

## 2023-12-05 ENCOUNTER — Encounter (HOSPITAL_COMMUNITY): Payer: Self-pay | Admitting: Emergency Medicine

## 2023-12-05 ENCOUNTER — Emergency Department (HOSPITAL_COMMUNITY)
Admission: EM | Admit: 2023-12-05 | Discharge: 2023-12-05 | Disposition: A | Discharge status: Home / Self Care | Payer: Medicaid Other | Attending: Emergency Medicine | Admitting: Emergency Medicine

## 2023-12-05 ENCOUNTER — Emergency Department (HOSPITAL_COMMUNITY): Payer: Medicaid Other

## 2023-12-05 DIAGNOSIS — M62830 Muscle spasm of back: Secondary | ICD-10-CM | POA: Insufficient documentation

## 2023-12-05 DIAGNOSIS — M546 Pain in thoracic spine: Secondary | ICD-10-CM | POA: Diagnosis present

## 2023-12-05 MED ORDER — HYDROCODONE-ACETAMINOPHEN 5-325 MG PO TABS: 1.0000 | ORAL_TABLET | ORAL | 0 refills | Status: AC | PRN

## 2023-12-05 MED ORDER — IBUPROFEN 200 MG PO TABS
600.0000 mg | ORAL_TABLET | Freq: Once | ORAL | Status: AC
  Administered 2023-12-05: 600 mg via ORAL
  Filled 2023-12-05: qty 3

## 2023-12-05 MED ORDER — IBUPROFEN 600 MG PO TABS: 600.0000 mg | ORAL_TABLET | Freq: Four times a day (QID) | ORAL | 0 refills | Status: AC | PRN

## 2023-12-05 MED ORDER — METHOCARBAMOL 750 MG PO TABS: 750.0000 mg | ORAL_TABLET | Freq: Four times a day (QID) | ORAL | 0 refills | Status: AC

## 2023-12-05 MED ORDER — ACETAMINOPHEN 500 MG PO TABS
1000.0000 mg | ORAL_TABLET | Freq: Once | ORAL | Status: AC
  Administered 2023-12-05: 1000 mg via ORAL
  Filled 2023-12-05: qty 2

## 2023-12-05 NOTE — ED Triage Notes (Signed)
 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 Sharp pain to left upper back. States she is unsure if she pulled a muscle.

## 2023-12-05 NOTE — Discharge Instructions (Signed)
 Take the medications as prescribed. Rest the muscle of the back as much as possible by being in a lying position.

## 2023-12-05 NOTE — ED Provider Notes (Signed)
 [place] EMERGENCY DEPARTMENT AT [facility] Provider Note   CSN: [id number] Arrival date & time: 12/05/23  1320     History  Chief Complaint  Patient presents with   Back Pain    Jackie Newman is a 33 y.o. female.  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 No fall or strain injury she is aware of. The pain was sudden in onset and now is sharp and grabbing with any movement or deep breath. No chest pain   Back Pain      Home Medications Prior to Admission medications   Medication Sig Start Date End Date Taking? Authorizing Provider  albuterol  (VENTOLIN  HFA) 108 (90 Base) MCG/ACT inhaler Inhale 1-2 puffs into the lungs every 6 (six) hours as needed for wheezing or shortness of breath. Patient taking differently: Inhale 2 puffs into the lungs every 6 (six) hours as needed for wheezing or shortness of breath. 05/02/20  Yes Fredirick Glenys RAMAN, MD  HYDROcodone -acetaminophen  (NORCO/VICODIN) 5-325 MG tablet Take 1 tablet by mouth every 4 (four) hours as needed. 12/05/23  Yes Susanne Baumgarner, Margit, PA-C  ibuprofen  (ADVIL ) 600 MG tablet Take 1 tablet (600 mg total) by mouth every 6 (six) hours as needed. 12/05/23  Yes Jameson Morrow, Margit, PA-C  methocarbamol  (ROBAXIN -750) 750 MG tablet Take 1 tablet (750 mg total) by mouth 4 (four) times daily. 12/05/23  Yes Odell Margit, PA-C      Allergies    Doxycycline     Review of Systems   Review of Systems  Musculoskeletal:  Positive for back pain.    Physical Exam Updated Vital Signs BP (!) 129/90 (BP Location: Left Arm)   Pulse (!) 105   Temp 99 F (37.2 C) (Oral)   Resp 18   Ht 5' 4 (1.626 m)   Wt 81 kg   LMP 11/16/2023   SpO2 100%   BMI 30.65 kg/m  Physical Exam  ED Results / Procedures / Treatments   Labs (all labs ordered are listed, but only abnormal results are displayed) Labs Reviewed - No data to display  EKG None  Radiology DG Chest 2 View Result Date:  12/05/2023 CLINICAL DATA:  Shortness of breath EXAM: CHEST - 2 VIEW COMPARISON:  Chest x-ray 12/18/2016 FINDINGS: The heart size and mediastinal contours are within normal limits. Both lungs are clear. The visualized skeletal structures are unremarkable. IMPRESSION: No active cardiopulmonary disease. Electronically Signed   By: Greig Pique M.D.   On: 12/05/2023 16:14    Procedures Procedures    Medications Ordered in ED Medications  ibuprofen  (ADVIL ) tablet 600 mg (600 mg Oral Given 12/05/23 1525)  acetaminophen  (TYLENOL ) tablet 1,000 mg (1,000 mg Oral Given 12/05/23 1525)    ED Course/ Medical Decision Making/ A&P Clinical Course as of 12/05/23 1624  Sun Dec 05, 2023  1623 Patient with sharp, stabbing pain to the upper right thoracic back, worse with movement. CXR w/o PTX. Suspect spasm of the back muscle.  [SU]    Clinical Course User Index [SU] Odell Margit, PA-C                                 Medical Decision Making Amount and/or Complexity of Data Reviewed Radiology: ordered.  Risk OTC drugs.           Final Clinical Impression(s) / ED Diagnoses Final diagnoses:  Muscle spasm of back  Rx / DC Orders ED Discharge Orders          Ordered    ibuprofen  (ADVIL ) 600 MG tablet  Every 6 hours PRN        12/05/23 1622    HYDROcodone -acetaminophen  (NORCO/VICODIN) 5-325 MG tablet  Every 4 hours PRN        12/05/23 1622    methocarbamol  (ROBAXIN -750) 750 MG tablet  4 times daily        12/05/23 1622              Odell Balls, PA-C 12/05/23 1624    Horton, Roxie HERO, DO 12/05/23 1745

## 2024-01-13 ENCOUNTER — Encounter (HOSPITAL_COMMUNITY): Payer: Self-pay

## 2024-01-13 ENCOUNTER — Other Ambulatory Visit: Payer: Self-pay

## 2024-01-13 ENCOUNTER — Emergency Department (HOSPITAL_COMMUNITY)
Admission: EM | Admit: 2024-01-13 | Discharge: 2024-01-14 | Disposition: A | Discharge status: Home / Self Care | Payer: Medicaid Other | Attending: Emergency Medicine | Admitting: Emergency Medicine

## 2024-01-13 DIAGNOSIS — R112 Nausea with vomiting, unspecified: Secondary | ICD-10-CM | POA: Diagnosis present

## 2024-01-13 DIAGNOSIS — F17210 Nicotine dependence, cigarettes, uncomplicated: Secondary | ICD-10-CM | POA: Diagnosis not present

## 2024-01-13 DIAGNOSIS — I10 Essential (primary) hypertension: Secondary | ICD-10-CM | POA: Diagnosis not present

## 2024-01-13 DIAGNOSIS — J45909 Unspecified asthma, uncomplicated: Secondary | ICD-10-CM | POA: Diagnosis not present

## 2024-01-13 DIAGNOSIS — R197 Diarrhea, unspecified: Secondary | ICD-10-CM | POA: Diagnosis not present

## 2024-01-13 LAB — CBC WITH DIFFERENTIAL/PLATELET
Abs Immature Granulocytes: 0.03 10*3/uL (ref 0.00–0.07)
Basophils Absolute: 0 10*3/uL (ref 0.0–0.1)
Basophils Relative: 0 %
Eosinophils Absolute: 0.1 10*3/uL (ref 0.0–0.5)
Eosinophils Relative: 1 %
HCT: 40.7 % (ref 36.0–46.0)
Hemoglobin: 14.1 g/dL (ref 12.0–15.0)
Immature Granulocytes: 0 %
Lymphocytes Relative: 18 %
Lymphs Abs: 1.8 10*3/uL (ref 0.7–4.0)
MCH: 31 pg (ref 26.0–34.0)
MCHC: 34.6 g/dL (ref 30.0–36.0)
MCV: 89.5 fL (ref 80.0–100.0)
Monocytes Absolute: 1.2 10*3/uL — ABNORMAL HIGH (ref 0.1–1.0)
Monocytes Relative: 11 %
Neutro Abs: 7.3 10*3/uL (ref 1.7–7.7)
Neutrophils Relative %: 70 %
Platelets: 340 10*3/uL (ref 150–400)
RBC: 4.55 MIL/uL (ref 3.87–5.11)
RDW: 13.2 % (ref 11.5–15.5)
WBC: 10.4 10*3/uL (ref 4.0–10.5)
nRBC: 0 % (ref 0.0–0.2)

## 2024-01-13 LAB — COMPREHENSIVE METABOLIC PANEL
ALT: 29 U/L (ref 0–44)
AST: 29 U/L (ref 15–41)
Albumin: 4.3 g/dL (ref 3.5–5.0)
Alkaline Phosphatase: 39 U/L (ref 38–126)
Anion gap: 15 (ref 5–15)
BUN: 9 mg/dL (ref 6–20)
CO2: 19 mmol/L — ABNORMAL LOW (ref 22–32)
Calcium: 9.6 mg/dL (ref 8.9–10.3)
Chloride: 106 mmol/L (ref 98–111)
Creatinine, Ser: 1.09 mg/dL — ABNORMAL HIGH (ref 0.44–1.00)
GFR, Estimated: >60 mL/min (ref >60)
Glucose, Bld: 103 mg/dL — ABNORMAL HIGH (ref 70–99)
Potassium: 3.5 mmol/L (ref 3.5–5.1)
Sodium: 140 mmol/L (ref 135–145)
Total Bilirubin: 0.6 mg/dL (ref 0.0–1.2)
Total Protein: 7.4 g/dL (ref 6.5–8.1)

## 2024-01-13 LAB — T4, FREE: Free T4: 1.11 ng/dL (ref 0.61–1.12)

## 2024-01-13 LAB — TSH: TSH: 3.463 u[IU]/mL (ref 0.350–4.500)

## 2024-01-13 LAB — MAGNESIUM: Magnesium: 1.7 mg/dL (ref 1.7–2.4)

## 2024-01-13 LAB — PREGNANCY, URINE: Preg Test, Ur: NEGATIVE

## 2024-01-13 MED ORDER — LACTATED RINGERS IV BOLUS
1000.0000 mL | Freq: Once | INTRAVENOUS | Status: AC
  Administered 2024-01-13: 1000 mL via INTRAVENOUS

## 2024-01-13 MED ORDER — ONDANSETRON HCL 4 MG/2ML IJ SOLN
4.0000 mg | Freq: Once | INTRAMUSCULAR | Status: DC
  Filled 2024-01-13: qty 2

## 2024-01-13 MED ORDER — MORPHINE SULFATE (PF) 4 MG/ML IV SOLN
4.0000 mg | Freq: Once | INTRAVENOUS | Status: AC
  Administered 2024-01-13: 4 mg via INTRAVENOUS
  Filled 2024-01-13: qty 1
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
## 2024-01-13 NOTE — ED Provider Notes (Deleted)
 Patient is a 33 year old female with a history of asthma, hypertension, seizure while pregnant who is not on any antiepileptic's who is presenting today due to concern for possible seizure however may also have had syncope.  She reports developing nausea vomiting diarrhea and abdominal pain around 3 PM today which has persisted.  She had just gotten done using the bathroom when she was sitting on her bed and reports she fell off the bed onto the floor.  Also reports hitting her head.  EMS reported patient was not ictal upon their arrival.  Suspect most likely that patient had a syncopal event related to recent nausea and vomiting most likely vasovagal.  She is having upper abdominal pain but no lower pain concerning for ectopic pregnancy, low suspicion for urinary pathology.  Suspect most likely viral etiology.  Patient given IV fluids, antiemetics and pain control.  CBC, urine pregnancy.  EKG normal today.   Gwyneth Sprout, MD 01/19/24 [phone]

## 2024-01-13 NOTE — ED Provider Notes (Signed)
 [place] EMERGENCY DEPARTMENT AT [facility] Provider Note  Arrival date/time:01/13/2024 9:54 PM  HPI/ROS   Jackie Newman is a 33 y.o. female with PMH significant for eclampsia during prior pregnancy who presents for seizure-like activity.  History is provided by patient and EMS. Patient was eating [employer] around 3 PM today after which she developed acute onset nausea, vomiting and diarrhea.  She endorses that the symptoms have been nonstop since 3 PM today. Within the last hour, her family witnessed her have seizure like activity and pass out.  Patient endorses that at this time, she was on her bed and fell forward, hitting her head on her bed railing.  When she woke up EMS was on scene. Patient does not have memory of seizure.  EMS reports that patient was not postictal on their arrival.  Patient has never had seizures in the past other than once during pregnancy when she had eclampsia. Denies chest pain, shortness of breath. Does endorse nausea, vomiting, diarrhea and abdominal cramps.  A complete ROS was performed with pertinent positives/negatives noted above.   ED Course and Medical Decision Making   I personally reviewed the patient's vitals.  Assessment/Plan: This is a previously healthy 33 year old patient who is presenting after seizure-like activity versus passing out. This is in the setting of recurrent nausea, vomiting and diarrhea following food ingestion. No reported postictal period.  My evaluation of patient, she is alert and oriented with no focal neurologic deficits.  She does appear generally unwell, however her vitals are largely reassuring.  Workup: An EKG shows normal sinus rhythm with normal QRS, QTc, and PR intervals. Urine pregnancy test is negative.  CBC shows no leukocytosis or anemia. CBC shows no sign of leukocytosis or anemia. CMP shows no electrolyte derangement, she does have a mild elevation in her creatinine to 1.09 from 0.9.   Magnesium is within normal limits. TSH and free T4 within normal limits.  Interventions: 1L LR  Favor that patient had a syncopal event in the setting of dehydration.  Patient remained in the department at the time that I departed and their workup was ongoing and care was signed out to the oncoming provider. Please see additional notes for documentation of their continued care.   Plan at time of handoff:  -administer Zofran and morphine -reassess -if improving, likely DC   Disposition: Pending reassessment  Clinical Impression:  1. Nausea vomiting and diarrhea     Rx / DC Orders ED Discharge Orders     None       The plan for this patient was discussed with Dr. Anitra Lauth, who voiced agreement and who oversaw evaluation and treatment of this patient.   Clinical Complexity A medically appropriate history, review of systems, and physical exam was performed.  If decision rules were used in this patient's evaluation, they are listed below.  Click here for ABCD2, HEART and other calculatorsREFRESH Note before signing   Patient's presentation is most consistent with acute presentation with potential threat to life or bodily function.  Medical Decision Making Amount and/or Complexity of Data Reviewed Labs: ordered.  Risk Prescription drug management.    Physical Exam and Medical History   Vitals:   01/13/24 2100 01/13/24 2105  BP: (!) 96/46   Pulse: 92   Resp: (!) 22   Temp: (!) 97.5 F (36.4 C)   TempSrc: Oral   SpO2: 100%   Weight:  90.7 kg  Height:  5\' 5"  (1.651 m)    Physical Exam  Vitals and nursing note reviewed.  Constitutional:      General: She is in acute distress.     Appearance: She is well-developed.     Comments: Shivering on exam  HENT:     Head: Normocephalic and atraumatic.  Eyes:     Conjunctiva/sclera: Conjunctivae normal.  Cardiovascular:     Rate and Rhythm: Normal rate and regular rhythm.     Heart sounds: No murmur  heard. Pulmonary:     Effort: Pulmonary effort is normal. No respiratory distress.     Breath sounds: Normal breath sounds.  Abdominal:     Palpations: Abdomen is soft.  Musculoskeletal:        General: No swelling.     Cervical back: Neck supple.  Skin:    General: Skin is warm and dry.     Capillary Refill: Capillary refill takes less than 2 seconds.  Neurological:     General: No focal deficit present.     Mental Status: She is alert and oriented to person, place, and time.     Cranial Nerves: No cranial nerve deficit.     Sensory: No sensory deficit.     Motor: No weakness.  Psychiatric:        Mood and Affect: Mood normal.     Medical History: Allergies  Allergen Reactions   Doxycycline Nausea And Vomiting   Past Medical History:  Diagnosis Date   Asthma    last used inhaler a month ago   Congenital umbilical hernia    Hypertension    Ovarian cyst    Seizures (HCC)    No follow up. Patient stated she had one seizure during her 23st birthday drinking alcohol.    Past Surgical History:  Procedure Laterality Date   COLONOSCOPY WITH ESOPHAGOGASTRODUODENOSCOPY (EGD)  12/04/2019   Family History  Problem Relation Age of Onset   Asthma Father    Gout Father     Social History   Tobacco Use   Smoking status: Every Day    Current packs/day: 6.00    Types: Cigars, Cigarettes    Passive exposure: Yes   Smokeless tobacco: Never  Vaping Use   Vaping status: Never Used  Substance Use Topics   Alcohol use: No    Comment: occasional socially   Drug use: Not Currently    Types: Marijuana    Comment: quit 3-4 weeks ago    Procedures   If procedures were preformed on this patient, they are listed below:  Procedures   -------- HPI and MDM generated using voice dictation software and may contain dictation errors. Please contact me for any clarification or with any questions.   Cephus Slater, MD Emergency Medicine PGY-2    Caron Presume,  MD 01/13/24 1610    Gwyneth Sprout, MD 01/19/24 [place] Manners

## 2024-01-14 MED ORDER — PANTOPRAZOLE SODIUM 40 MG IV SOLR
40.0000 mg | Freq: Once | INTRAVENOUS | Status: AC
  Administered 2024-01-14: 40 mg via INTRAVENOUS
  Filled 2024-01-14: qty 10

## 2024-01-14 MED ORDER — DICYCLOMINE HCL 10 MG/ML IM SOLN
20.0000 mg | Freq: Once | INTRAMUSCULAR | Status: DC
  Filled 2024-01-14: qty 2

## 2024-01-14 MED ORDER — ONDANSETRON 4 MG PO TBDP: 4.0000 mg | ORAL_TABLET | Freq: Three times a day (TID) | ORAL | 0 refills | Status: AC | PRN

## 2024-01-14 MED ORDER — DICYCLOMINE HCL 10 MG PO CAPS
10.0000 mg | ORAL_CAPSULE | Freq: Once | ORAL | Status: AC
  Administered 2024-01-14: 10 mg via ORAL
  Filled 2024-01-14: qty 1

## 2024-01-14 MED ORDER — FAMOTIDINE IN NACL 20-0.9 MG/50ML-% IV SOLN
20.0000 mg | Freq: Once | INTRAVENOUS | Status: AC
  Administered 2024-01-14: 20 mg via INTRAVENOUS
  Filled 2024-01-14: qty 50

## 2024-01-14 NOTE — ED Provider Notes (Signed)
  Physical Exam  BP 91/64   Pulse 64   Temp (!) 97.5 F (36.4 C) (Oral)   Resp 18   Ht 5\' 5"  (1.651 m)   Wt 90.7 kg   SpO2 100%   BMI 33.28 kg/m   Physical Exam  Procedures  Procedures  ED Course / MDM    Medical Decision Making Amount and/or Complexity of Data Reviewed Labs: ordered.  Risk Prescription drug management.   Patient care assumed at signout from Dr. Craige Cotta.  See her note for full details.  In short, patient with history of asthma, hypertension, seizure while pregnant presented complaining of possible near syncope.  She reports nausea, vomiting, diarrhea, and abdominal pain beginning at 3 PM today after eating at a restaurant.  Pain was reported as being epigastric in nature.  At the time of my assumption of care patient had been administered a fluid bolus and morphine was ordered for pain control.  Plan to reassess after pain medicine for possible disposition  Patient continued to have abdominal pain after morphine.  Was administered Protonix, Pepcid, and Bentyl.  Patient voices a moderate improvement in symptoms.  She denies any abdominal tenderness on physical exam.  At this time I see no indication for further emergent workup.  Labs are grossly unremarkable.  Plan to discharge patient home with recommendations for supportive care and return precautions.       Darrick Grinder, PA-C 01/14/24 1610    Gwyneth Sprout, MD 01/19/24 [phone]

## 2024-01-14 NOTE — Discharge Instructions (Signed)
Your lab work and exam tonight were reassuring.  I have prescribed nausea medication to help with symptoms as needed.  You may take over-the-counter medication such as Imodium for diarrhea.  If you develop any life-threatening symptoms please return to the emergency department.

## 2024-03-12 ENCOUNTER — Emergency Department (HOSPITAL_COMMUNITY)

## 2024-03-12 ENCOUNTER — Other Ambulatory Visit: Payer: Self-pay

## 2024-03-12 ENCOUNTER — Encounter (HOSPITAL_COMMUNITY): Payer: Self-pay | Admitting: Emergency Medicine

## 2024-03-12 ENCOUNTER — Emergency Department (HOSPITAL_COMMUNITY)
Admission: EM | Admit: 2024-03-12 | Discharge: 2024-03-12 | Disposition: A | Discharge status: Home / Self Care | Attending: Emergency Medicine | Admitting: Emergency Medicine

## 2024-03-12 DIAGNOSIS — I10 Essential (primary) hypertension: Secondary | ICD-10-CM | POA: Insufficient documentation

## 2024-03-12 DIAGNOSIS — R197 Diarrhea, unspecified: Secondary | ICD-10-CM | POA: Insufficient documentation

## 2024-03-12 DIAGNOSIS — E876 Hypokalemia: Secondary | ICD-10-CM | POA: Diagnosis not present

## 2024-03-12 DIAGNOSIS — D72829 Elevated white blood cell count, unspecified: Secondary | ICD-10-CM | POA: Insufficient documentation

## 2024-03-12 DIAGNOSIS — R1013 Epigastric pain: Secondary | ICD-10-CM | POA: Insufficient documentation

## 2024-03-12 DIAGNOSIS — R112 Nausea with vomiting, unspecified: Secondary | ICD-10-CM | POA: Insufficient documentation

## 2024-03-12 DIAGNOSIS — J4521 Mild intermittent asthma with (acute) exacerbation: Secondary | ICD-10-CM

## 2024-03-12 DIAGNOSIS — J45901 Unspecified asthma with (acute) exacerbation: Secondary | ICD-10-CM | POA: Diagnosis not present

## 2024-03-12 DIAGNOSIS — R0602 Shortness of breath: Secondary | ICD-10-CM | POA: Diagnosis present

## 2024-03-12 LAB — CBC
HCT: 39.9 % (ref 36.0–46.0)
Hemoglobin: 13.7 g/dL (ref 12.0–15.0)
MCH: 31.1 pg (ref 26.0–34.0)
MCHC: 34.3 g/dL (ref 30.0–36.0)
MCV: 90.5 fL (ref 80.0–100.0)
Platelets: 303 10*3/uL (ref 150–400)
RBC: 4.41 MIL/uL (ref 3.87–5.11)
RDW: 12.6 % (ref 11.5–15.5)
WBC: 12.4 10*3/uL — ABNORMAL HIGH (ref 4.0–10.5)
nRBC: 0 % (ref 0.0–0.2)

## 2024-03-12 LAB — URINALYSIS, ROUTINE W REFLEX MICROSCOPIC
Bilirubin Urine: NEGATIVE
Glucose, UA: NEGATIVE mg/dL
Hgb urine dipstick: NEGATIVE
Ketones, ur: 5 mg/dL — AB
Leukocytes,Ua: NEGATIVE
Nitrite: NEGATIVE
Protein, ur: NEGATIVE mg/dL
Specific Gravity, Urine: 1.004 — ABNORMAL LOW (ref 1.005–1.030)
pH: 8 (ref 5.0–8.0)

## 2024-03-12 LAB — COMPREHENSIVE METABOLIC PANEL WITH GFR
ALT: 25 U/L (ref 0–44)
AST: 29 U/L (ref 15–41)
Albumin: 4.7 g/dL (ref 3.5–5.0)
Alkaline Phosphatase: 41 U/L (ref 38–126)
Anion gap: 12 (ref 5–15)
BUN: 7 mg/dL (ref 6–20)
CO2: 19 mmol/L — ABNORMAL LOW (ref 22–32)
Calcium: 9.5 mg/dL (ref 8.9–10.3)
Chloride: 105 mmol/L (ref 98–111)
Creatinine, Ser: 0.93 mg/dL (ref 0.44–1.00)
GFR, Estimated: >60 mL/min (ref >60)
Glucose, Bld: 132 mg/dL — ABNORMAL HIGH (ref 70–99)
Potassium: 3.4 mmol/L — ABNORMAL LOW (ref 3.5–5.1)
Sodium: 136 mmol/L (ref 135–145)
Total Bilirubin: 0.6 mg/dL (ref 0.0–1.2)
Total Protein: 8 g/dL (ref 6.5–8.1)

## 2024-03-12 LAB — LIPASE, BLOOD: Lipase: 22 U/L (ref 11–51)

## 2024-03-12 LAB — RESP PANEL BY RT-PCR (RSV, FLU A&B, COVID)  RVPGX2
Influenza A by PCR: NEGATIVE
Influenza B by PCR: NEGATIVE
Resp Syncytial Virus by PCR: NEGATIVE
SARS Coronavirus 2 by RT PCR: NEGATIVE

## 2024-03-12 LAB — PREGNANCY, URINE: Preg Test, Ur: NEGATIVE

## 2024-03-12 LAB — TROPONIN I (HIGH SENSITIVITY)
Troponin I (High Sensitivity): 2 ng/L (ref <18)
Troponin I (High Sensitivity): 2 ng/L (ref <18)

## 2024-03-12 MED ORDER — MORPHINE SULFATE (PF) 4 MG/ML IV SOLN
4.0000 mg | Freq: Once | INTRAVENOUS | Status: AC
  Administered 2024-03-12: 4 mg via INTRAVENOUS
  Filled 2024-03-12: qty 1

## 2024-03-12 MED ORDER — SODIUM CHLORIDE 0.9 % IV BOLUS
1000.0000 mL | Freq: Once | INTRAVENOUS | Status: AC
  Administered 2024-03-12: 1000 mL via INTRAVENOUS

## 2024-03-12 MED ORDER — SODIUM CHLORIDE 0.9 % IV SOLN: 25.0000 mg | Freq: Four times a day (QID) | INTRAVENOUS | Status: DC | PRN

## 2024-03-12 MED ORDER — ALBUTEROL SULFATE (2.5 MG/3ML) 0.083% IN NEBU
2.5000 mg | INHALATION_SOLUTION | Freq: Once | RESPIRATORY_TRACT | Status: AC
  Administered 2024-03-12: 2.5 mg via RESPIRATORY_TRACT
  Filled 2024-03-12: qty 3

## 2024-03-12 MED ORDER — PROMETHAZINE HCL 25 MG RE SUPP
25.0000 mg | Freq: Four times a day (QID) | RECTAL | 0 refills | Status: AC | PRN
Start: 2024-03-12 — End: ?

## 2024-03-12 MED ORDER — IOHEXOL 300 MG/ML  SOLN
100.0000 mL | Freq: Once | INTRAMUSCULAR | Status: AC | PRN
  Administered 2024-03-12: 100 mL via INTRAVENOUS

## 2024-03-12 MED ORDER — SODIUM CHLORIDE 0.9 % IV SOLN
25.0000 mg | Freq: Once | INTRAVENOUS | Status: AC
  Administered 2024-03-12: 25 mg via INTRAVENOUS
  Filled 2024-03-12: qty 25

## 2024-03-12 NOTE — ED Notes (Addendum)
 Christian Prosperi, PA give orders for albuterol treatment to be given.

## 2024-03-12 NOTE — ED Provider Notes (Signed)
 [place] EMERGENCY DEPARTMENT AT [facility] Provider Note   CSN: [id number] Arrival date & time: 03/12/24  1846     History  Chief Complaint  Patient presents with   Shortness of Breath    Jackie Newman is a 33 y.o. female with past medical history significant for asthma, hypertension, seizures who presents concern for nausea, vomiting, diarrhea, asthma attack.  Symptoms started around 1 hour prior to arrival, she reports that her abdominal pain however is chronic in nature, she has been having these episodes intermittently for 4 to 5 years.  She denies any alcohol, drug use.  She reports that she often has to take nausea suppositories.    Shortness of Breath Associated symptoms: vomiting        Home Medications Prior to Admission medications   Medication Sig Start Date End Date Taking? Authorizing Provider  albuterol (VENTOLIN HFA) 108 (90 Base) MCG/ACT inhaler Inhale 1-2 puffs into the lungs every 6 (six) hours as needed for wheezing or shortness of breath. Patient taking differently: Inhale 2 puffs into the lungs every 6 (six) hours as needed for wheezing or shortness of breath. 05/02/20   Pratt, Tanya S, MD  HYDROcodone-acetaminophen (NORCO/VICODIN) 5-325 MG tablet Take 1 tablet by mouth every 4 (four) hours as needed. 12/05/23   Mandy Second, PA-C  ibuprofen (ADVIL) 600 MG tablet Take 1 tablet (600 mg total) by mouth every 6 (six) hours as needed. 12/05/23   Mandy Second, PA-C  methocarbamol (ROBAXIN-750) 750 MG tablet Take 1 tablet (750 mg total) by mouth 4 (four) times daily. 12/05/23   Mandy Second, PA-C  ondansetron (ZOFRAN-ODT) 4 MG disintegrating tablet Take 1 tablet (4 mg total) by mouth every 8 (eight) hours as needed for nausea or vomiting. 01/14/24   Elisa Guest, PA-C      Allergies    Doxycycline    Review of Systems   Review of Systems  Respiratory:  Positive for shortness of breath.   Gastrointestinal:  Positive for diarrhea, nausea and  vomiting.  All other systems reviewed and are negative.   Physical Exam Updated Vital Signs BP 121/76 (BP Location: Right Arm)   Pulse 99   Temp 98 F (36.7 C) (Oral)   Resp (!) 22   Ht 5\' 5"  (1.651 m)   Wt 90.7 kg   LMP 02/14/2024   SpO2 100%   BMI 33.28 kg/m  Physical Exam Vitals and nursing note reviewed.  Constitutional:      General: She is not in acute distress.    Appearance: Normal appearance.  HENT:     Head: Normocephalic and atraumatic.  Eyes:     General:        Right eye: No discharge.        Left eye: No discharge.  Cardiovascular:     Rate and Rhythm: Normal rate and regular rhythm.     Heart sounds: No murmur heard.    No friction rub. No gallop.  Pulmonary:     Effort: Pulmonary effort is normal.     Breath sounds: Normal breath sounds.  Abdominal:     General: Bowel sounds are normal.     Palpations: Abdomen is soft.     Comments: Significant epigastric ttp. Negative murphy sign. No rebound, rigidity, some guarding which is resolved on reassessment  Skin:    General: Skin is warm and dry.     Capillary Refill: Capillary refill takes less than 2 seconds.  Neurological:  Mental Status: She is alert and oriented to person, place, and time.  Psychiatric:        Mood and Affect: Mood normal.        Behavior: Behavior normal.    ED Results / Procedures / Treatments   Labs (all labs ordered are listed, but only abnormal results are displayed) Labs Reviewed  CBC - Abnormal; Notable for the following components:      Result Value   WBC 12.4 (*)    All other components within normal limits  COMPREHENSIVE METABOLIC PANEL WITH GFR - Abnormal; Notable for the following components:   Potassium 3.4 (*)    CO2 19 (*)    Glucose, Bld 132 (*)    All other components within normal limits  RESP PANEL BY RT-PCR (RSV, FLU A&B, COVID)  RVPGX2  LIPASE, BLOOD  PREGNANCY, URINE  URINALYSIS, ROUTINE W REFLEX MICROSCOPIC  TROPONIN I (HIGH SENSITIVITY)   TROPONIN I (HIGH SENSITIVITY)    EKG None  Radiology DG Chest 2 View Result Date: 03/12/2024 CLINICAL DATA:  Chest pain EXAM: CHEST - 2 VIEW COMPARISON:  12/05/2023 FINDINGS: The heart size and mediastinal contours are within normal limits. Both lungs are clear. The visualized skeletal structures are unremarkable. IMPRESSION: No active cardiopulmonary disease. Electronically Signed   By: Violeta Grey M.D.   On: 03/12/2024 19:54    Procedures Procedures    Medications Ordered in ED Medications  sodium chloride 0.9 % bolus 1,000 mL (has no administration in time range)  morphine (PF) 4 MG/ML injection 4 mg (has no administration in time range)  promethazine (PHENERGAN) 25 mg in sodium chloride 0.9 % 50 mL IVPB (has no administration in time range)  albuterol (PROVENTIL) (2.5 MG/3ML) 0.083% nebulizer solution 2.5 mg (2.5 mg Nebulization Given 03/12/24 1858)    ED Course/ Medical Decision Making/ A&P                                 Medical Decision Making Amount and/or Complexity of Data Reviewed Labs: ordered. Radiology: ordered.  Risk Prescription drug management.   This patient is a 33 y.o. female  who presents to the ED for concern of shob, abdominal pain.   Differential diagnoses prior to evaluation: The emergent differential diagnosis includes, but is not limited to,  asthma exacerbation, COPD exacerbation, acute upper respiratory infection, acute bronchitis, chronic bronchitis, interstitial lung disease, ARDS, PE, pneumonia, atypical ACS, carbon monoxide poisoning, spontaneous pneumothorax, new CHF vs CHF exacerbation, versus other, The causes of generalized abdominal pain include but are not limited to AAA, mesenteric ischemia, appendicitis, diverticulitis, DKA, gastritis, gastroenteritis, AMI, nephrolithiasis, pancreatitis, peritonitis, adrenal insufficiency,lead poisoning, iron toxicity, intestinal ischemia, constipation, UTI,SBO/LBO, splenic rupture, biliary disease,  IBD, IBS, PUD, or hepatitis . This is not an exhaustive differential.   Past Medical History / Co-morbidities / Social History: asthma, hypertension, seizures   Physical Exam: Physical exam performed. The pertinent findings include: Arrives with some tachypnea, reportedly respiratory rate of 35, this was not witnessed by the provider, she had very mild tachypnea, respiratory rate of 22 on ED evaluation, no accessory breath sounds.  She has some diffuse tenderness throughout the abdomen, most focally in the epigastric region, no rebound, rigidity, guarding.  Lab Tests/Imaging studies: I personally interpreted labs/imaging and the pertinent results include: UA with some ketones suggestive of dehydration, otherwise unremarkable, RVP normal, CMP with mild bicarb deficit, CO2 19, mild hypokalemia potassium 3.4, she does  have a leukocytosis, white blood cells 12.4, normal troponin, negative lipase.  I independently interpreted CT abdomen pelvis with contrast complaint of chest x-ray which shows no evidence of acute intrathoracic abnormality, no intra-abdominal abnormality to explain her symptoms.. I agree with the radiologist interpretation.  Cardiac monitoring: EKG obtained and interpreted by myself and attending physician which shows: Normal sinus rhythm, borderline T wave abnormalities, no acute ST changes   Medications: I ordered medication including Phenergan, morphine, albuterol, fluid bolus, on reassessment patient feeling improved, tolerating p.o.  I have reviewed the patients home medicines and have made adjustments as needed.   Disposition: After consideration of the diagnostic results and the patients response to treatment, I feel that patient stable for discharge, discharged with Phenergan suppository for nausea,.   emergency department workup does not suggest an emergent condition requiring admission or immediate intervention beyond what has been performed at this time. The plan is: as  above. The patient is safe for discharge and has been instructed to return immediately for worsening symptoms, change in symptoms or any other concerns.  Final Clinical Impression(s) / ED Diagnoses Final diagnoses:  None    Rx / DC Orders ED Discharge Orders     None         Stefan Edge 03/12/24 2333    Hershel Los, MD 03/13/24 1626

## 2024-03-12 NOTE — ED Triage Notes (Addendum)
 Patient comes in for asthma attack started after work approximately 1 hour. No inhaler on her at this time.  Nausea and vomiting today as well

## 2024-03-12 NOTE — Discharge Instructions (Signed)
 You can use the nausea medication that I prescribed as needed, please follow-up with your GI doctor for further evaluation if your symptoms persist.

## 2024-08-22 ENCOUNTER — Emergency Department (HOSPITAL_COMMUNITY)
Admission: EM | Admit: 2024-08-22 | Discharge: 2024-08-23 | Disposition: A | Discharge status: Home / Self Care | Source: Home / Self Care | Attending: Emergency Medicine | Admitting: Emergency Medicine

## 2024-08-22 ENCOUNTER — Other Ambulatory Visit: Payer: Self-pay

## 2024-08-22 ENCOUNTER — Encounter (HOSPITAL_COMMUNITY): Payer: Self-pay

## 2024-08-22 ENCOUNTER — Emergency Department (HOSPITAL_COMMUNITY)

## 2024-08-22 ENCOUNTER — Emergency Department (HOSPITAL_COMMUNITY)
Admission: EM | Admit: 2024-08-22 | Discharge: 2024-08-22 | Disposition: A | Discharge status: Home / Self Care | Attending: Emergency Medicine | Admitting: Emergency Medicine

## 2024-08-22 DIAGNOSIS — R112 Nausea with vomiting, unspecified: Secondary | ICD-10-CM | POA: Insufficient documentation

## 2024-08-22 DIAGNOSIS — R197 Diarrhea, unspecified: Secondary | ICD-10-CM | POA: Insufficient documentation

## 2024-08-22 DIAGNOSIS — R1110 Vomiting, unspecified: Secondary | ICD-10-CM | POA: Diagnosis present

## 2024-08-22 DIAGNOSIS — F12188 Cannabis abuse with other cannabis-induced disorder: Secondary | ICD-10-CM | POA: Insufficient documentation

## 2024-08-22 DIAGNOSIS — J45909 Unspecified asthma, uncomplicated: Secondary | ICD-10-CM | POA: Insufficient documentation

## 2024-08-22 DIAGNOSIS — R109 Unspecified abdominal pain: Secondary | ICD-10-CM

## 2024-08-22 DIAGNOSIS — F1290 Cannabis use, unspecified, uncomplicated: Secondary | ICD-10-CM

## 2024-08-22 DIAGNOSIS — R1084 Generalized abdominal pain: Secondary | ICD-10-CM | POA: Insufficient documentation

## 2024-08-22 DIAGNOSIS — I10 Essential (primary) hypertension: Secondary | ICD-10-CM | POA: Insufficient documentation

## 2024-08-22 LAB — CBC
HCT: 40.8 % (ref 36.0–46.0)
HCT: 41.6 % (ref 36.0–46.0)
Hemoglobin: 13.7 g/dL (ref 12.0–15.0)
Hemoglobin: 13.7 g/dL (ref 12.0–15.0)
MCH: 30.4 pg (ref 26.0–34.0)
MCH: 29.8 pg (ref 26.0–34.0)
MCHC: 33.6 g/dL (ref 30.0–36.0)
MCHC: 32.9 g/dL (ref 30.0–36.0)
MCV: 90.5 fL (ref 80.0–100.0)
MCV: 90.4 fL (ref 80.0–100.0)
Platelets: 310 K/uL (ref 150–400)
Platelets: 322 K/uL (ref 150–400)
RBC: 4.51 MIL/uL (ref 3.87–5.11)
RBC: 4.6 MIL/uL (ref 3.87–5.11)
RDW: 12.8 % (ref 11.5–15.5)
RDW: 12.9 % (ref 11.5–15.5)
WBC: 9.5 K/uL (ref 4.0–10.5)
WBC: 9.3 K/uL (ref 4.0–10.5)
nRBC: 0 % (ref 0.0–0.2)
nRBC: 0 % (ref 0.0–0.2)

## 2024-08-22 LAB — URINALYSIS, ROUTINE W REFLEX MICROSCOPIC
Bilirubin Urine: NEGATIVE
Bilirubin Urine: NEGATIVE
Glucose, UA: NEGATIVE mg/dL
Glucose, UA: NEGATIVE mg/dL
Hgb urine dipstick: NEGATIVE
Hgb urine dipstick: NEGATIVE
Ketones, ur: NEGATIVE mg/dL
Ketones, ur: 40 mg/dL — AB
Leukocytes,Ua: NEGATIVE
Leukocytes,Ua: NEGATIVE
Nitrite: NEGATIVE
Nitrite: NEGATIVE
Protein, ur: NEGATIVE mg/dL
Protein, ur: NEGATIVE mg/dL
Specific Gravity, Urine: 1.008 (ref 1.005–1.030)
Specific Gravity, Urine: <1.005 — ABNORMAL LOW (ref 1.005–1.030)
pH: 8 (ref 5.0–8.0)
pH: 6 (ref 5.0–8.0)

## 2024-08-22 LAB — COMPREHENSIVE METABOLIC PANEL WITH GFR
ALT: 70 U/L — ABNORMAL HIGH (ref 0–44)
ALT: 55 U/L — ABNORMAL HIGH (ref 0–44)
AST: 46 U/L — ABNORMAL HIGH (ref 15–41)
AST: 32 U/L (ref 15–41)
Albumin: 4.5 g/dL (ref 3.5–5.0)
Albumin: 4.6 g/dL (ref 3.5–5.0)
Alkaline Phosphatase: 65 U/L (ref 38–126)
Alkaline Phosphatase: 49 U/L (ref 38–126)
Anion gap: 18 — ABNORMAL HIGH (ref 5–15)
Anion gap: 18 — ABNORMAL HIGH (ref 5–15)
BUN: 9 mg/dL (ref 6–20)
BUN: 8 mg/dL (ref 6–20)
CO2: 16 mmol/L — ABNORMAL LOW (ref 22–32)
CO2: 15 mmol/L — ABNORMAL LOW (ref 22–32)
Calcium: 9.3 mg/dL (ref 8.9–10.3)
Calcium: 9.6 mg/dL (ref 8.9–10.3)
Chloride: 104 mmol/L (ref 98–111)
Chloride: 104 mmol/L (ref 98–111)
Creatinine, Ser: 1.12 mg/dL — ABNORMAL HIGH (ref 0.44–1.00)
Creatinine, Ser: 0.95 mg/dL (ref 0.44–1.00)
GFR, Estimated: >60 mL/min (ref >60)
GFR, Estimated: >60 mL/min (ref >60)
Glucose, Bld: 134 mg/dL — ABNORMAL HIGH (ref 70–99)
Glucose, Bld: 109 mg/dL — ABNORMAL HIGH (ref 70–99)
Potassium: 3.5 mmol/L (ref 3.5–5.1)
Potassium: 3.5 mmol/L (ref 3.5–5.1)
Sodium: 137 mmol/L (ref 135–145)
Sodium: 136 mmol/L (ref 135–145)
Total Bilirubin: 0.4 mg/dL (ref 0.0–1.2)
Total Bilirubin: 0.6 mg/dL (ref 0.0–1.2)
Total Protein: 7.6 g/dL (ref 6.5–8.1)
Total Protein: 7.8 g/dL (ref 6.5–8.1)

## 2024-08-22 LAB — LIPASE, BLOOD
Lipase: 19 U/L (ref 11–51)
Lipase: 14 U/L (ref 11–51)

## 2024-08-22 LAB — HCG, SERUM, QUALITATIVE: Preg, Serum: NEGATIVE

## 2024-08-22 MED ORDER — ONDANSETRON HCL 4 MG/2ML IJ SOLN
4.0000 mg | Freq: Once | INTRAMUSCULAR | Status: AC
  Administered 2024-08-22: 4 mg via INTRAVENOUS
  Filled 2024-08-22: qty 2

## 2024-08-22 MED ORDER — ONDANSETRON 4 MG PO TBDP
4.0000 mg | ORAL_TABLET | Freq: Once | ORAL | Status: AC | PRN
  Administered 2024-08-22: 4 mg via ORAL
  Filled 2024-08-22: qty 1

## 2024-08-22 MED ORDER — DICYCLOMINE HCL 20 MG PO TABS: 20.0000 mg | ORAL_TABLET | Freq: Two times a day (BID) | ORAL | 0 refills | Status: AC

## 2024-08-22 MED ORDER — MORPHINE SULFATE (PF) 4 MG/ML IV SOLN
4.0000 mg | Freq: Once | INTRAVENOUS | Status: AC
  Administered 2024-08-22: 4 mg via INTRAVENOUS
  Filled 2024-08-22: qty 1

## 2024-08-22 MED ORDER — ONDANSETRON 4 MG PO TBDP: 4.0000 mg | ORAL_TABLET | Freq: Three times a day (TID) | ORAL | 0 refills | Status: AC | PRN

## 2024-08-22 MED ORDER — SODIUM CHLORIDE 0.9 % IV BOLUS
1000.0000 mL | Freq: Once | INTRAVENOUS | Status: AC
  Administered 2024-08-22: 1000 mL via INTRAVENOUS

## 2024-08-22 MED ORDER — ONDANSETRON 4 MG PO TBDP: 4.0000 mg | ORAL_TABLET | Freq: Once | ORAL | Status: DC | PRN

## 2024-08-22 MED ORDER — FENTANYL CITRATE PF 50 MCG/ML IJ SOSY
50.0000 ug | PREFILLED_SYRINGE | Freq: Once | INTRAMUSCULAR | Status: AC
  Administered 2024-08-22: 50 ug via INTRAVENOUS
  Filled 2024-08-22: qty 1

## 2024-08-22 MED ORDER — DICYCLOMINE HCL 10 MG PO CAPS
20.0000 mg | ORAL_CAPSULE | Freq: Once | ORAL | Status: AC
  Administered 2024-08-22: 20 mg via ORAL
  Filled 2024-08-22: qty 2

## 2024-08-22 MED ORDER — IOHEXOL 300 MG/ML  SOLN
100.0000 mL | Freq: Once | INTRAMUSCULAR | Status: AC | PRN
  Administered 2024-08-22: 100 mL via INTRAVENOUS

## 2024-08-22 NOTE — ED Triage Notes (Signed)
 Pt returns for evaluation for ongoing abdominal pain associated with NVD that started yesterday night. Since discharge - now reports chills.

## 2024-08-22 NOTE — Discharge Instructions (Signed)
 Today you were seen for abdominal pain with nausea, vomiting, and diarrhea.  Your workup in the emergency department was reassuring.  Please pick up your medication and take as prescribed.  You may also take Imodium sparingly as needed for diarrhea.  Thank you for letting us  treat you today. After reviewing your labs and imaging, I feel you are safe to go home. Please follow up with your PCP in the next several days and provide them with your records from this visit. Return to the Emergency Room if pain becomes severe or symptoms worsen.

## 2024-08-22 NOTE — ED Provider Notes (Signed)
 [place] EMERGENCY DEPARTMENT AT [facility] Provider Note   CSN: [id number] Arrival date & time: 08/22/24  0041     Patient presents with: Emesis   Jackie Newman is a 33 y.o. female.  Presents today for nausea, vomiting, and diarrhea with generalized abdominal pain after eating chicken gizzards and a milkshake.  Patient placed a promethazine  suppository around 2245.  Patient denies fever, chills, chest pain, shortness of breath, hematemesis, urinary symptoms, or blood in stool.    Emesis Associated symptoms: abdominal pain and diarrhea        Prior to Admission medications   Medication Sig Start Date End Date Taking? Authorizing Provider  dicyclomine  (BENTYL ) 20 MG tablet Take 1 tablet (20 mg total) by mouth 2 (two) times daily. 08/22/24  Yes Keilen Kahl N, PA-C  ondansetron  (ZOFRAN -ODT) 4 MG disintegrating tablet Take 1 tablet (4 mg total) by mouth every 8 (eight) hours as needed for nausea or vomiting. 08/22/24  Yes Francis Ileana SAILOR, PA-C  albuterol  (VENTOLIN  HFA) 108 (90 Base) MCG/ACT inhaler Inhale 1-2 puffs into the lungs every 6 (six) hours as needed for wheezing or shortness of breath. Patient taking differently: Inhale 2 puffs into the lungs every 6 (six) hours as needed for wheezing or shortness of breath. 05/02/20   Fredirick Glenys RAMAN, MD  HYDROcodone -acetaminophen  (NORCO/VICODIN) 5-325 MG tablet Take 1 tablet by mouth every 4 (four) hours as needed. 12/05/23   Odell Balls, PA-C  ibuprofen  (ADVIL ) 600 MG tablet Take 1 tablet (600 mg total) by mouth every 6 (six) hours as needed. 12/05/23   Odell Balls, PA-C  methocarbamol  (ROBAXIN -750) 750 MG tablet Take 1 tablet (750 mg total) by mouth 4 (four) times daily. 12/05/23   Odell Balls, PA-C  ondansetron  (ZOFRAN -ODT) 4 MG disintegrating tablet Take 1 tablet (4 mg total) by mouth every 8 (eight) hours as needed for nausea or vomiting. 01/14/24   Logan Ubaldo NOVAK, PA-C  promethazine  (PHENERGAN ) 25 MG suppository Place 1  suppository (25 mg total) rectally every 6 (six) hours as needed for nausea or vomiting. 03/12/24   Prosperi, Christian H, PA-C    Allergies: Doxycycline     Review of Systems  Gastrointestinal:  Positive for abdominal pain, diarrhea, nausea and vomiting.    Updated Vital Signs BP 109/69 (BP Location: Left Arm)   Pulse 100   Temp 98.2 F (36.8 C) (Oral)   Resp 19   LMP 07/31/2024 Comment: negative HCG 08/22/24  SpO2 100%   Physical Exam Vitals and nursing note reviewed.  Constitutional:      General: She is not in acute distress.    Appearance: Normal appearance. She is well-developed. She is not ill-appearing.     Comments: Uncomfortable appearing  HENT:     Head: Normocephalic and atraumatic.     Right Ear: External ear normal.     Left Ear: External ear normal.     Nose: Nose normal.  Eyes:     Conjunctiva/sclera: Conjunctivae normal.  Cardiovascular:     Rate and Rhythm: Normal rate and regular rhythm.     Heart sounds: No murmur heard. Pulmonary:     Effort: Pulmonary effort is normal. No respiratory distress.     Breath sounds: Normal breath sounds.  Abdominal:     General: There is no distension.     Palpations: Abdomen is soft.     Tenderness: There is generalized abdominal tenderness. There is no right CVA tenderness, left CVA tenderness or guarding. Negative signs include Murphy's sign,  Rovsing's sign and McBurney's sign.  Musculoskeletal:        General: No swelling.     Cervical back: Neck supple.  Skin:    General: Skin is warm and dry.     Capillary Refill: Capillary refill takes less than 2 seconds.  Neurological:     General: No focal deficit present.     Mental Status: She is alert and oriented to person, place, and time.  Psychiatric:        Mood and Affect: Mood normal.     (all labs ordered are listed, but only abnormal results are displayed) Labs Reviewed  COMPREHENSIVE METABOLIC PANEL WITH GFR - Abnormal; Notable for the following  components:      Result Value   CO2 16 (*)    Glucose, Bld 134 (*)    Creatinine, Ser 1.12 (*)    AST 46 (*)    ALT 70 (*)    Anion gap 18 (*)    All other components within normal limits  URINALYSIS, ROUTINE W REFLEX MICROSCOPIC - Abnormal; Notable for the following components:   Color, Urine STRAW (*)    All other components within normal limits  LIPASE, BLOOD  CBC  HCG, SERUM, QUALITATIVE    EKG: None  Radiology: CT ABDOMEN PELVIS W CONTRAST Result Date: 08/22/2024 EXAM: CT ABDOMEN AND PELVIS WITH CONTRAST 08/22/2024 05:33:17 AM TECHNIQUE: CT of the abdomen and pelvis was performed with the administration of intravenous contrast. Multiplanar reformatted images are provided for review. Automated exposure control, iterative reconstruction, and/or weight-based adjustment of the mA/kV was utilized to reduce the radiation dose to as low as reasonably achievable. COMPARISON: 03/12/2024 CLINICAL HISTORY: Abdominal pain, acute, nonlocalized. N/V/D for several hours s/p eating chicken gizzards and a milkshake, general abdominal discomfort, wbc's 9.5, GFR>60, negative HCG 08/22/24, hx of congenital umbilical hernia, HTN and ovarian cyst, prev ct a/p 03/12/24. FINDINGS: LOWER CHEST: No acute abnormality. LIVER: 5 mm hypodensity along the dome of the right lobe, likely small cyst. No suspicious liver lesions. GALLBLADDER AND BILE DUCTS: Gallbladder is unremarkable. No biliary ductal dilatation. SPLEEN: No acute abnormality. PANCREAS: No acute abnormality. ADRENAL GLANDS: No acute abnormality. KIDNEYS, URETERS AND BLADDER: Bilateral pelvocaliectasis. No nephrolithiasis or mass. No urinary tract calculi. Urinary bladder appears normal. GI AND BOWEL: The appendix is visualized and appears normal. No pathologic dilatation of the large or small bowel loops. No bowel wall thickening or inflammation. PERITONEUM AND RETROPERITONEUM: Small volume of free fluid is noted within the pelvis. No focal fluid collections.  No free air. VASCULATURE: Aorta is normal in caliber. LYMPH NODES: No lymphadenopathy. REPRODUCTIVE ORGANS: Uterus appears normal. There is a left ovarian corpus luteal cyst, image 65/2. BONES AND SOFT TISSUES: Fat-containing umbilical hernia. No acute osseous abnormality. No focal soft tissue abnormality. IMPRESSION: 1. No acute findings in the abdomen or pelvis. 2. Left ovary corpus luteal cyst and small volume of free fluid within the pelvis likely physiologic. Electronically signed by: Waddell Calk MD 08/22/2024 05:42 AM EDT RP Workstation: [id number]     Procedures   Medications Ordered in the ED  ondansetron  (ZOFRAN -ODT) disintegrating tablet 4 mg (4 mg Oral Given 08/22/24 0102)  fentaNYL  (SUBLIMAZE ) injection 50 mcg (50 mcg Intravenous Given 08/22/24 0340)  ondansetron  (ZOFRAN ) injection 4 mg (4 mg Intravenous Given 08/22/24 0340)  sodium chloride  0.9 % bolus 1,000 mL (1,000 mLs Intravenous Bolus 08/22/24 0339)  iohexol  (OMNIPAQUE ) 300 MG/ML solution 100 mL (100 mLs Intravenous Contrast Given 08/22/24 0512)  morphine  (PF) 4  MG/ML injection 4 mg (4 mg Intravenous Given 08/22/24 0457)  dicyclomine  (BENTYL ) capsule 20 mg (20 mg Oral Given 08/22/24 0549)                                    Medical Decision Making Amount and/or Complexity of Data Reviewed Labs: ordered. Radiology: ordered.  Risk Prescription drug management.   This patient presents to the ED for concern of abdominal pain with nausea, vomiting, and diarrhea differential diagnosis includes colitis, diverticulitis, viral GI illness, choledocholithiasis, acute cholecystitis, pancreatitis, appendicitis    Additional history obtained   Additional history obtained from Electronic Medical Record External records from outside source obtained and reviewed including Care Everywhere   Lab Tests:  I Ordered, and personally interpreted labs.  The pertinent results include: CBC unremarkable, mildly elevated AST at 46, mildly  elevated ALT at 70, mildly elevated creatinine at 1.12, anion gap 18, reduced CO2 at 16, lipase 19, negative pregnancy, urine unremarkable   Imaging Studies ordered:  I ordered imaging studies including CT abdomen pelvis with contrast I independently visualized and interpreted imaging which showed no acute findings in the abdomen or pelvis I agree with the radiologist interpretation   Medicines ordered and prescription drug management:  I ordered medication including Bentyl , morphine , fentanyl , Zofran , IVF    I have reviewed the patients home medicines and have made adjustments as needed   Problem List / ED Course:  Considered for admission or further workup however patient's vital signs, physical exam, labs, and imaging are reassuring.  Patient was able to tolerate p.o. intake prior to discharge.  Patient given course of Zofran  for nausea and vomiting and Bentyl  for abdominal cramping outpatient.  Patient given return precautions.  I feel patient safe for discharge at this time.       Final diagnoses:  Nausea vomiting and diarrhea  Abdominal pain, unspecified abdominal location    ED Discharge Orders          Ordered    ondansetron  (ZOFRAN -ODT) 4 MG disintegrating tablet  Every 8 hours PRN        08/22/24 0552    dicyclomine  (BENTYL ) 20 MG tablet  2 times daily        08/22/24 0552               Paysley Poplar N, PA-C 08/22/24 [id number]    Palumbo, April, MD 08/22/24 [phone]

## 2024-08-22 NOTE — ED Notes (Signed)
 Patient aware that we need urine sample for testing, unable at this time. Pt given instruction on providing urine sample when able to do so.

## 2024-08-22 NOTE — ED Triage Notes (Signed)
 Pt endorses vomiting and diarrhea for about 4-5 hours. Last took a promethazine  suppository around 2245. Generalized abdominal discomfort. Last she had was chicken gizzards and a milkshake, and started vomiting immediately.

## 2024-08-23 LAB — URINE DRUG SCREEN
Amphetamines: NEGATIVE
Barbiturates: NEGATIVE
Benzodiazepines: NEGATIVE
Cocaine: NEGATIVE
Fentanyl: NEGATIVE
Methadone Scn, Ur: NEGATIVE
Opiates: NEGATIVE
Tetrahydrocannabinol: POSITIVE — AB

## 2024-08-23 MED ORDER — DIPHENHYDRAMINE HCL 50 MG/ML IJ SOLN
12.5000 mg | Freq: Once | INTRAMUSCULAR | Status: AC
  Administered 2024-08-23: 12.5 mg via INTRAVENOUS
  Filled 2024-08-23: qty 1

## 2024-08-23 MED ORDER — SODIUM CHLORIDE 0.9 % IV BOLUS
1000.0000 mL | Freq: Once | INTRAVENOUS | Status: AC
  Administered 2024-08-23: 1000 mL via INTRAVENOUS

## 2024-08-23 MED ORDER — HALOPERIDOL LACTATE 5 MG/ML IJ SOLN
2.0000 mg | Freq: Once | INTRAMUSCULAR | Status: AC
  Administered 2024-08-23: 2 mg via INTRAVENOUS
  Filled 2024-08-23: qty 1

## 2024-08-23 NOTE — ED Notes (Signed)
 Gave pt ice water RN said it was ok

## 2024-08-23 NOTE — Discharge Instructions (Signed)
 Your urine is positive for THC. This can cause severe vomiting. Much like an allergy to peanuts, you should not use anything containing THC.

## 2024-08-23 NOTE — ED Notes (Signed)
 Pt ambulatory to the restroom

## 2024-08-23 NOTE — ED Provider Notes (Signed)
 [place] EMERGENCY DEPARTMENT AT [facility] Provider Note   CSN: [id number] Arrival date & time: 08/22/24  2235     Patient presents with: Abdominal Pain   Jackie Newman is a 33 y.o. female.   33 yo female returns to the ER with ongoing vomiting and diarrhea with abdominal pain (generalized). Seen here yesterday for same, dc with bentyl  and zofran . Zofran  is not helping, thought she was allergic to bentyl  but is actually allergic to doxycyline. No fevers, travel, sick contacts, THC use, recent antibiotics. Numerous episodes of non bloody emesis. History of similar symptoms previously without cause found. No prior abdominal surgeries.        Prior to Admission medications   Medication Sig Start Date End Date Taking? Authorizing Provider  albuterol  (VENTOLIN  HFA) 108 (90 Base) MCG/ACT inhaler Inhale 1-2 puffs into the lungs every 6 (six) hours as needed for wheezing or shortness of breath. Patient taking differently: Inhale 2 puffs into the lungs every 6 (six) hours as needed for wheezing or shortness of breath. 05/02/20   Fredirick Glenys RAMAN, MD  dicyclomine  (BENTYL ) 20 MG tablet Take 1 tablet (20 mg total) by mouth 2 (two) times daily. 08/22/24   Keith, Kayla N, PA-C  HYDROcodone -acetaminophen  (NORCO/VICODIN) 5-325 MG tablet Take 1 tablet by mouth every 4 (four) hours as needed. 12/05/23   Odell Balls, PA-C  ibuprofen  (ADVIL ) 600 MG tablet Take 1 tablet (600 mg total) by mouth every 6 (six) hours as needed. 12/05/23   Odell Balls, PA-C  methocarbamol  (ROBAXIN -750) 750 MG tablet Take 1 tablet (750 mg total) by mouth 4 (four) times daily. 12/05/23   Odell Balls, PA-C  ondansetron  (ZOFRAN -ODT) 4 MG disintegrating tablet Take 1 tablet (4 mg total) by mouth every 8 (eight) hours as needed for nausea or vomiting. 01/14/24   Logan Martinis B, PA-C  ondansetron  (ZOFRAN -ODT) 4 MG disintegrating tablet Take 1 tablet (4 mg total) by mouth every 8 (eight) hours as needed for nausea or  vomiting. 08/22/24   Keith, Kayla N, PA-C  promethazine  (PHENERGAN ) 25 MG suppository Place 1 suppository (25 mg total) rectally every 6 (six) hours as needed for nausea or vomiting. 03/12/24   Prosperi, Christian H, PA-C    Allergies: Doxycycline     Review of Systems Negative except as per HPI Updated Vital Signs BP 117/70   Pulse 89   Temp 98.4 F (36.9 C) (Oral)   Resp 18   LMP 07/31/2024 Comment: negative HCG 08/22/24  SpO2 100%   Physical Exam Vitals and nursing note reviewed.  Constitutional:      General: She is not in acute distress.    Appearance: She is well-developed. She is not diaphoretic.  HENT:     Head: Normocephalic and atraumatic.  Cardiovascular:     Rate and Rhythm: Normal rate and regular rhythm.     Heart sounds: Normal heart sounds.  Pulmonary:     Effort: Pulmonary effort is normal.     Breath sounds: Normal breath sounds.  Abdominal:     Tenderness: There is generalized abdominal tenderness. There is no guarding or rebound.     Comments: Mild generalized abdominal discomfort   Skin:    General: Skin is warm and dry.     Findings: No erythema or rash.  Neurological:     Mental Status: She is alert and oriented to person, place, and time.  Psychiatric:        Behavior: Behavior normal.     (all labs ordered  are listed, but only abnormal results are displayed) Labs Reviewed  COMPREHENSIVE METABOLIC PANEL WITH GFR - Abnormal; Notable for the following components:      Result Value   CO2 15 (*)    Glucose, Bld 109 (*)    ALT 55 (*)    Anion gap 18 (*)    All other components within normal limits  URINALYSIS, ROUTINE W REFLEX MICROSCOPIC - Abnormal; Notable for the following components:   Specific Gravity, Urine <1.005 (*)    Ketones, ur 40 (*)    All other components within normal limits  URINE DRUG SCREEN - Abnormal; Notable for the following components:   Tetrahydrocannabinol POSITIVE (*)    All other components within normal limits   LIPASE, BLOOD  CBC    EKG: None  Radiology: CT ABDOMEN PELVIS W CONTRAST Result Date: 08/22/2024 EXAM: CT ABDOMEN AND PELVIS WITH CONTRAST 08/22/2024 05:33:17 AM TECHNIQUE: CT of the abdomen and pelvis was performed with the administration of intravenous contrast. Multiplanar reformatted images are provided for review. Automated exposure control, iterative reconstruction, and/or weight-based adjustment of the mA/kV was utilized to reduce the radiation dose to as low as reasonably achievable. COMPARISON: 03/12/2024 CLINICAL HISTORY: Abdominal pain, acute, nonlocalized. N/V/D for several hours s/p eating chicken gizzards and a milkshake, general abdominal discomfort, wbc's 9.5, GFR>60, negative HCG 08/22/24, hx of congenital umbilical hernia, HTN and ovarian cyst, prev ct a/p 03/12/24. FINDINGS: LOWER CHEST: No acute abnormality. LIVER: 5 mm hypodensity along the dome of the right lobe, likely small cyst. No suspicious liver lesions. GALLBLADDER AND BILE DUCTS: Gallbladder is unremarkable. No biliary ductal dilatation. SPLEEN: No acute abnormality. PANCREAS: No acute abnormality. ADRENAL GLANDS: No acute abnormality. KIDNEYS, URETERS AND BLADDER: Bilateral pelvocaliectasis. No nephrolithiasis or mass. No urinary tract calculi. Urinary bladder appears normal. GI AND BOWEL: The appendix is visualized and appears normal. No pathologic dilatation of the large or small bowel loops. No bowel wall thickening or inflammation. PERITONEUM AND RETROPERITONEUM: Small volume of free fluid is noted within the pelvis. No focal fluid collections. No free air. VASCULATURE: Aorta is normal in caliber. LYMPH NODES: No lymphadenopathy. REPRODUCTIVE ORGANS: Uterus appears normal. There is a left ovarian corpus luteal cyst, image 65/2. BONES AND SOFT TISSUES: Fat-containing umbilical hernia. No acute osseous abnormality. No focal soft tissue abnormality. IMPRESSION: 1. No acute findings in the abdomen or pelvis. 2. Left ovary  corpus luteal cyst and small volume of free fluid within the pelvis likely physiologic. Electronically signed by: Waddell Calk MD 08/22/2024 05:42 AM EDT RP Workstation: [id number]     Procedures   Medications Ordered in the ED  ondansetron  (ZOFRAN -ODT) disintegrating tablet 4 mg (has no administration in time range)  sodium chloride  0.9 % bolus 1,000 mL (0 mLs Intravenous Stopped 08/23/24 0357)  haloperidol  lactate (HALDOL ) injection 2 mg (2 mg Intravenous Given 08/23/24 0235)  diphenhydrAMINE  (BENADRYL ) injection 12.5 mg (12.5 mg Intravenous Given 08/23/24 0235)                                    Medical Decision Making Amount and/or Complexity of Data Reviewed Labs: ordered.  Risk Prescription drug management.   This patient presents to the ED for concern of abdominal pain, nausea, vomiting, diarrhea, this involves an extensive number of treatment options, and is a complaint that carries with it a high risk of complications and morbidity.  The differential diagnosis includes gastroparesis, hyperemesis, electrolyte or  metabolic abnormality, gastroenteritis   Co morbidities / Chronic conditions that complicate the patient evaluation  Ovarian cyst, hypertension, asthma, seizures, hyperemesis/cyclic vomiting   Additional history obtained:  Additional history obtained from EMR External records from outside source obtained and reviewed including prior labs and imaging on file.  Specifically labs obtained at yesterday's ER visit as well as CT abdomen pelvis which was unremarkable.   Lab Tests:  I Ordered, and personally interpreted labs.  The pertinent results include: CBC within normals.  CMP with bicarb of 15, gap of 18, not significant change compared to yesterday.  Lipase normal.  Urinalysis with small Mehta ketones.  UDS positive for THC.   Problem List / ED Course / Critical interventions / Medication management  33 yo female returns to the ER with ongoing abdominal pain  with n/v/d, seen here yesterday for same and dc with zofran  and bentyl . There was some confusion over the rx for bentyl  and her doxy allergy. Mild generalized tenderness on exam. Labs without significant changes. Prior UDS THC +, patient denies recent use. THC positive today. Symptoms resolved with haldol  and benadryl  and pt requesting DC. Advised against any future use of THC, can take zofran  and bentyl  as previously prescribed.  I ordered medication including Haldol , Benadryl , IV fluids Reevaluation of the patient after these medicines showed that the patient patient feeling better I have reviewed the patients home medicines and have made adjustments as needed   Social Determinants of Health:  Lives with family   Test / Admission - Considered:  Vomiting resolved, requesting discharge      Final diagnoses:  Cannabinoid hyperemesis syndrome    ED Discharge Orders     None          Beverley Leita DELENA DEVONNA 08/23/24 0516    Palumbo, April, MD 08/23/24 MANUS
